# Patient Record
Sex: Male | Born: 1965 | Race: Black or African American | Hispanic: No | Marital: Single | Smoking: Current every day smoker
Health system: Southern US, Community
[De-identification: ages and names within clinical notes are randomized; demographics above are authoritative.]

## PROBLEM LIST (undated history)

## (undated) DIAGNOSIS — D649 Anemia, unspecified: Secondary | ICD-10-CM

## (undated) DIAGNOSIS — E119 Type 2 diabetes mellitus without complications: Secondary | ICD-10-CM

## (undated) DIAGNOSIS — I10 Essential (primary) hypertension: Secondary | ICD-10-CM

## (undated) DIAGNOSIS — L02419 Cutaneous abscess of limb, unspecified: Secondary | ICD-10-CM

## (undated) DIAGNOSIS — I679 Cerebrovascular disease, unspecified: Secondary | ICD-10-CM

## (undated) DIAGNOSIS — I2510 Atherosclerotic heart disease of native coronary artery without angina pectoris: Secondary | ICD-10-CM

## (undated) DIAGNOSIS — I351 Nonrheumatic aortic (valve) insufficiency: Secondary | ICD-10-CM

## (undated) DIAGNOSIS — Z720 Tobacco use: Secondary | ICD-10-CM

## (undated) DIAGNOSIS — F209 Schizophrenia, unspecified: Secondary | ICD-10-CM

## (undated) DIAGNOSIS — R001 Bradycardia, unspecified: Secondary | ICD-10-CM

## (undated) DIAGNOSIS — J449 Chronic obstructive pulmonary disease, unspecified: Secondary | ICD-10-CM

## (undated) DIAGNOSIS — E785 Hyperlipidemia, unspecified: Secondary | ICD-10-CM

## (undated) DIAGNOSIS — E669 Obesity, unspecified: Secondary | ICD-10-CM

## (undated) HISTORY — DX: Obesity, unspecified: E66.9

## (undated) HISTORY — DX: Essential (primary) hypertension: I10

## (undated) HISTORY — DX: Bradycardia, unspecified: R00.1

## (undated) HISTORY — DX: Tobacco use: Z72.0

## (undated) HISTORY — DX: Cutaneous abscess of limb, unspecified: L02.419

## (undated) HISTORY — DX: Schizophrenia, unspecified: F20.9

## (undated) HISTORY — DX: Cerebrovascular disease, unspecified: I67.9

## (undated) HISTORY — DX: Atherosclerotic heart disease of native coronary artery without angina pectoris: I25.10

## (undated) HISTORY — DX: Type 2 diabetes mellitus without complications: E11.9

## (undated) HISTORY — DX: Chronic obstructive pulmonary disease, unspecified: J44.9

## (undated) HISTORY — DX: Anemia, unspecified: D64.9

## (undated) HISTORY — DX: Nonrheumatic aortic (valve) insufficiency: I35.1

## (undated) HISTORY — DX: Hyperlipidemia, unspecified: E78.5

---
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
## 2006-12-24 ENCOUNTER — Inpatient Hospital Stay (HOSPITAL_COMMUNITY): Admission: EM | Admit: 2006-12-24 | Discharge: 2006-12-27 | Payer: Self-pay | Admitting: Emergency Medicine

## 2006-12-24 ENCOUNTER — Ambulatory Visit: Payer: Self-pay | Admitting: Internal Medicine

## 2006-12-25 ENCOUNTER — Ambulatory Visit: Payer: Self-pay | Admitting: Gastroenterology

## 2006-12-26 ENCOUNTER — Ambulatory Visit: Payer: Self-pay | Admitting: Gastroenterology

## 2007-12-02 ENCOUNTER — Emergency Department (HOSPITAL_COMMUNITY): Admission: EM | Admit: 2007-12-02 | Discharge: 2007-12-02 | Payer: Self-pay | Admitting: Emergency Medicine

## 2008-02-26 ENCOUNTER — Emergency Department (HOSPITAL_COMMUNITY): Admission: EM | Admit: 2008-02-26 | Discharge: 2008-02-26 | Payer: Self-pay | Admitting: Emergency Medicine

## 2008-07-28 ENCOUNTER — Inpatient Hospital Stay (HOSPITAL_COMMUNITY): Admission: EM | Admit: 2008-07-28 | Discharge: 2008-07-30 | Payer: Self-pay | Admitting: Cardiology

## 2008-07-28 ENCOUNTER — Encounter: Payer: Self-pay | Admitting: Cardiology

## 2009-04-04 DIAGNOSIS — L02419 Cutaneous abscess of limb, unspecified: Secondary | ICD-10-CM

## 2009-04-04 HISTORY — DX: Cutaneous abscess of limb, unspecified: L02.419

## 2009-05-13 ENCOUNTER — Inpatient Hospital Stay (HOSPITAL_COMMUNITY): Admission: EM | Admit: 2009-05-13 | Discharge: 2009-05-18 | Payer: Self-pay | Admitting: Emergency Medicine

## 2009-11-07 ENCOUNTER — Emergency Department (HOSPITAL_COMMUNITY): Admission: EM | Admit: 2009-11-07 | Discharge: 2009-11-07 | Payer: Self-pay | Admitting: Emergency Medicine

## 2009-11-09 ENCOUNTER — Inpatient Hospital Stay (HOSPITAL_COMMUNITY): Admission: EM | Admit: 2009-11-09 | Discharge: 2009-11-11 | Payer: Self-pay | Admitting: Emergency Medicine

## 2010-02-22 ENCOUNTER — Ambulatory Visit: Payer: Self-pay | Admitting: Cardiology

## 2010-04-30 ENCOUNTER — Ambulatory Visit (HOSPITAL_COMMUNITY)
Admission: RE | Admit: 2010-04-30 | Discharge: 2010-04-30 | Payer: Self-pay | Source: Home / Self Care | Attending: Pulmonary Disease | Admitting: Pulmonary Disease

## 2010-06-18 LAB — DIFFERENTIAL
Basophils Absolute: 0 10*3/uL (ref 0.0–0.1)
Basophils Absolute: 0 10*3/uL (ref 0.0–0.1)
Basophils Absolute: 0 10*3/uL (ref 0.0–0.1)
Basophils Relative: 0 % (ref 0–1)
Basophils Relative: 0 % (ref 0–1)
Basophils Relative: 0 % (ref 0–1)
Eosinophils Absolute: 0.4 10*3/uL (ref 0.0–0.7)
Eosinophils Absolute: 0.5 10*3/uL (ref 0.0–0.7)
Eosinophils Absolute: 0.5 10*3/uL (ref 0.0–0.7)
Eosinophils Relative: 2 % (ref 0–5)
Eosinophils Relative: 3 % (ref 0–5)
Eosinophils Relative: 4 % (ref 0–5)
Lymphocytes Relative: 11 % — ABNORMAL LOW (ref 12–46)
Lymphocytes Relative: 4 % — ABNORMAL LOW (ref 12–46)
Lymphocytes Relative: 8 % — ABNORMAL LOW (ref 12–46)
Lymphs Abs: 0.8 10*3/uL (ref 0.7–4.0)
Lymphs Abs: 1.2 10*3/uL (ref 0.7–4.0)
Lymphs Abs: 1.3 10*3/uL (ref 0.7–4.0)
Monocytes Absolute: 0.6 10*3/uL (ref 0.1–1.0)
Monocytes Absolute: 0.6 10*3/uL (ref 0.1–1.0)
Monocytes Absolute: 0.6 10*3/uL (ref 0.1–1.0)
Monocytes Relative: 3 % (ref 3–12)
Monocytes Relative: 3 % (ref 3–12)
Monocytes Relative: 5 % (ref 3–12)
Neutro Abs: 14.4 10*3/uL — ABNORMAL HIGH (ref 1.7–7.7)
Neutro Abs: 17.4 10*3/uL — ABNORMAL HIGH (ref 1.7–7.7)
Neutro Abs: 9.2 10*3/uL — ABNORMAL HIGH (ref 1.7–7.7)
Neutrophils Relative %: 80 % — ABNORMAL HIGH (ref 43–77)
Neutrophils Relative %: 86 % — ABNORMAL HIGH (ref 43–77)
Neutrophils Relative %: 91 % — ABNORMAL HIGH (ref 43–77)

## 2010-06-18 LAB — GLUCOSE, CAPILLARY
Glucose-Capillary: 102 mg/dL — ABNORMAL HIGH (ref 70–99)
Glucose-Capillary: 110 mg/dL — ABNORMAL HIGH (ref 70–99)
Glucose-Capillary: 121 mg/dL — ABNORMAL HIGH (ref 70–99)
Glucose-Capillary: 122 mg/dL — ABNORMAL HIGH (ref 70–99)
Glucose-Capillary: 126 mg/dL — ABNORMAL HIGH (ref 70–99)
Glucose-Capillary: 129 mg/dL — ABNORMAL HIGH (ref 70–99)
Glucose-Capillary: 152 mg/dL — ABNORMAL HIGH (ref 70–99)
Glucose-Capillary: 153 mg/dL — ABNORMAL HIGH (ref 70–99)
Glucose-Capillary: 162 mg/dL — ABNORMAL HIGH (ref 70–99)

## 2010-06-18 LAB — CBC
HCT: 37.5 % — ABNORMAL LOW (ref 39.0–52.0)
HCT: 38.6 % — ABNORMAL LOW (ref 39.0–52.0)
HCT: 38.7 % — ABNORMAL LOW (ref 39.0–52.0)
Hemoglobin: 12.5 g/dL — ABNORMAL LOW (ref 13.0–17.0)
Hemoglobin: 12.5 g/dL — ABNORMAL LOW (ref 13.0–17.0)
Hemoglobin: 12.6 g/dL — ABNORMAL LOW (ref 13.0–17.0)
MCH: 27.7 pg (ref 26.0–34.0)
MCH: 27.7 pg (ref 26.0–34.0)
MCH: 28.6 pg (ref 26.0–34.0)
MCHC: 32.4 g/dL (ref 30.0–36.0)
MCHC: 32.4 g/dL (ref 30.0–36.0)
MCHC: 33.3 g/dL (ref 30.0–36.0)
MCV: 85.5 fL (ref 78.0–100.0)
MCV: 85.5 fL (ref 78.0–100.0)
MCV: 85.8 fL (ref 78.0–100.0)
Platelets: 161 10*3/uL (ref 150–400)
Platelets: 177 10*3/uL (ref 150–400)
Platelets: 211 10*3/uL (ref 150–400)
RBC: 4.37 MIL/uL (ref 4.22–5.81)
RBC: 4.51 MIL/uL (ref 4.22–5.81)
RBC: 4.53 MIL/uL (ref 4.22–5.81)
RDW: 15.6 % — ABNORMAL HIGH (ref 11.5–15.5)
RDW: 15.8 % — ABNORMAL HIGH (ref 11.5–15.5)
RDW: 15.9 % — ABNORMAL HIGH (ref 11.5–15.5)
WBC: 11.4 10*3/uL — ABNORMAL HIGH (ref 4.0–10.5)
WBC: 16.8 10*3/uL — ABNORMAL HIGH (ref 4.0–10.5)
WBC: 19.2 10*3/uL — ABNORMAL HIGH (ref 4.0–10.5)

## 2010-06-18 LAB — BASIC METABOLIC PANEL
BUN: 10 mg/dL (ref 6–23)
BUN: 9 mg/dL (ref 6–23)
CO2: 23 mEq/L (ref 19–32)
CO2: 27 mEq/L (ref 19–32)
Calcium: 9.7 mg/dL (ref 8.4–10.5)
Calcium: 9.8 mg/dL (ref 8.4–10.5)
Chloride: 107 mEq/L (ref 96–112)
Chloride: 109 mEq/L (ref 96–112)
Creatinine, Ser: 1.1 mg/dL (ref 0.4–1.5)
Creatinine, Ser: 1.41 mg/dL (ref 0.4–1.5)
GFR calc Af Amer: >60 mL/min (ref >60)
GFR calc Af Amer: >60 mL/min (ref >60)
GFR calc non Af Amer: 54 mL/min — ABNORMAL LOW (ref >60)
GFR calc non Af Amer: >60 mL/min (ref >60)
Glucose, Bld: 103 mg/dL — ABNORMAL HIGH (ref 70–99)
Glucose, Bld: 118 mg/dL — ABNORMAL HIGH (ref 70–99)
Potassium: 3.8 mEq/L (ref 3.5–5.1)
Potassium: 3.9 mEq/L (ref 3.5–5.1)
Sodium: 138 mEq/L (ref 135–145)
Sodium: 140 mEq/L (ref 135–145)

## 2010-06-18 LAB — LITHIUM LEVEL: Lithium Lvl: 0.58 mEq/L — ABNORMAL LOW (ref 0.80–1.40)

## 2010-06-18 LAB — VANCOMYCIN, TROUGH: Vancomycin Tr: 10.3 ug/mL (ref 10.0–20.0)

## 2010-06-23 LAB — GLUCOSE, CAPILLARY
Glucose-Capillary: 130 mg/dL — ABNORMAL HIGH (ref 70–99)
Glucose-Capillary: 134 mg/dL — ABNORMAL HIGH (ref 70–99)
Glucose-Capillary: 148 mg/dL — ABNORMAL HIGH (ref 70–99)
Glucose-Capillary: 150 mg/dL — ABNORMAL HIGH (ref 70–99)
Glucose-Capillary: 161 mg/dL — ABNORMAL HIGH (ref 70–99)
Glucose-Capillary: 186 mg/dL — ABNORMAL HIGH (ref 70–99)
Glucose-Capillary: 188 mg/dL — ABNORMAL HIGH (ref 70–99)
Glucose-Capillary: 193 mg/dL — ABNORMAL HIGH (ref 70–99)
Glucose-Capillary: 202 mg/dL — ABNORMAL HIGH (ref 70–99)
Glucose-Capillary: 207 mg/dL — ABNORMAL HIGH (ref 70–99)
Glucose-Capillary: 221 mg/dL — ABNORMAL HIGH (ref 70–99)
Glucose-Capillary: 223 mg/dL — ABNORMAL HIGH (ref 70–99)
Glucose-Capillary: 226 mg/dL — ABNORMAL HIGH (ref 70–99)
Glucose-Capillary: 230 mg/dL — ABNORMAL HIGH (ref 70–99)
Glucose-Capillary: 272 mg/dL — ABNORMAL HIGH (ref 70–99)
Glucose-Capillary: 275 mg/dL — ABNORMAL HIGH (ref 70–99)
Glucose-Capillary: 277 mg/dL — ABNORMAL HIGH (ref 70–99)
Glucose-Capillary: 280 mg/dL — ABNORMAL HIGH (ref 70–99)
Glucose-Capillary: 283 mg/dL — ABNORMAL HIGH (ref 70–99)
Glucose-Capillary: 304 mg/dL — ABNORMAL HIGH (ref 70–99)
Glucose-Capillary: 327 mg/dL — ABNORMAL HIGH (ref 70–99)
Glucose-Capillary: 328 mg/dL — ABNORMAL HIGH (ref 70–99)
Glucose-Capillary: 331 mg/dL — ABNORMAL HIGH (ref 70–99)
Glucose-Capillary: 344 mg/dL — ABNORMAL HIGH (ref 70–99)
Glucose-Capillary: 344 mg/dL — ABNORMAL HIGH (ref 70–99)
Glucose-Capillary: 350 mg/dL — ABNORMAL HIGH (ref 70–99)
Glucose-Capillary: 353 mg/dL — ABNORMAL HIGH (ref 70–99)
Glucose-Capillary: 382 mg/dL — ABNORMAL HIGH (ref 70–99)
Glucose-Capillary: 489 mg/dL — ABNORMAL HIGH (ref 70–99)
Glucose-Capillary: >600 mg/dL (ref 70–99)
Glucose-Capillary: >600 mg/dL (ref 70–99)
Glucose-Capillary: >600 mg/dL (ref 70–99)
Glucose-Capillary: >600 mg/dL (ref 70–99)

## 2010-06-23 LAB — DIFFERENTIAL
Basophils Absolute: 0 10*3/uL (ref 0.0–0.1)
Basophils Absolute: 0 10*3/uL (ref 0.0–0.1)
Basophils Relative: 0 % (ref 0–1)
Basophils Relative: 0 % (ref 0–1)
Eosinophils Absolute: 0 10*3/uL (ref 0.0–0.7)
Eosinophils Absolute: 0.2 10*3/uL (ref 0.0–0.7)
Eosinophils Relative: 0 % (ref 0–5)
Eosinophils Relative: 2 % (ref 0–5)
Lymphocytes Relative: 18 % (ref 12–46)
Lymphocytes Relative: 9 % — ABNORMAL LOW (ref 12–46)
Lymphs Abs: 0.7 10*3/uL (ref 0.7–4.0)
Lymphs Abs: 1.7 10*3/uL (ref 0.7–4.0)
Monocytes Absolute: 0.3 10*3/uL (ref 0.1–1.0)
Monocytes Absolute: 0.7 10*3/uL (ref 0.1–1.0)
Monocytes Relative: 4 % (ref 3–12)
Monocytes Relative: 7 % (ref 3–12)
Neutro Abs: 6.9 10*3/uL (ref 1.7–7.7)
Neutro Abs: 6.9 10*3/uL (ref 1.7–7.7)
Neutrophils Relative %: 73 % (ref 43–77)
Neutrophils Relative %: 87 % — ABNORMAL HIGH (ref 43–77)

## 2010-06-23 LAB — BASIC METABOLIC PANEL
BUN: 27 mg/dL — ABNORMAL HIGH (ref 6–23)
BUN: 43 mg/dL — ABNORMAL HIGH (ref 6–23)
CO2: 25 mEq/L (ref 19–32)
CO2: 29 mEq/L (ref 19–32)
Calcium: 9.2 mg/dL (ref 8.4–10.5)
Calcium: 9.3 mg/dL (ref 8.4–10.5)
Chloride: 111 mEq/L (ref 96–112)
Chloride: 114 mEq/L — ABNORMAL HIGH (ref 96–112)
Creatinine, Ser: 1.7 mg/dL — ABNORMAL HIGH (ref 0.4–1.5)
Creatinine, Ser: 2.55 mg/dL — ABNORMAL HIGH (ref 0.4–1.5)
GFR calc Af Amer: 33 mL/min — ABNORMAL LOW (ref >60)
GFR calc Af Amer: 53 mL/min — ABNORMAL LOW (ref >60)
GFR calc non Af Amer: 27 mL/min — ABNORMAL LOW (ref >60)
GFR calc non Af Amer: 44 mL/min — ABNORMAL LOW (ref >60)
Glucose, Bld: 140 mg/dL — ABNORMAL HIGH (ref 70–99)
Glucose, Bld: 206 mg/dL — ABNORMAL HIGH (ref 70–99)
Potassium: 3.2 mEq/L — ABNORMAL LOW (ref 3.5–5.1)
Potassium: 3.6 mEq/L (ref 3.5–5.1)
Sodium: 142 mEq/L (ref 135–145)
Sodium: 149 mEq/L — ABNORMAL HIGH (ref 135–145)

## 2010-06-23 LAB — COMPREHENSIVE METABOLIC PANEL
ALT: 23 U/L (ref 0–53)
AST: 13 U/L (ref 0–37)
Albumin: 4.1 g/dL (ref 3.5–5.2)
Alkaline Phosphatase: 159 U/L — ABNORMAL HIGH (ref 39–117)
BUN: 41 mg/dL — ABNORMAL HIGH (ref 6–23)
CO2: 24 mEq/L (ref 19–32)
Calcium: 10.2 mg/dL (ref 8.4–10.5)
Chloride: 94 mEq/L — ABNORMAL LOW (ref 96–112)
Creatinine, Ser: 2.83 mg/dL — ABNORMAL HIGH (ref 0.4–1.5)
GFR calc Af Amer: 29 mL/min — ABNORMAL LOW (ref >60)
GFR calc non Af Amer: 24 mL/min — ABNORMAL LOW (ref >60)
Glucose, Bld: 1160 mg/dL (ref 70–99)
Potassium: 5.4 mEq/L — ABNORMAL HIGH (ref 3.5–5.1)
Sodium: 133 mEq/L — ABNORMAL LOW (ref 135–145)
Total Bilirubin: 1.3 mg/dL — ABNORMAL HIGH (ref 0.3–1.2)
Total Protein: 7.9 g/dL (ref 6.0–8.3)

## 2010-06-23 LAB — CK TOTAL AND CKMB (NOT AT ARMC)
CK, MB: 1.5 ng/mL (ref 0.3–4.0)
Relative Index: INVALID (ref 0.0–2.5)
Total CK: 90 U/L (ref 7–232)

## 2010-06-23 LAB — MRSA PCR SCREENING: MRSA by PCR: NEGATIVE

## 2010-06-23 LAB — CBC
HCT: 37.3 % — ABNORMAL LOW (ref 39.0–52.0)
HCT: 47 % (ref 39.0–52.0)
Hemoglobin: 12.2 g/dL — ABNORMAL LOW (ref 13.0–17.0)
Hemoglobin: 14.7 g/dL (ref 13.0–17.0)
MCHC: 31.3 g/dL (ref 30.0–36.0)
MCHC: 32.8 g/dL (ref 30.0–36.0)
MCV: 85.4 fL (ref 78.0–100.0)
MCV: 87 fL (ref 78.0–100.0)
Platelets: 182 10*3/uL (ref 150–400)
Platelets: 215 10*3/uL (ref 150–400)
RBC: 4.37 MIL/uL (ref 4.22–5.81)
RBC: 5.41 MIL/uL (ref 4.22–5.81)
RDW: 14.1 % (ref 11.5–15.5)
RDW: 14.3 % (ref 11.5–15.5)
WBC: 7.9 10*3/uL (ref 4.0–10.5)
WBC: 9.5 10*3/uL (ref 4.0–10.5)

## 2010-06-23 LAB — URINE CULTURE: Colony Count: 70000

## 2010-06-23 LAB — URINALYSIS, ROUTINE W REFLEX MICROSCOPIC
Bilirubin Urine: NEGATIVE
Glucose, UA: >1000 mg/dL — AB
Leukocytes, UA: NEGATIVE
Nitrite: NEGATIVE
Specific Gravity, Urine: 1.005 (ref 1.005–1.030)
Urobilinogen, UA: 0.2 mg/dL (ref 0.0–1.0)
pH: 5.5 (ref 5.0–8.0)

## 2010-06-23 LAB — URINE MICROSCOPIC-ADD ON

## 2010-06-23 LAB — CULTURE, BLOOD (ROUTINE X 2)
Culture: NO GROWTH
Culture: NO GROWTH
Report Status: 2142011
Report Status: 2142011

## 2010-06-23 LAB — APTT: aPTT: 24 seconds (ref 24–37)

## 2010-06-23 LAB — GLUCOSE, RANDOM
Glucose, Bld: 1086 mg/dL (ref 70–99)
Glucose, Bld: 902 mg/dL (ref 70–99)

## 2010-06-23 LAB — KETONES, QUALITATIVE

## 2010-06-23 LAB — LITHIUM LEVEL: Lithium Lvl: 1.66 mEq/L — ABNORMAL HIGH (ref 0.80–1.40)

## 2010-06-23 LAB — PROTIME-INR
INR: 1.09 (ref 0.00–1.49)
Prothrombin Time: 14 seconds (ref 11.6–15.2)

## 2010-06-23 LAB — TROPONIN I: Troponin I: 0.04 ng/mL (ref 0.00–0.06)

## 2010-07-14 LAB — PLATELET COUNT: Platelets: 206 10*3/uL (ref 150–400)

## 2010-07-14 LAB — COMPREHENSIVE METABOLIC PANEL
ALT: 29 U/L (ref 0–53)
AST: 130 U/L — ABNORMAL HIGH (ref 0–37)
Albumin: 3.6 g/dL (ref 3.5–5.2)
Alkaline Phosphatase: 104 U/L (ref 39–117)
BUN: 8 mg/dL (ref 6–23)
CO2: 27 mEq/L (ref 19–32)
Calcium: 9.8 mg/dL (ref 8.4–10.5)
Chloride: 99 mEq/L (ref 96–112)
Creatinine, Ser: 1.03 mg/dL (ref 0.4–1.5)
GFR calc Af Amer: >60 mL/min (ref >60)
GFR calc non Af Amer: >60 mL/min (ref >60)
Glucose, Bld: 145 mg/dL — ABNORMAL HIGH (ref 70–99)
Potassium: 3.7 mEq/L (ref 3.5–5.1)
Sodium: 135 mEq/L (ref 135–145)
Total Bilirubin: 0.8 mg/dL (ref 0.3–1.2)
Total Protein: 6.6 g/dL (ref 6.0–8.3)

## 2010-07-14 LAB — GLUCOSE, CAPILLARY
Glucose-Capillary: 107 mg/dL — ABNORMAL HIGH (ref 70–99)
Glucose-Capillary: 110 mg/dL — ABNORMAL HIGH (ref 70–99)
Glucose-Capillary: 123 mg/dL — ABNORMAL HIGH (ref 70–99)
Glucose-Capillary: 125 mg/dL — ABNORMAL HIGH (ref 70–99)
Glucose-Capillary: 132 mg/dL — ABNORMAL HIGH (ref 70–99)
Glucose-Capillary: 132 mg/dL — ABNORMAL HIGH (ref 70–99)
Glucose-Capillary: 132 mg/dL — ABNORMAL HIGH (ref 70–99)
Glucose-Capillary: 138 mg/dL — ABNORMAL HIGH (ref 70–99)

## 2010-07-14 LAB — BASIC METABOLIC PANEL
BUN: 10 mg/dL (ref 6–23)
BUN: 8 mg/dL (ref 6–23)
CO2: 29 mEq/L (ref 19–32)
CO2: 28 mEq/L (ref 19–32)
Calcium: 9.5 mg/dL (ref 8.4–10.5)
Calcium: 9.4 mg/dL (ref 8.4–10.5)
Chloride: 97 mEq/L (ref 96–112)
Chloride: 103 mEq/L (ref 96–112)
Creatinine, Ser: 1.35 mg/dL (ref 0.4–1.5)
Creatinine, Ser: 1.11 mg/dL (ref 0.4–1.5)
GFR calc Af Amer: >60 mL/min (ref >60)
GFR calc Af Amer: >60 mL/min (ref >60)
GFR calc non Af Amer: 57 mL/min — ABNORMAL LOW (ref >60)
GFR calc non Af Amer: >60 mL/min (ref >60)
Glucose, Bld: 183 mg/dL — ABNORMAL HIGH (ref 70–99)
Glucose, Bld: 116 mg/dL — ABNORMAL HIGH (ref 70–99)
Potassium: 3.1 mEq/L — ABNORMAL LOW (ref 3.5–5.1)
Potassium: 3.5 mEq/L (ref 3.5–5.1)
Sodium: 135 mEq/L (ref 135–145)
Sodium: 137 mEq/L (ref 135–145)

## 2010-07-14 LAB — CBC
HCT: 36.8 % — ABNORMAL LOW (ref 39.0–52.0)
HCT: 34.2 % — ABNORMAL LOW (ref 39.0–52.0)
Hemoglobin: 12.4 g/dL — ABNORMAL LOW (ref 13.0–17.0)
Hemoglobin: 11.2 g/dL — ABNORMAL LOW (ref 13.0–17.0)
MCHC: 33.8 g/dL (ref 30.0–36.0)
MCHC: 32.7 g/dL (ref 30.0–36.0)
MCV: 84.4 fL (ref 78.0–100.0)
MCV: 85.6 fL (ref 78.0–100.0)
Platelets: 205 10*3/uL (ref 150–400)
Platelets: 175 10*3/uL (ref 150–400)
RBC: 4.36 MIL/uL (ref 4.22–5.81)
RBC: 3.99 MIL/uL — ABNORMAL LOW (ref 4.22–5.81)
RDW: 16 % — ABNORMAL HIGH (ref 11.5–15.5)
RDW: 15.4 % (ref 11.5–15.5)
WBC: 8.6 10*3/uL (ref 4.0–10.5)
WBC: 8.7 10*3/uL (ref 4.0–10.5)

## 2010-07-14 LAB — DIFFERENTIAL
Basophils Absolute: 0 10*3/uL (ref 0.0–0.1)
Basophils Relative: 0 % (ref 0–1)
Eosinophils Absolute: 0.1 10*3/uL (ref 0.0–0.7)
Eosinophils Relative: 1 % (ref 0–5)
Lymphocytes Relative: 13 % (ref 12–46)
Lymphs Abs: 1.1 10*3/uL (ref 0.7–4.0)
Monocytes Absolute: 0.4 10*3/uL (ref 0.1–1.0)
Monocytes Relative: 4 % (ref 3–12)
Neutro Abs: 7.1 10*3/uL (ref 1.7–7.7)
Neutrophils Relative %: 82 % — ABNORMAL HIGH (ref 43–77)

## 2010-07-14 LAB — HEMOGLOBIN A1C
Hgb A1c MFr Bld: 6.4 % — ABNORMAL HIGH (ref 4.6–6.1)
Mean Plasma Glucose: 137 mg/dL

## 2010-07-14 LAB — LIPID PANEL
Cholesterol: 162 mg/dL (ref 0–200)
HDL: 28 mg/dL — ABNORMAL LOW (ref >39)
LDL Cholesterol: 83 mg/dL (ref 0–99)
Total CHOL/HDL Ratio: 5.8 RATIO
Triglycerides: 256 mg/dL — ABNORMAL HIGH (ref <150)
VLDL: 51 mg/dL — ABNORMAL HIGH (ref 0–40)

## 2010-07-14 LAB — POCT CARDIAC MARKERS
CKMB, poc: 13.5 ng/mL (ref 1.0–8.0)
CKMB, poc: 3.7 ng/mL (ref 1.0–8.0)
Myoglobin, poc: 457 ng/mL (ref 12–200)
Myoglobin, poc: >500 ng/mL (ref 12–200)
Troponin i, poc: 0.14 ng/mL — ABNORMAL HIGH (ref 0.00–0.09)
Troponin i, poc: 1.42 ng/mL (ref 0.00–0.09)

## 2010-07-14 LAB — CARDIAC PANEL(CRET KIN+CKTOT+MB+TROPI)
CK, MB: 66.9 ng/mL — ABNORMAL HIGH (ref 0.3–4.0)
Relative Index: 6.3 — ABNORMAL HIGH (ref 0.0–2.5)
Total CK: 1062 U/L — ABNORMAL HIGH (ref 7–232)
Troponin I: 17.38 ng/mL (ref 0.00–0.06)

## 2010-07-14 LAB — D-DIMER, QUANTITATIVE (NOT AT ARMC): D-Dimer, Quant: 0.55 ug/mL-FEU — ABNORMAL HIGH (ref 0.00–0.48)

## 2010-07-14 LAB — TSH: TSH: 0.901 u[IU]/mL (ref 0.350–4.500)

## 2010-07-14 LAB — APTT: aPTT: 39 seconds — ABNORMAL HIGH (ref 24–37)

## 2010-07-14 LAB — MAGNESIUM: Magnesium: 1.9 mg/dL (ref 1.5–2.5)

## 2010-07-14 LAB — PROTIME-INR
INR: 1.1 (ref 0.00–1.49)
Prothrombin Time: 14.1 seconds (ref 11.6–15.2)

## 2010-08-17 NOTE — Group Therapy Note (Signed)
NAMENOCHUM, FENTER            ACCOUNT NO.:  [id number]   MEDICAL RECORD NO.:  [id number]          PATIENT TYPE:  INP   LOCATION:  A304                          FACILITY:  [facility]   PHYSICIAN:  Edward L. Luan Pulling, M.D.DATE OF BIRTH:  02-14-1966   DATE OF PROCEDURE:  DATE OF DISCHARGE:                                 PROGRESS NOTE   [name] is better this morning.  He actually looked pretty good  yesterday, but he had more fever, and this morning in fact he still has  a temperature of 99.3, but he says he feels great and wants to go home.  I think his fevers part of the viral process with his mesenteric  adenitis.   EXAM:  This morning shows he is awake and alert, ambulating.  His chest is clear.  His heart is regular without gallop.  His abdomen is soft.  Temperature 99.3, pulse 98, respirations 20, blood pressure 167/99.   ASSESSMENT:  He has got mesenteric adenitis plan.   IS:  To discharge home.  Please see discharge summary for details.      Edward L. Luan Pulling, M.D.  Electronically Signed     ELH/MEDQ  D:  12/27/2006  T:  12/27/2006  Job:  [id number]

## 2010-08-17 NOTE — Group Therapy Note (Signed)
[name], [name]            ACCOUNT NO.:  [id number]   MEDICAL RECORD NO.:  [id number]          PATIENT TYPE:  INP   LOCATION:  A304                          FACILITY:  [facility]   PHYSICIAN:  Edward L. Luan Pulling, M.D.DATE OF BIRTH:  [birth year]   DATE OF PROCEDURE:  DATE OF DISCHARGE:                                 PROGRESS NOTE   PROBLEM:  Mesenteric adenitis, schizophrenia.   SUBJECTIVE:  [name] is about the same.  He has no new complaints.  He says he is hungry.   EXAM:  Shows temperature is 98.8, pulse 97, respirations 20, blood  pressure 156/80.  His abdomen is much softer than yesterday and he does not seem to be  nearly as tender.   ASSESSMENT:  He seems to be doing better.   PLAN:  To go ahead and try to increase his diet and follow.      Edward L. Luan Pulling, M.D.  Electronically Signed     ELH/MEDQ  D:  12/26/2006  T:  12/26/2006  Job:  [id number]

## 2010-08-17 NOTE — H&P (Signed)
NAMEANDRES, VEST NO.:  [id number]   MEDICAL RECORD NO.:  [id number]          PATIENT TYPE:  INP   LOCATION:  2904                         FACILITY:  [place]   PHYSICIAN:  [name], M.D.  DATE OF BIRTH:  [birth year]   DATE OF ADMISSION:  07/28/2008  DATE OF DISCHARGE:                              HISTORY & PHYSICAL   HISTORY OF PRESENT ILLNESS:  Mr. Whitebread is a 45 year old black male  with history of schizophrenia.  He is currently living in a group home.  He presented to the [facility] Emergency Department last night after  acute onset of mid substernal chest pain at 11:30 p.m.  Pain was  described as midsternal heaviness and pain without radiation.  It was  associated with shortness of breath and nausea, but no diaphoresis.  His  pain initially was relieved in the emergency department with IV  nitroglycerin, but has since recurred.  He now has grade 2/10 pain.  He  reports a 48-month history of similar substernal chest pain when he walks  to his mother's house.  This was relieved with rest.  He has no known  history of coronary disease.  He does have a history of hypertension.   His past medical history is significant for schizophrenia, history of  hypertension, and history of previous hernia repair.   He has no known allergies.   His current medications include lisinopril 20 mg per day,  hydrochlorothiazide 12.5 mg daily, metoprolol 50 mg per day, Zyprexa 20  mg per day, and lithium 300 mg per day.  He is also on Haldol at unknown  dose.   SOCIAL HISTORY:  The patient lives in a group home.  His mother lives  nearby.  He is single and has no children.  He is unemployed.  He smokes  one-half pack per day.  He denies alcohol use.   FAMILY HISTORY:  The patient is unable to give me his family history.   REVIEW OF SYSTEMS:  He states his appetite has been good.  He has had no  bowel or bladder complaints.  He denies any edema, orthopnea, or PND.  He has no bleeding history.  All other review of systems are reviewed  and are negative.   PHYSICAL EXAMINATION:  GENERAL:  The patient is an obese black male, in  no apparent distress.  VITAL SIGNS:  Blood pressure 146/72, pulse is 93 and regular.  He is  afebrile.  Sats 100% on 2 L nasal cannula.  HEENT:  He is normocephalic, atraumatic.  Pupils are equal, round,  reactive to light and accommodation.  Extraocular movements are full.  Sclerae clear.  Oropharynx is clear with poor dental hygiene.  NECK:  Supple without JVD, adenopathy, thyromegaly, or bruits.  LUNGS:  Clear.  CARDIAC:  Regular rate and rhythm with a grade 1/6 systolic murmur at  the left sternal border.  ABDOMEN:  Obese, soft, nontender.  Bowel sounds are positive.  There is  no hepatosplenomegaly.  Femoral and pedal pulses are 2+ and symmetric.  He has no lower extremity edema or cyanosis.  There  is no evidence of  phlebitis.  SKIN:  Warm and dry.  NEUROLOGIC:  The patient is alert and oriented to himself and place.  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  His cranial nerves II through  XII are intact.  He has no focal motor findings.   LABORATORY DATA:  ECG shows normal sinus rhythm with an LVH.  There is  slight ST elevation in V1 and V2, which may be repolarization  abnormality.  There is no serial change today.  His chest x-ray shows no  active disease.  His white count is 8600, hemoglobin 12.4, hematocrit  36.8, platelets 205,000.  Sodium is 135, potassium is 3.1, chloride 97,  CO2 of 29, BUN 10, creatinine 1.35, glucose is 183, calcium 9.5, D-dimer  was 0.55.  Point of care troponins were 0.14 and then 1.42.  CPK-MB was  3.7 and then 13.5.   IMPRESSION:  1. Non-ST-elevation myocardial infarction.  2. Hypertension.  3. Schizophrenia.  4. 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  5. Mild anemia.  6. Hypokalemia.   PLAN:  We will treat with aggressive medical therapy with aspirin,  statin therapy, beta blockers,  IV heparin, IV nitroglycerin, IV  Integrilin.  We will replete his potassium.  We will obtain serial  cardiac enzymes, lipid panel, and A1c.  We will also obtain an  echocardiogram.  Anticipate cardiac catheterization today.  I did try to  contact the patient's mother, but I was informed that she was en route  to the hospital and I anticipate we will need to have her consent for  procedure.  We will also treat with proton pump inhibitor  prophylactically.           ______________________________  [name], M.D.     PMJ/MEDQ  D:  07/28/2008  T:  07/28/2008  Job:  [id number]   cc:   Percell Miller L. Luan Pulling, M.D.

## 2010-08-17 NOTE — Cardiovascular Report (Signed)
[name], [name]            ACCOUNT NO.:  [id number]   MEDICAL RECORD NO.:  [id number]          PATIENT TYPE:  INP   LOCATION:  2904                         FACILITY:  [place]   PHYSICIAN:  [name], M.D.  DATE OF BIRTH:  [birth year]   DATE OF PROCEDURE:  07/28/2008  DATE OF DISCHARGE:                            CARDIAC CATHETERIZATION   INDICATIONS FOR PROCEDURE:  A 45 year old black male presented with non-  Q-wave myocardial infarction.  He has ongoing chest pain.   PROCEDURE:  Left heart catheterization, coronary, and left ventricular  angiography.  Equipment used 6-French 4 cm right and left Judkins  catheter, 6-French pigtail catheter, 6-French arterial sheath, 6-French  FL-4 guide, a 0.014 Prowater wire, a 2.5 x 12 mm apex balloon, a 3.5 x  18 mm multilength Vision stent, and a 3.75 x 15 mm Plummer Voyager balloon.  Access via the right femoral artery using standard Seldinger technique,  contrast 185 mL of Omnipaque.   HEMODYNAMIC DATA:  Aortic pressure is 136/80 with a mean of 103 mmHg,  left ventricle pressure was 133 with EDP of 14 mmHg.   MEDICATIONS:  The patient was on IV Integrilin.  At the beginning of  procedure, he was also on IV nitroglycerin.  He received a heparin bolus  of 5100 units IV.  He received 300 mg of Plavix p.o.  ACT was 229  seconds.   ANGIOGRAPHIC FINDINGS:  Left coronary artery rises and distributes  normally.  Left main coronary artery is normal.   Left anterior descending artery is a large vessel.  It is occluded in  the midvessel after the takeoff of first diagonal branch.  The LAD fills  by left-to-left collaterals.  The first diagonal branch is without  significant disease.   Left circumflex coronary artery has 20% irregularities in the proximal  and mid vessel.  The first and second obtuse marginal vessels are  without significant disease.   The right coronary artery rises and distributes normally.  It is a  dominant vessel.   There is a 40% narrowing in the midvessel after the  takeoff of the first RV marginal branch.  This is followed by another  30% lesion in the midvessel.   Left ventricular angiography was performed in the RAO view.  This  demonstrates normal left ventricular size and contractility with normal  systolic function.  Ejection fraction is estimated at 60%.  There are no  significant regional wall motion abnormalities.   We proceeded with intervention of the mid-LAD stenosis.  After initial  guide shots were obtained and the patient was anticoagulated, we were  able to cross this lesion easily with the wire.  We predilated the  occlusion with a 2.5-mm apex balloon up to 6 and an 8 atmospheres.  This  demonstrated focal lesion in the mid-LAD.  We then stented the lesion  using a 3.5 x 18 mm Multilink Vision stent.  This was deployed at 9 and  then 12 atmospheres with a stent balloon.  We then postdilated using a  3.75 x 15 mm Amador Voyager balloon up to 14 atmospheres.  This  yielded  excellent angiographic result with 0% residual stenosis and TIMI grade 3  flow.  The patient was painfree at the end of the procedure.   FINAL INTERPRETATION:  1. Single-vessel occlusive atherosclerotic coronary artery disease.  2. Normal left ventricular function.  3. Successful intracoronary stenting of the mid-LAD.   PLAN:  We will continue aspirin and Plavix.  We will continue at  Integrilin for 18 hours.           ______________________________  [name], M.D.     PMJ/MEDQ  D:  07/28/2008  T:  07/29/2008  Job:  [id number]   cc:   Percell Miller L. Luan Pulling, M.D.

## 2010-08-17 NOTE — Group Therapy Note (Signed)
[name], [name]            ACCOUNT NO.:  [id number]   MEDICAL RECORD NO.:  [id number]          PATIENT TYPE:  INP   LOCATION:  A304                          FACILITY:  [facility]   PHYSICIAN:  Edward L. Luan Pulling, M.D.DATE OF BIRTH:  [birth year]   DATE OF PROCEDURE:  DATE OF DISCHARGE:                                 PROGRESS NOTE   Mr. [name] is admitted with abdominal pain, probably mesenteric  adenitis. He has some right lower quadrant lymphadenopathy.  This  morning he says he has vomited but it is not totally clear because his  history is somewhat questionable.  When asked again if he vomited last  night he said no, he took a shower so I am not quite sure about whether  he actually did in fact vomit.  At any rate his exam this morning shows  that he is an obese male who is in no acute distress. His abdomen is  with right lower quadrant tenderness.  No rebound tenderness.  His CT  shows the mesenteric nodes as mentioned.  He has what appears to be a  urinary tract infection.  He may have some nausea. Has a long smoking  history and he says he is at least somewhat interested in having a  nicotine patch. His temperature is 98.8, pulse 91, respirations 20,  blood pressure 148/84.  His white count 5000 down from 11,300,  hemoglobin is 12.4, platelets 173.  His potassium is 3.1, BUN 13,  creatinine 1.4.   ASSESSMENT:  He has a UTI, probably mesenteric adenitis. He has a long  smoking history.  He has some hypokalemia.  Will need to replace his  potassium.  I am going to have him get a nicotine patch.  As far as what  to do with his mesenteric adenitis, he is being treated for pain.  I am  not real sure that he actually did vomit although he does give that  history and he has GI consult underway.      Edward L. Luan Pulling, M.D.  Electronically Signed     ELH/MEDQ  D:  12/25/2006  T:  12/25/2006  Job:  [id number]

## 2010-08-17 NOTE — Consult Note (Signed)
[name], Brown            ACCOUNT NO.:  [id number]   MEDICAL RECORD NO.:  [id number]          PATIENT TYPE:  INP   LOCATION:  A304                          FACILITY:  [facility]   PHYSICIAN:  R. Garfield Cornea, M.D. DATE OF BIRTH:  1965/07/04   DATE OF CONSULTATION:  12/24/2006  DATE OF DISCHARGE:                                 CONSULTATION   REQUESTING PHYSICIAN:  Marjean Donna, M.D.   REASON FOR CONSULTATION:  1. Right lower quadrant abdominal pain.  2. Urinary tract infection.   HISTORY OF PRESENT ILLNESS:  Mr.  [name]. Bosler is a very pleasant  45 year old African-American gentleman with schizophrenia, who lives at  one of the local nursing rest homes, who was brought to the hospital  last evening with dizziness and rigors.  He was evaluated in the  emergency room, was found to have a temperature of 104.7.  He was seen  by Dr. Fenton Foy.   He was evaluated in the emergency department, was found to have a  slightly elevated white count of 11.3, H&H 13.6 and 41.4, platelet count  189,000.  Urinalysis revealed 11-20 white cells, 3-6 RBCs , many  bacteria.  Nitrate and leukocyte esterase, however, were both negative.  A noncontrast CT demonstrated some nonspecific lymph nodes right lower  quadrant (I have reviewed the noncontrast study with Dr. Aletta Edouard.  He indeed has some nonspecific right lower quadrant nodes.  The right  colon and terminal ileum is not seen well.  The appendix was seen and  appeared to be grossly normal without any inflammatory changes.  Nothing  else acutely was seen to be an evolution.  He was admitted to the  hospital to Dr. Luan Pulling' service and has been started on antibiotics.  His T-max since admission is 102 last evening, is 100.5 this morning.  Since he was admitted he has had some non bloody emesis and some watery  diarrhea and he complains of localized right lower quadrant abdominal  pain.  He really denies any flank pain, increased  urinary frequency,  hesitancy or dysuria.  He denies ever having urinary tract infection  before, he denies ever having any bowel problems.  Up until this acute  illness, his oral intake was described as being very good.  There is no  family history of GI malignancy.   PAST MEDICAL HISTORY:  Significant for schizophrenia.   PAST SURGERIES:  Hernia repair.   MEDICATIONS:  As already stated.   ALLERGIES:  No known drug allergies.   FAMILY HISTORY:  No chronic GI or liver illness.   SOCIAL HISTORY:  1. Patient I believe lives in a group home.  2. No tobacco.  3. No alcohol.  4. He is single.  5. Has no children.  6. Has a supportive mother nearby.   REVIEW OF SYSTEMS:  Has not had any fever, headache, arthralgias.  No  reflux symptoms, odynophagia, dysphagia, early satiety.  Prior to recent  diarrhea, in the last 24 hours, has not had any bowel problems.  He  denies constipation.  No melena or rectal bleeding.   PHYSICAL EXAMINATION:  An obese 45 year old African-American gentleman  who is conversant but appears not to feel well at all.  Temp 100.5.  LAST VITAL SIGNS:  Pulse 111, respiratory rate 22, BP 145/83.  SKIN:  Warm and dry.  HEENT EXAM:  No scleral icterus, conjunctiva are pink.  ORAL CAVITY:  No lesions.  CHEST:  Lungs are clear to auscultation.  CARDIAC EXAM:  Regular rate and rhythm without murmur, gallop or rub.  ABDOMEN:  Obese, positive bowel sounds, no bruits.  The abdomen is soft.  He has localized tenderness right lower quadrant.  Does have guarding to  deep palpation equivocal to rebound tenderness.  I did not appreciate  any inguinal adenopathy.  He has no CVA tenderness.  EXTREMITY EXAM:  No edema.   LABORATORY DATA:  Blood cultures have been drawn.  Urine culture in  process.  Sodium 134, potassium 3.2, chloride 103, CO2 23, glucose 135,  BUN 12, creatinine 1.74.  Microscopic on UA:  WBCs 11 to 20, 3 to 6  RBCs, many bacteria.   IMPRESSION:  Mr.  [name] is a pleasant 45 year old African-  American male with schizophrenia admitted to the hospital with fever,  rigors, right lower quadrant abdominal pain.  Urinalysis suggests a  urinary tract infection, nitrate and WBC esterase, however, are  negative.  He has localized right lower quadrant abdominal pain with  guarding and equivocal rebound.  He also has had some nausea, vomiting  and just recently started to have some watery nonbloody diarrhea.  I  reviewed the CT with Dr. Aletta Edouard.  In fact, in contradistinction  to what is said in the CT report he was given some oral contrast but it  was a meager amount and did not really even make it into the distal  small bowel.  Does have some right lower quadrant inguinal adenopathy  which may have nothing to do with his acute illness.  The appendix was  seen and was felt to be grossly normal.  At this point in time certainly  a urinary tract infection may be contributing to his symptoms but we  would need to be concerned about the possibility of early appendicitis  in spite of yesterday's negative CT findings and even an enterocolitis  producing a pseudoappendicitis picture.  He has been started on Cipro  and Flagyl.   RECOMMENDATIONS:  1. Will pursue a repeat CT today.  I have talked to the radiology      folks and this will have to be non-IV contrast, but hopefully we      can give him a good dose of oral contrast to reassess his abdomen      and see if there has been any changes in the appendix, etc.  2. Will go ahead and obtain a hepatic profile, serum amylase, lipase      for completion.  He may eventually need stool studies.  3. Agree with Cipro and Flagyl for the time being.  4. Will hold off on surgical consultation for now.   I would like to thank Dr. Marjean Donna for letting me see this nice  gentleman today.      Bridgette Habermann, M.D.  Electronically Signed     RMR/MEDQ  D:  12/24/2006  T:  12/24/2006   Job:  [id number]   cc:   Percell Miller L. Luan Pulling, M.D.  Fax: [phone]   Angus G. Everette Rank, MD  Fax: [phone]   Carolee Rota, M.D.  [address]  [place], [place] [address]

## 2010-08-17 NOTE — Discharge Summary (Signed)
NAMEPOOKELA, SELLIN            ACCOUNT NO.:  [id number]   MEDICAL RECORD NO.:  [id number]          PATIENT TYPE:  INP   LOCATION:  2021                         FACILITY:  [place]   PHYSICIAN:  [name], M.D.  DATE OF BIRTH:  03-15-1966   DATE OF ADMISSION:  07/28/2008  DATE OF DISCHARGE:  07/30/2008                               DISCHARGE SUMMARY   HISTORY OF PRESENT ILLNESS:  [name] is a 45 year old black male  with history of schizophrenia and mild mental retardation.  He is  currently living in a group home.  He presented to [facility] Emergency  Department on July 27, 2008.  He complained of midsternal chest  heaviness and pain without radiation.  It is described as moderate in  intensity associated with shortness of breath and nausea, but no  diaphoresis.  His pain was relieved with IV nitroglycerin initially in  the emergency department.  On evaluation there, he was noted to have EKG  with LVH.  There was slight ST elevation in V1 and V2.  Since his pain  had resolved, the patient was treated medically and transferred to our  facility.  On arrival here, he had some recurrent chest pain grade 2 out  of 10.  The patient states she had been having a month of chest pain  similar to this when he exerted himself.  He has a history of  hypertension and tobacco abuse.  For details of his past medical  history, social history, family history, and physical exam, please see  admission history and physical.   LABORATORY DATA:  His ECG on admission showed normal sinus rhythm with  LVH.  There is mild ST elevation in V1 and V2.  There are no serial  changes.  His chest x-ray showed no active disease.  His white count  with 8600, hemoglobin 12.4, hematocrit 36.8, platelets 205,000.  D-dimer  was 0.55.  Initial point-of-care markers showed a troponin of 0.14 and  CPK-MB of 3.7.  Subsequent troponin went up to 1.42 with an MB of 13.5.  Cardiac panel on arrival here showed a CK  of 1062 with MB of 66.9,  troponin of 17.38.  Sodium is 135, potassium 3.1, chloride 97, CO2 of  29, glucose 183, BUN 10, creatinine 1.35.  Coags were normal.  Subsequent BUN came down to 8 with a creatinine of 1.3, and potassium  was repleted to 3.7, magnesium was 1.9.  TSH was 0.901.  A1c was 6.4.  Lipid panel showed a total cholesterol of 162, LDL of 83, triglycerides  256, and HDL of 28.   HOSPITAL COURSE:  The patient was admitted to our intensive care unit.  He was treated aggressively with aspirin, IV heparin, IV nitroglycerin,  and IV Integrilin.  He was taken that morning to the cardiac  catheterization laboratory.  Cardiac catheterization demonstrated total  occlusion of the mid LAD after the takeoff of the first diagonal branch.  He had mild nonobstructive disease in the other vessels.  Actually left  ventricular function looked quite good with good anterior wall motion  and ejection fraction estimated 60%.  We proceeded with stenting of the  mid LAD.  This was successfully stented using a 3.5 x 18 mm multilength  Vision stent.  This was postdilated with a 3.75 mm noncompliant balloon.  An excellent angiographic result was obtained with 0% residual stenosis.  Following procedure, the patient was treated with aspirin and Plavix.  He was started on statin therapy.  We increased his beta blocker  therapy.  He was continued on his ACE inhibitor and he was started on  potassium therapy.  His serial ECGs did demonstrate some development of  T-wave inversion in leads V1 through V3 with mild loss of R-wave.  Subsequent echocardiogram did demonstrate moderate anterior wall  hypokinesia with overall ejection fraction of 45-50%.  There was no  evidence of thrombus.  It was felt that his aortic valve was probably  bicuspid with mildly thickened leaflets.  There was mild stenosis with  estimated valve area of 1.62 sq cm.  His mean gradient was 9 mmHg.  The  peak gradient of 20 mmHg.  There  was mild-to-moderate aortic  insufficiency.  The patient was counseled on smoking cessation.  He was  transferred to telemetry and progressively ambulated.  He did develop  some pain in his left foot.  Examination showed no erythema or swelling.  The pain was localized to the dorsum of his foot to palpation.  It was  felt that this be best managed with analgesics.  The patient's blood  sugars came down to normal during his hospital stay and was 107 at the  time of discharge.  The patient was discharged home in stable condition  on July 30, 2008.   DISCHARGE DIAGNOSES:  1. Acute anterior myocardial infarction.  2. Hypertension.  3. Dyslipidemia.  4. Tobacco abuse.  5. Probable bicuspid aortic valve with mild aortic stenosis and mild      to moderate aortic insufficiency.  6. Schizophrenia.   DISCHARGE MEDICATIONS:  1. Lithium 300 mg twice a day.  2. Zyprexa 20 mg per day.  3. Haldol 150 mg IM every 4 weeks.  4. Lopressor 50 mg twice a day.  5. Lisinopril 20 mg daily.  6. Hydrochlorothiazide 12.5 mg daily.  7. Potassium 20 mEq per day.  8. Aspirin 325 mg per day.  9. Plavix 75 mg daily.  10.Pravastatin 40 mg per day.  11.Nitroglycerin 0.4 mg sublingual p.r.n.  12.Tylenol 650 mg p.r.n. pain.   He is instructed on smoking cessation.  Continue heart healthy, low-  sodium diet.  He is to return home to his group home.  We will schedule  followup appointment with Dr. Martinique in 2 weeks.  Discharge status is  improved.           ______________________________  [name], M.D.     PMJ/MEDQ  D:  07/30/2008  T:  07/30/2008  Job:  [id number]   cc:   Percell Miller L. Luan Pulling, M.D.

## 2010-08-17 NOTE — Discharge Summary (Signed)
NAMEKHAMERON, GRUENWALD            ACCOUNT NO.:  [id number]   MEDICAL RECORD NO.:  [id number]          PATIENT TYPE:  INP   LOCATION:  A304                          FACILITY:  [facility]   PHYSICIAN:  Edward L. Luan Pulling, M.D.DATE OF BIRTH:  09-18-1965   DATE OF ADMISSION:  12/23/2006  DATE OF DISCHARGE:  LH                               DISCHARGE SUMMARY   FINAL DISCHARGE DIAGNOSES:  1. Mesenteric adenitis.  2. Abdominal pain secondary to #1.  3. Schizophrenia.  4. History of hernia repair.  5. Hypokalemia.   Mr. Gaida is a 45 year old who came to the emergency room after  having had abdominal pain for about a week.  He did not have any nausea,  he may have had some vomiting but it is not totally clear, and may have  had some diarrhea.  His history is difficult to interpret because of his  schizophrenia.  He complained of abdominal pain and just felt bad, came  to the emergency room, where he was found to have a blood pressure of  145/90, pulse 109, temperature of 104.7 initially.  Exam showed that he  had right lower quadrant tenderness with the mesenteric lymph nodes.  His urine was suggestive of a urinary tract infection but had no growth.   HOSPITAL COURSE:  He was treated with intravenous antibiotics, had GI  consultation.  He had a CT, which showed lymph nodes in the abdomen.  He  improved over the next several days to the point that he was able to be  discharged home, he is actually in an assisted-living center, and he is  going to resume his previous Haldol.  He is going to be on Cipro 500 mg  b.i.d. x7 more days, Protonix 40 mg daily.      Edward L. Luan Pulling, M.D.  Electronically Signed     ELH/MEDQ  D:  12/27/2006  T:  12/27/2006  Job:  [id number]

## 2010-08-17 NOTE — H&P (Signed)
[name], [name]            ACCOUNT NO.:  [id number]   MEDICAL RECORD NO.:  [id number]          PATIENT TYPE:  INP   LOCATION:  A304                          FACILITY:  [facility]   PHYSICIAN:  Angus G. Everette Rank, MD   DATE OF BIRTH:  Jan 28, 1966   DATE OF ADMISSION:  12/23/2006  DATE OF DISCHARGE:  LH                              HISTORY & PHYSICAL   This is a 44 year old male patient of Dr. Luan Pulling who presented to the  ED with abdominal pain which has been present for approximately 1 week.  He had not apparently had any nausea or diarrhea.  The patient is a  schizophrenic obese male.  He was evaluated by Dr. Domingo Cocking in the ED.  He was noted to have right lower quadrant tenderness on examination.  A  CT of the pelvis was obtained.  There was no evidence of stones or  hydronephrosis noted.  Mildly prominent right lower lobe lymph nodes  were noted, questionable mesenteric adenitis.  Appendix appeared to be  normal.  Chemistries showed a sodium of 134, potassium 3.2, chloride  103, CO2 23, glucose 135, BUN 12, creatinine 1.74.  Urinalysis  essentially negative.  CBC:  WBC 11,300, with hemoglobin 13.6,  hematocrit of 41.4.  The patient did have purulent urine and was thought  to have urinary tract infection.  Subsequently was admitted.   SOCIAL HISTORY:  The patient does not drink or use drugs.  He does  smoke.   PAST MEDICAL HISTORY:  Schizophrenia.   PAST SURGICAL HISTORY:  Hernia repair.   ALLERGIES:  NO KNOWN DRUG ALLERGIES.   MEDICATIONS:  Haloperidol.   REVIEW OF SYSTEMS:  HEENT:  Negative.  CARDIOPULMONARY:  No chest pain  or cough.  GI:  The patient has had no nausea, but has had complaints of  pain in right lower quadrant.  No irregular bowel habits.   PHYSICAL EXAMINATION:  GENERAL:  Obese white male.  VITAL SIGNS:  Blood pressure 145/90, pulse 109, respirations 20,  temperature 104.7 initially and then 100.2.  HEENT:  Eyes:  PERRLA.  TMs negative.  Oropharynx  benign.  NECK:  Supple.  No JVD or thyroid abnormalities.  HEART:  Regular rhythm, no murmurs.  LUNGS:  Clear to P&A bilaterally.  No rales, rhonchi or wheezes.  ABDOMEN:  No palpable organs or masses but right lower quadrant  tenderness.  No rebound tenderness.  EXTREMITIES:  Normal.  GU:  Genitalia normal.   IMPRESSION:  This patient does have schizophrenia, obesity, right lower  quadrant pain with mesenteric nodes noted.  The patient does have a  urinary tract infection.   PLAN:  Continue IV Cipro, symptomatic measures, pain relief.  Cultures  in progress.  The patient may need GI evaluation.  Will obtain consult  from gastroenterology service.      Angus G. Everette Rank, MD  Electronically Signed     AGM/MEDQ  D:  12/24/2006  T:  12/24/2006  Job:  [id number]

## 2010-10-04 ENCOUNTER — Encounter: Payer: Self-pay | Admitting: *Deleted

## 2010-10-08 ENCOUNTER — Encounter: Payer: Self-pay | Admitting: Cardiology

## 2010-10-12 ENCOUNTER — Ambulatory Visit: Payer: Self-pay | Admitting: Cardiology

## 2010-10-14 ENCOUNTER — Encounter: Payer: Self-pay | Admitting: Cardiology

## 2010-12-16 ENCOUNTER — Encounter: Payer: Self-pay | Admitting: Cardiology

## 2010-12-16 ENCOUNTER — Ambulatory Visit (INDEPENDENT_AMBULATORY_CARE_PROVIDER_SITE_OTHER): Payer: Medicaid Other | Admitting: Cardiology

## 2010-12-16 DIAGNOSIS — I359 Nonrheumatic aortic valve disorder, unspecified: Secondary | ICD-10-CM

## 2010-12-16 DIAGNOSIS — E785 Hyperlipidemia, unspecified: Secondary | ICD-10-CM

## 2010-12-16 DIAGNOSIS — I2510 Atherosclerotic heart disease of native coronary artery without angina pectoris: Secondary | ICD-10-CM

## 2010-12-16 DIAGNOSIS — I351 Nonrheumatic aortic (valve) insufficiency: Secondary | ICD-10-CM | POA: Insufficient documentation

## 2010-12-16 DIAGNOSIS — I10 Essential (primary) hypertension: Secondary | ICD-10-CM

## 2010-12-16 DIAGNOSIS — F17200 Nicotine dependence, unspecified, uncomplicated: Secondary | ICD-10-CM

## 2010-12-16 NOTE — Patient Instructions (Signed)
Continue your current medications.  Stop smoking!!  I will see you again in 1 year.

## 2010-12-16 NOTE — Assessment & Plan Note (Signed)
He remains asymptomatic. He is on appropriate therapy with aspirin and metoprolol. He'll followup again in one year.

## 2010-12-16 NOTE — Assessment & Plan Note (Signed)
Routine blood work is followed by Dr. Luan Pulling.

## 2010-12-16 NOTE — Assessment & Plan Note (Signed)
Blood pressure is well-controlled today. 

## 2010-12-16 NOTE — Assessment & Plan Note (Signed)
Echocardiogram in May of 2011 showed mild to moderate aortic insufficiency with normal left ventricular size. He is asymptomatic. I would recommend a followup echocardiogram in a few years.

## 2010-12-16 NOTE — Assessment & Plan Note (Signed)
We spent 10 minutes discussing the importance of smoking cessation. He is not a candidate for Chantix or Wellbutrin because of his other medications. Nicotine replacement products would be possible but he is currently not willing to quit.

## 2010-12-16 NOTE — Progress Notes (Signed)
[name] Date of Birth: [birth year]   History of Present Illness: [name] is seen today for followup. He has had a prior non-Q-wave myocardial infarction April 2010 and had stenting of the mid LAD at that time with a bare-metal stent. He has done well since then without any recurrent symptoms of angina. He reports that he stays active and even does some jogging. He denies any chest pain, shortness of breath, or palpitations. He continues to smoke heavily. He reports his blood sugars have been doing well.  Current Outpatient Prescriptions on File Prior to Visit  Medication Sig Dispense Refill  . acetaminophen (TYLENOL) 325 MG tablet Take 650 mg by mouth as needed.        Marland Kitchen aspirin 325 MG tablet Take 325 mg by mouth daily.        . fish oil-omega-3 fatty acids 1000 MG capsule Take 1 g by mouth 4 (four) times daily.        . haloperidol decanoate (HALDOL DECANOATE) 100 MG/ML injection Inject 100 mg into the muscle every 14 (fourteen) days.        . hydrochlorothiazide (,MICROZIDE/HYDRODIURIL,) 12.5 MG capsule Take 12.5 mg by mouth daily.        . insulin glargine (LANTUS) 100 UNIT/ML injection Inject 20 Units into the skin 2 (two) times daily.        Marland Kitchen lisinopril (PRINIVIL,ZESTRIL) 40 MG tablet Take 40 mg by mouth daily.        Marland Kitchen lithium carbonate 300 MG capsule Take 300 mg  At hs      . metoprolol (LOPRESSOR) 50 MG tablet Take 100 mg by mouth 2 (two) times daily.       . multivitamin (THERAGRAN) per tablet Take 1 tablet by mouth daily.        . nitroGLYCERIN (NITROSTAT) 0.4 MG SL tablet Place 0.4 mg under the tongue every 5 (five) minutes as needed.        . potassium chloride SA (K-DUR,KLOR-CON) 20 MEQ tablet Take 20 mEq by mouth daily.        . pravastatin (PRAVACHOL) 40 MG tablet Take 40 mg by mouth daily.        Marland Kitchen zolpidem (AMBIEN) 5 MG tablet Take 10 mg by mouth at bedtime as needed.          No Known Allergies  Past Medical History  Diagnosis Date  . Coronary artery  disease     Est. EF of 60% -- Single-vessel occlusive atherosclerotic coronary artery disease -- Normal left ventricular function -- Successful intracoronary stenting of the mid-LAD -- [name] M. [name], M.D.  . Hypertension   . Hyperlipidemia   . Non-ST elevation myocardial infarction (NSTEMI)   . Schizophrenia     Schizophrenia/bipolar disease  . Mental retardation     Suspect mental retardation  . Mild anemia   . Diabetes mellitus   . Axillary abscess     right  . Obesity   . Dyslipidemia   . Tobacco dependence   . Aortic insufficiency     mild to moderate     Past Surgical History  Procedure Date  . Cardiac catheterization 07/28/2008    Est. EF of 60% -- Single-vessel occlusive atherosclerotic coronary artery disease -- Normal left ventricular function -- Successful intracoronary stenting of the mid-LAD -- [name] M. [name], M.D.  . Hernia repair     History  Smoking status  . Current Some Day Smoker -- 0.5 packs/day for 30 years  .  Types: Cigarettes  Smokeless tobacco  . Never Used    History  Alcohol Use No    Family History  Problem Relation Age of Onset  . Hypertension    . Arthritis      Review of Systems: The review of systems is positive for tobacco abuse.  All other systems were reviewed and are negative.  Physical Exam: BP 140/80  Pulse 50  Wt 220 lb (99.791 kg) He is a pleasant black male in no acute distress. His HEENT exam is unremarkable. He is missing multiple teeth. He has no JVD or bruits. Lungs are clear. Cardiac exam reveals a regular rate and rhythm with a soft grade 1/6 systolic murmur at the left sternal border. I do not hear a diastolic murmur or S3. Abdomen is soft and nontender. He has no edema. Pulses are 2+. Skin is warm and dry. He is alert and oriented x3. Cranial nerves II through XII are intact. LABORATORY DATA:   Assessment / Plan:

## 2011-01-13 LAB — HEPATIC FUNCTION PANEL
ALT: 21
AST: 32
Albumin: 3.4 — ABNORMAL LOW
Alkaline Phosphatase: 60
Bilirubin, Direct: 0.2
Indirect Bilirubin: 0.7
Total Bilirubin: 0.9
Total Protein: 7.2

## 2011-01-13 LAB — BASIC METABOLIC PANEL
BUN: 12
BUN: 13
BUN: 8
CO2: 23
CO2: 24
CO2: 24
Calcium: 8.9
Calcium: 9
Calcium: 9.1
Chloride: 103
Chloride: 104
Chloride: 111
Creatinine, Ser: 1.09
Creatinine, Ser: 1.41
Creatinine, Ser: 1.74 — ABNORMAL HIGH
GFR calc Af Amer: 52 — ABNORMAL LOW
GFR calc Af Amer: >60
GFR calc Af Amer: >60
GFR calc non Af Amer: 43 — ABNORMAL LOW
GFR calc non Af Amer: 55 — ABNORMAL LOW
GFR calc non Af Amer: >60
Glucose, Bld: 120 — ABNORMAL HIGH
Glucose, Bld: 135 — ABNORMAL HIGH
Glucose, Bld: 165 — ABNORMAL HIGH
Potassium: 3.1 — ABNORMAL LOW
Potassium: 3.2 — ABNORMAL LOW
Potassium: 3.6
Sodium: 134 — ABNORMAL LOW
Sodium: 136
Sodium: 139

## 2011-01-13 LAB — CULTURE, BLOOD (ROUTINE X 2)
Culture: NO GROWTH
Culture: NO GROWTH
Report Status: 9252008
Report Status: 9252008

## 2011-01-13 LAB — DIFFERENTIAL
Basophils Absolute: 0
Basophils Absolute: 0
Basophils Absolute: 0
Basophils Relative: 0
Basophils Relative: 0
Basophils Relative: 0
Eosinophils Absolute: 0
Eosinophils Absolute: 0
Eosinophils Absolute: 0
Eosinophils Relative: 0
Eosinophils Relative: 0
Eosinophils Relative: 1
Lymphocytes Relative: 7 — ABNORMAL LOW
Lymphocytes Relative: 8 — ABNORMAL LOW
Lymphocytes Relative: 9 — ABNORMAL LOW
Lymphs Abs: 0.4 — ABNORMAL LOW
Lymphs Abs: 0.5 — ABNORMAL LOW
Lymphs Abs: 0.8
Monocytes Absolute: 0.5
Monocytes Absolute: 0.6
Monocytes Absolute: 0.8 — ABNORMAL HIGH
Monocytes Relative: 11
Monocytes Relative: 15 — ABNORMAL HIGH
Monocytes Relative: 5
Neutro Abs: 10 — ABNORMAL HIGH
Neutro Abs: 4
Neutro Abs: 4.2
Neutrophils Relative %: 77
Neutrophils Relative %: 80 — ABNORMAL HIGH
Neutrophils Relative %: 88 — ABNORMAL HIGH

## 2011-01-13 LAB — URINE CULTURE
Colony Count: NO GROWTH
Culture: NO GROWTH

## 2011-01-13 LAB — URINALYSIS, ROUTINE W REFLEX MICROSCOPIC
Bilirubin Urine: NEGATIVE
Glucose, UA: NEGATIVE
Ketones, ur: NEGATIVE
Leukocytes, UA: NEGATIVE
Nitrite: NEGATIVE
Protein, ur: 30 — AB
Specific Gravity, Urine: 1.015
Urobilinogen, UA: 0.2
pH: 6

## 2011-01-13 LAB — LIPASE, BLOOD: Lipase: <10 — ABNORMAL LOW

## 2011-01-13 LAB — CBC
HCT: 33.4 — ABNORMAL LOW
HCT: 37 — ABNORMAL LOW
HCT: 41.4
Hemoglobin: 11.2 — ABNORMAL LOW
Hemoglobin: 12.4 — ABNORMAL LOW
Hemoglobin: 13.6
MCHC: 33
MCHC: 33.4
MCHC: 33.5
MCV: 82.9
MCV: 83.2
MCV: 83.8
Platelets: 173
Platelets: 183
Platelets: 189
RBC: 4.03 — ABNORMAL LOW
RBC: 4.44
RBC: 4.94
RDW: 15.2 — ABNORMAL HIGH
RDW: 15.3 — ABNORMAL HIGH
RDW: 15.4 — ABNORMAL HIGH
WBC: 11.3 — ABNORMAL HIGH
WBC: 5
WBC: 5.4

## 2011-01-13 LAB — URINE MICROSCOPIC-ADD ON

## 2011-01-13 LAB — AMYLASE: Amylase: 64

## 2011-01-29 ENCOUNTER — Encounter (HOSPITAL_COMMUNITY): Payer: Self-pay | Admitting: *Deleted

## 2011-01-29 ENCOUNTER — Emergency Department (HOSPITAL_COMMUNITY)
Admission: EM | Admit: 2011-01-29 | Discharge: 2011-01-29 | Disposition: A | Discharge status: Home / Self Care | Payer: Medicaid Other | Attending: Emergency Medicine | Admitting: Emergency Medicine

## 2011-01-29 ENCOUNTER — Emergency Department (HOSPITAL_COMMUNITY): Payer: Medicaid Other

## 2011-01-29 ENCOUNTER — Other Ambulatory Visit: Payer: Self-pay

## 2011-01-29 DIAGNOSIS — F79 Unspecified intellectual disabilities: Secondary | ICD-10-CM | POA: Insufficient documentation

## 2011-01-29 DIAGNOSIS — M791 Myalgia, unspecified site: Secondary | ICD-10-CM

## 2011-01-29 DIAGNOSIS — Z6830 Body mass index (BMI) 30.0-30.9, adult: Secondary | ICD-10-CM | POA: Insufficient documentation

## 2011-01-29 DIAGNOSIS — Z7901 Long term (current) use of anticoagulants: Secondary | ICD-10-CM | POA: Insufficient documentation

## 2011-01-29 DIAGNOSIS — E119 Type 2 diabetes mellitus without complications: Secondary | ICD-10-CM | POA: Insufficient documentation

## 2011-01-29 DIAGNOSIS — IMO0001 Reserved for inherently not codable concepts without codable children: Secondary | ICD-10-CM | POA: Insufficient documentation

## 2011-01-29 DIAGNOSIS — R079 Chest pain, unspecified: Secondary | ICD-10-CM | POA: Insufficient documentation

## 2011-01-29 DIAGNOSIS — R05 Cough: Secondary | ICD-10-CM | POA: Insufficient documentation

## 2011-01-29 DIAGNOSIS — R059 Cough, unspecified: Secondary | ICD-10-CM | POA: Insufficient documentation

## 2011-01-29 DIAGNOSIS — I10 Essential (primary) hypertension: Secondary | ICD-10-CM | POA: Insufficient documentation

## 2011-01-29 DIAGNOSIS — R61 Generalized hyperhidrosis: Secondary | ICD-10-CM | POA: Insufficient documentation

## 2011-01-29 DIAGNOSIS — M79609 Pain in unspecified limb: Secondary | ICD-10-CM | POA: Insufficient documentation

## 2011-01-29 DIAGNOSIS — E785 Hyperlipidemia, unspecified: Secondary | ICD-10-CM | POA: Insufficient documentation

## 2011-01-29 DIAGNOSIS — I2510 Atherosclerotic heart disease of native coronary artery without angina pectoris: Secondary | ICD-10-CM | POA: Insufficient documentation

## 2011-01-29 DIAGNOSIS — Z8673 Personal history of transient ischemic attack (TIA), and cerebral infarction without residual deficits: Secondary | ICD-10-CM | POA: Insufficient documentation

## 2011-01-29 DIAGNOSIS — I252 Old myocardial infarction: Secondary | ICD-10-CM | POA: Insufficient documentation

## 2011-01-29 DIAGNOSIS — F17200 Nicotine dependence, unspecified, uncomplicated: Secondary | ICD-10-CM | POA: Insufficient documentation

## 2011-01-29 DIAGNOSIS — E669 Obesity, unspecified: Secondary | ICD-10-CM | POA: Insufficient documentation

## 2011-01-29 LAB — DIFFERENTIAL
Basophils Absolute: 0 10*3/uL (ref 0.0–0.1)
Basophils Relative: 0 % (ref 0–1)
Eosinophils Absolute: 0.3 10*3/uL (ref 0.0–0.7)
Eosinophils Relative: 4 % (ref 0–5)
Lymphocytes Relative: 26 % (ref 12–46)
Lymphs Abs: 2.3 10*3/uL (ref 0.7–4.0)
Monocytes Absolute: 0.5 10*3/uL (ref 0.1–1.0)
Monocytes Relative: 6 % (ref 3–12)
Neutro Abs: 5.8 10*3/uL (ref 1.7–7.7)
Neutrophils Relative %: 65 % (ref 43–77)

## 2011-01-29 LAB — CBC
HCT: 36.3 % — ABNORMAL LOW (ref 39.0–52.0)
Hemoglobin: 11.8 g/dL — ABNORMAL LOW (ref 13.0–17.0)
MCH: 26.9 pg (ref 26.0–34.0)
MCHC: 32.5 g/dL (ref 30.0–36.0)
MCV: 82.7 fL (ref 78.0–100.0)
Platelets: 216 10*3/uL (ref 150–400)
RBC: 4.39 MIL/uL (ref 4.22–5.81)
RDW: 14.5 % (ref 11.5–15.5)
WBC: 9 10*3/uL (ref 4.0–10.5)

## 2011-01-29 LAB — URINALYSIS, ROUTINE W REFLEX MICROSCOPIC
Bilirubin Urine: NEGATIVE
Glucose, UA: NEGATIVE mg/dL
Hgb urine dipstick: NEGATIVE
Ketones, ur: NEGATIVE mg/dL
Leukocytes, UA: NEGATIVE
Nitrite: NEGATIVE
Protein, ur: NEGATIVE mg/dL
Specific Gravity, Urine: <1.005 — ABNORMAL LOW (ref 1.005–1.030)
Urobilinogen, UA: 0.2 mg/dL (ref 0.0–1.0)
pH: 6 (ref 5.0–8.0)

## 2011-01-29 LAB — CARDIAC PANEL(CRET KIN+CKTOT+MB+TROPI)
CK, MB: 1.2 ng/mL (ref 0.3–4.0)
Relative Index: INVALID (ref 0.0–2.5)
Total CK: 74 U/L (ref 7–232)
Troponin I: <0.3 ng/mL (ref <0.30)

## 2011-01-29 LAB — BASIC METABOLIC PANEL
BUN: 17 mg/dL (ref 6–23)
CO2: 26 mEq/L (ref 19–32)
Calcium: 9.8 mg/dL (ref 8.4–10.5)
Chloride: 102 mEq/L (ref 96–112)
Creatinine, Ser: 1.24 mg/dL (ref 0.50–1.35)
GFR calc Af Amer: 79 mL/min — ABNORMAL LOW (ref >90)
GFR calc non Af Amer: 68 mL/min — ABNORMAL LOW (ref >90)
Glucose, Bld: 131 mg/dL — ABNORMAL HIGH (ref 70–99)
Potassium: 3.2 mEq/L — ABNORMAL LOW (ref 3.5–5.1)
Sodium: 137 mEq/L (ref 135–145)

## 2011-01-29 LAB — RAPID URINE DRUG SCREEN, HOSP PERFORMED
Amphetamines: NOT DETECTED
Barbiturates: NOT DETECTED
Benzodiazepines: NOT DETECTED
Cocaine: NOT DETECTED
Opiates: NOT DETECTED
Tetrahydrocannabinol: NOT DETECTED

## 2011-01-29 NOTE — ED Notes (Signed)
Pt c/o pain to shoulders and left leg. Pt denies any injury.

## 2011-01-29 NOTE — ED Notes (Signed)
Pt with bradycardia at times. EKG ordered per protocol.

## 2011-01-29 NOTE — ED Notes (Signed)
IV per protocol.

## 2011-01-29 NOTE — ED Provider Notes (Signed)
Scribed for Jeff Blade, MD, the patient was seen in room [facility]12/[facility] . This chart was scribed by Glory Buff.   CSN: [id number] Arrival date & time: 01/29/2011  7:12 PM   First MD Initiated Contact with Patient 01/29/11 1921      Chief Complaint  Patient presents with  . Leg Pain  . Shoulder Pain    (Consider location/radiation/quality/duration/timing/severity/associated sxs/prior treatment) HPI [name] is a 45 y.o. male who presents to the Emergency Department complaining of diaphoresis and myalgias. Pt reports that he has been diaphoretic for two days since Thursday. Pt's diaphoresis is not worsened or improved by anything. Pt reports that he stopped sweating an hour ago. Pt also says he has pain to both his shoulders and left leg for two days since Thursday. His pain is described as sharp and constant since onset. Says pain is aggravated by changing position and ambulation. Pt denies treating pain with anything. Denies any recent trauma or injury.  Denies n/v/d, abdominal pain, emesis, back pain, arm pain. Reports some cough.  There are no other associated symptoms and no other alleviating or aggravating factors.   PCP Dr. Luan Pulling.  Past Medical History  Diagnosis Date  . Coronary artery disease     Est. EF of 60% -- Single-vessel occlusive atherosclerotic coronary artery disease -- Normal left ventricular function -- Successful intracoronary stenting of the mid-LAD -- [name], M.D.  . Hypertension   . Hyperlipidemia   . Non-ST elevation myocardial infarction (NSTEMI)   . Schizophrenia     Schizophrenia/bipolar disease  . Mental retardation     Suspect mental retardation  . Mild anemia   . Diabetes mellitus   . Axillary abscess     right  . Obesity   . Tobacco dependence   . Aortic insufficiency     mild to moderate   . Stroke     Past Surgical History  Procedure Date  . Cardiac catheterization 07/28/2008    Est. EF of 60% -- Single-vessel  occlusive atherosclerotic coronary artery disease -- Normal left ventricular function -- Successful intracoronary stenting of the mid-LAD -- [name], M.D.  . Hernia repair     Family History  Problem Relation Age of Onset  . Hypertension    . Arthritis      History  Substance Use Topics  . Smoking status: Current Some Day Smoker -- 0.5 packs/day for 30 years    Types: Cigarettes  . Smokeless tobacco: Never Used  . Alcohol Use: No    Review of Systems  Constitutional: Positive for diaphoresis.  Respiratory: Positive for cough.   Gastrointestinal: Negative for nausea, vomiting, abdominal pain, diarrhea and constipation.  Musculoskeletal: Positive for myalgias. Negative for back pain.  All other systems reviewed and are negative.    Allergies  Review of patient's allergies indicates no known allergies.  Home Medications   Current Outpatient Rx  Name Route Sig Dispense Refill  . ACETAMINOPHEN 325 MG PO TABS Oral Take 650 mg by mouth as needed.      . ASPIRIN 325 MG PO TABS Oral Take 325 mg by mouth daily.      . OMEGA-3 FATTY ACIDS 1000 MG PO CAPS Oral Take 1 g by mouth 4 (four) times daily.      Marland Kitchen HALOPERIDOL DECANOATE 100 MG/ML IM SOLN Intramuscular Inject 100 mg into the muscle every 14 (fourteen) days.      Marland Kitchen HYDROCHLOROTHIAZIDE 12.5 MG PO CAPS Oral Take 12.5 mg  by mouth daily.      . INSULIN GLARGINE 100 UNIT/ML [place] SOLN Subcutaneous Inject 20 Units into the skin 2 (two) times daily.      Marland Kitchen LISINOPRIL 40 MG PO TABS Oral Take 40 mg by mouth daily.      Marland Kitchen LITHIUM CARBONATE 300 MG PO CAPS  Take 300 mg  At hs    . METOPROLOL TARTRATE 50 MG PO TABS Oral Take 100 mg by mouth 2 (two) times daily.     . MULTIVITAMINS PO TABS Oral Take 1 tablet by mouth daily.      Marland Kitchen NITROGLYCERIN 0.4 MG SL SUBL Sublingual Place 0.4 mg under the tongue every 5 (five) minutes as needed.      Marland Kitchen POTASSIUM CHLORIDE CRYS CR 20 MEQ PO TBCR Oral Take 20 mEq by mouth daily.      Marland Kitchen PRAVASTATIN  SODIUM 40 MG PO TABS Oral Take 40 mg by mouth daily.      Marland Kitchen ZOLPIDEM TARTRATE 5 MG PO TABS Oral Take 10 mg by mouth at bedtime as needed.        BP 141/61  Pulse 57  Temp(Src) 98.8 F (37.1 C) (Oral)  Resp 20  Ht $R'5\' 10"'IF$  (1.778 m)  Wt 215 lb (97.523 kg)  BMI 30.85 kg/m2  SpO2 100%  Physical Exam  Nursing note and vitals reviewed. Constitutional: He is oriented to person, place, and time. He appears well-developed and well-nourished.  HENT:  Mouth/Throat: Oropharynx is clear and moist.  Eyes: Conjunctivae are normal. No scleral icterus.  Neck: Neck supple.  Cardiovascular: Normal rate and regular rhythm.   Murmur (2/6 systolic) heard. Pulmonary/Chest: Effort normal and breath sounds normal. No respiratory distress.  Abdominal: Soft. There is no tenderness.       No CVA tenderness  Musculoskeletal:       No spine tenderness.  Limited ROM left hip due to pain. Tenderness left thigh. Shoulders normal ROM and nontender  Neurological: He is alert and oriented to person, place, and time.  Skin: Skin is warm and dry.    ED Course  Procedures (including critical care time)  Date: 01/29/2011  Rate 53  Rhythm: normal sinus rhythm  QRS Axis: normal  Intervals: normal  ST/T Wave abnormalities: nonspecific ST changes and nonspecific T wave changes  Conduction Disutrbances:none  Narrative Interpretation: LVH  Old EKG Reviewed: unchanged  OTHER DATA REVIEWED: Nursing notes, vital signs, and past medical records reviewed.  DIAGNOSTIC STUDIES: Oxygen Saturation is 100% on room air, normal by my interpretation.     Labs Reviewed  CBC - Abnormal; Notable for the following:    Hemoglobin 11.8 (*)    HCT 36.3 (*)    All other components within normal limits  BASIC METABOLIC PANEL - Abnormal; Notable for the following:    Potassium 3.2 (*)    Glucose, Bld 131 (*)    GFR calc non Af Amer 68 (*)    GFR calc Af Amer 79 (*)    All other components within normal limits  URINALYSIS,  ROUTINE W REFLEX MICROSCOPIC - Abnormal; Notable for the following:    Specific Gravity, Urine <1.005 (*)    All other components within normal limits  DIFFERENTIAL  URINE RAPID DRUG SCREEN (HOSP PERFORMED)  CARDIAC PANEL(CRET KIN+CKTOT+MB+TROPI)   Dg Chest 2 View  01/29/2011  *RADIOLOGY REPORT*  Clinical Data: Chest pain and left leg pain for 2 days.  CHEST - 2 VIEW  Comparison: 11/11/2009  Findings: The heart size  and pulmonary vascularity are normal. The lungs appear clear and expanded without focal air space disease or consolidation. No blunting of the costophrenic angles.  No pneumothorax.  Degenerative changes in the thoracic spine.  IMPRESSION: No evidence of active pulmonary disease.  Original Report Authenticated By: Neale Burly, M.D.   10:32 PM Heart and xray look good. Pt can take tylenol if he feels achy or sweaty. Potassium a little low recommended he eat bananas for the next few days. If not better by Wednesday or Thursday f/u with PCP.  Pt is happy and smiling, reports he feels well and is ready to go home  1. Myalgia    MDM  Nonspecific pain and sweating; stable for d/c.   I personally performed the services described in this documentation, which was scribed in my presence. The recorded information has been reviewed and considered.        Jeff Blade, MD 01/29/11 [phone]

## 2011-01-29 NOTE — ED Notes (Signed)
Pt denies chest pain or pressure, denies SOB.

## 2011-03-02 ENCOUNTER — Encounter (HOSPITAL_COMMUNITY): Payer: Self-pay | Admitting: *Deleted

## 2011-03-02 ENCOUNTER — Emergency Department (HOSPITAL_COMMUNITY)
Admission: EM | Admit: 2011-03-02 | Discharge: 2011-03-02 | Disposition: A | Discharge status: Home / Self Care | Payer: Medicaid Other | Attending: Emergency Medicine | Admitting: Emergency Medicine

## 2011-03-02 DIAGNOSIS — F209 Schizophrenia, unspecified: Secondary | ICD-10-CM | POA: Insufficient documentation

## 2011-03-02 DIAGNOSIS — F79 Unspecified intellectual disabilities: Secondary | ICD-10-CM | POA: Insufficient documentation

## 2011-03-02 DIAGNOSIS — Z8673 Personal history of transient ischemic attack (TIA), and cerebral infarction without residual deficits: Secondary | ICD-10-CM | POA: Insufficient documentation

## 2011-03-02 DIAGNOSIS — F17200 Nicotine dependence, unspecified, uncomplicated: Secondary | ICD-10-CM | POA: Insufficient documentation

## 2011-03-02 DIAGNOSIS — I10 Essential (primary) hypertension: Secondary | ICD-10-CM | POA: Insufficient documentation

## 2011-03-02 DIAGNOSIS — I2510 Atherosclerotic heart disease of native coronary artery without angina pectoris: Secondary | ICD-10-CM | POA: Insufficient documentation

## 2011-03-02 DIAGNOSIS — I498 Other specified cardiac arrhythmias: Secondary | ICD-10-CM | POA: Insufficient documentation

## 2011-03-02 DIAGNOSIS — R42 Dizziness and giddiness: Secondary | ICD-10-CM | POA: Insufficient documentation

## 2011-03-02 DIAGNOSIS — R001 Bradycardia, unspecified: Secondary | ICD-10-CM

## 2011-03-02 DIAGNOSIS — E785 Hyperlipidemia, unspecified: Secondary | ICD-10-CM | POA: Insufficient documentation

## 2011-03-02 DIAGNOSIS — E119 Type 2 diabetes mellitus without complications: Secondary | ICD-10-CM | POA: Insufficient documentation

## 2011-03-02 DIAGNOSIS — Z794 Long term (current) use of insulin: Secondary | ICD-10-CM | POA: Insufficient documentation

## 2011-03-02 DIAGNOSIS — I252 Old myocardial infarction: Secondary | ICD-10-CM | POA: Insufficient documentation

## 2011-03-02 DIAGNOSIS — I359 Nonrheumatic aortic valve disorder, unspecified: Secondary | ICD-10-CM | POA: Insufficient documentation

## 2011-03-02 LAB — BASIC METABOLIC PANEL
BUN: 15 mg/dL (ref 6–23)
CO2: 25 mEq/L (ref 19–32)
Calcium: 10.4 mg/dL (ref 8.4–10.5)
Chloride: 103 mEq/L (ref 96–112)
Creatinine, Ser: 1.28 mg/dL (ref 0.50–1.35)
GFR calc Af Amer: 76 mL/min — ABNORMAL LOW (ref >90)
GFR calc non Af Amer: 65 mL/min — ABNORMAL LOW (ref >90)
Glucose, Bld: 88 mg/dL (ref 70–99)
Potassium: 3.9 mEq/L (ref 3.5–5.1)
Sodium: 136 mEq/L (ref 135–145)

## 2011-03-02 LAB — CBC
HCT: 40.3 % (ref 39.0–52.0)
Hemoglobin: 13.2 g/dL (ref 13.0–17.0)
MCH: 27.7 pg (ref 26.0–34.0)
MCHC: 32.8 g/dL (ref 30.0–36.0)
MCV: 84.5 fL (ref 78.0–100.0)
Platelets: 177 10*3/uL (ref 150–400)
RBC: 4.77 MIL/uL (ref 4.22–5.81)
RDW: 15.5 % (ref 11.5–15.5)
WBC: 5.9 10*3/uL (ref 4.0–10.5)

## 2011-03-02 LAB — LITHIUM LEVEL: Lithium Lvl: 0.83 mEq/L (ref 0.80–1.40)

## 2011-03-02 MED ORDER — METOPROLOL TARTRATE 100 MG PO TABS: 50.0000 mg | ORAL_TABLET | Freq: Two times a day (BID) | ORAL | Status: DC

## 2011-03-02 NOTE — ED Provider Notes (Signed)
History     CSN: [id number] Arrival date & time: 03/02/2011 12:20 PM   First MD Initiated Contact with Patient 03/02/11 1411      Chief Complaint  Patient presents with  . Bradycardia    (Consider location/radiation/quality/duration/timing/severity/associated sxs/prior treatment) The history is provided by the patient.   patient was sent in from [place] with bradycardia. He states he feels fine. He states that once.. One episode of dizziness, but has felt fine otherwise no chest pain. No fevers. No nausea vomiting diarrhea. No lightheadedness. His been eating well. He states no recent change his medications. No cough. No change with standing or walking.  Past Medical History  Diagnosis Date  . Coronary artery disease     Est. EF of 60% -- Single-vessel occlusive atherosclerotic coronary artery disease -- Normal left ventricular function -- Successful intracoronary stenting of the mid-LAD -- Peter M. [place], M.D.  . Hypertension   . Hyperlipidemia   . Non-ST elevation myocardial infarction (NSTEMI)   . Schizophrenia     Schizophrenia/bipolar disease  . Mental retardation     Suspect mental retardation  . Mild anemia   . Diabetes mellitus   . Axillary abscess     right  . Obesity   . Tobacco dependence   . Aortic insufficiency     mild to moderate   . Stroke     Past Surgical History  Procedure Date  . Cardiac catheterization 07/28/2008    Est. EF of 60% -- Single-vessel occlusive atherosclerotic coronary artery disease -- Normal left ventricular function -- Successful intracoronary stenting of the mid-LAD -- Peter M. [place], M.D.  . Hernia repair     Family History  Problem Relation Age of Onset  . Hypertension    . Arthritis      History  Substance Use Topics  . Smoking status: Current Some Day Smoker -- 0.5 packs/day for 30 years    Types: Cigarettes  . Smokeless tobacco: Never Used  . Alcohol Use: No      Review of Systems  Constitutional: Negative  for chills and fatigue.  Respiratory: Negative for cough and chest tightness.   Cardiovascular: Negative for chest pain.  Gastrointestinal: Negative for blood in stool.  Musculoskeletal: Negative for back pain.  Neurological: Positive for dizziness.  Psychiatric/Behavioral: Negative for confusion.    Allergies  Review of patient's allergies indicates no known allergies.  Home Medications   Current Outpatient Rx  Name Route Sig Dispense Refill  . AMLODIPINE BESYLATE 10 MG PO TABS Oral Take 10 mg by mouth daily.      . ASPIRIN 325 MG PO TABS Oral Take 325 mg by mouth daily.      Marland Kitchen DICLOFENAC SODIUM 1 % TD GEL Topical Apply 1 application topically 4 (four) times daily.      . OMEGA-3 FATTY ACIDS 1000 MG PO CAPS Oral Take 1 g by mouth 4 (four) times daily.      Marland Kitchen HALOPERIDOL DECANOATE 100 MG/ML IM SOLN Intramuscular Inject 100 mg into the muscle every 14 (fourteen) days.      Marland Kitchen HYDROCHLOROTHIAZIDE 12.5 MG PO CAPS Oral Take 12.5 mg by mouth daily.     . INSULIN GLARGINE 100 UNIT/ML [place] SOLN Subcutaneous Inject 20 Units into the skin 2 (two) times daily.      Marland Kitchen LISINOPRIL 40 MG PO TABS Oral Take 40 mg by mouth daily.      Marland Kitchen LITHIUM CARBONATE 300 MG PO CAPS Oral Take 900 mg by mouth  at bedtime.     . MULTIVITAMINS PO TABS Oral Take 1 tablet by mouth daily.      Marland Kitchen NAPROXEN SODIUM 220 MG PO TABS Oral Take 220 mg by mouth 2 (two) times daily at 10 AM and 5 PM.      . POTASSIUM CHLORIDE CRYS CR 20 MEQ PO TBCR Oral Take 20 mEq by mouth daily.      Marland Kitchen PRAVASTATIN SODIUM 40 MG PO TABS Oral Take 40 mg by mouth daily.      Marland Kitchen TEMAZEPAM 15 MG PO CAPS Oral Take 15 mg by mouth at bedtime.      Marland Kitchen METOPROLOL TARTRATE 100 MG PO TABS Oral Take 0.5 tablets (50 mg total) by mouth 2 (two) times daily. 10 tablet 0  . NITROGLYCERIN 0.4 MG SL SUBL Sublingual Place 0.4 mg under the tongue every 5 (five) minutes as needed. Chest pain      BP 110/60  Pulse 37  Temp(Src) 98.4 F (36.9 C) (Oral)  Resp 16  Ht $R'5\' 9"'jd$   (1.753 m)  Wt 219 lb (99.338 kg)  BMI 32.34 kg/m2  SpO2 98%  Physical Exam  Nursing note and vitals reviewed. Constitutional: He is oriented to person, place, and time. He appears well-developed and well-nourished.  HENT:  Head: Normocephalic and atraumatic.  Eyes: EOM are normal. Pupils are equal, round, and reactive to light.  Neck: Normal range of motion. Neck supple.  Cardiovascular: Regular rhythm and normal heart sounds.   No murmur heard.      Bradycardia  Pulmonary/Chest: Effort normal and breath sounds normal.  Abdominal: Soft. Bowel sounds are normal. He exhibits no distension and no mass. There is no tenderness. There is no rebound and no guarding.  Musculoskeletal: Normal range of motion. He exhibits no edema.  Neurological: He is alert and oriented to person, place, and time. No cranial nerve deficit.  Skin: Skin is warm and dry.  Psychiatric: He has a normal mood and affect.    ED Course  Procedures (including critical care time)  Labs Reviewed  BASIC METABOLIC PANEL - Abnormal; Notable for the following:    GFR calc non Af Amer 65 (*)    GFR calc Af Amer 76 (*)    All other components within normal limits  CBC  LITHIUM LEVEL   No results found.   1. Sinus bradycardia      Date: 03/02/2011  Rate: 37  Rhythm: sinus bradycardia  QRS Axis: normal  Intervals: normal  ST/T Wave abnormalities: nonspecific ST/T changes  Conduction Disutrbances:none  Narrative Interpretation: now more bradycardic  Old EKG Reviewed: changes noted    MDM  Patient presents with a sinus bradycardia in the upper 30s to low 40s. His blood pressure is maintained and is not orthostatic with it. He is on metoprolol and amlodipine. I discussed the case with Dr. Luan Pulling his primary care Dr. Metoprolol will be decreased to 50 mg twice a day. He will follow with him in a couple days.        Jasper Riling. Alvino Chapel, MD 03/02/11 1610

## 2011-03-02 NOTE — ED Notes (Signed)
Pt reports going to [facility] earlier to get an injection of Haldol and was told by the nurse that he was bradycardic.  Pt was referred here, but was given the injection anyway.  Pt in room on monitor.  Family at bedside.

## 2011-03-02 NOTE — ED Notes (Signed)
Ambulated patient. Pt's denies any sob or dizziness.

## 2011-03-02 NOTE — ED Notes (Signed)
Pt brought in by caregiver d/t low hr. Caregiver reports a hr of 40 this am.

## 2011-03-12 ENCOUNTER — Encounter (HOSPITAL_COMMUNITY): Payer: Self-pay | Admitting: *Deleted

## 2011-03-12 ENCOUNTER — Emergency Department (HOSPITAL_COMMUNITY)
Admission: EM | Admit: 2011-03-12 | Discharge: 2011-03-12 | Disposition: A | Discharge status: Home / Self Care | Payer: Medicaid Other | Attending: Emergency Medicine | Admitting: Emergency Medicine

## 2011-03-12 DIAGNOSIS — L0231 Cutaneous abscess of buttock: Secondary | ICD-10-CM | POA: Insufficient documentation

## 2011-03-12 DIAGNOSIS — Z794 Long term (current) use of insulin: Secondary | ICD-10-CM | POA: Insufficient documentation

## 2011-03-12 DIAGNOSIS — E785 Hyperlipidemia, unspecified: Secondary | ICD-10-CM | POA: Insufficient documentation

## 2011-03-12 DIAGNOSIS — I10 Essential (primary) hypertension: Secondary | ICD-10-CM | POA: Insufficient documentation

## 2011-03-12 DIAGNOSIS — I2510 Atherosclerotic heart disease of native coronary artery without angina pectoris: Secondary | ICD-10-CM | POA: Insufficient documentation

## 2011-03-12 DIAGNOSIS — Z8673 Personal history of transient ischemic attack (TIA), and cerebral infarction without residual deficits: Secondary | ICD-10-CM | POA: Insufficient documentation

## 2011-03-12 DIAGNOSIS — I359 Nonrheumatic aortic valve disorder, unspecified: Secondary | ICD-10-CM | POA: Insufficient documentation

## 2011-03-12 DIAGNOSIS — E119 Type 2 diabetes mellitus without complications: Secondary | ICD-10-CM | POA: Insufficient documentation

## 2011-03-12 DIAGNOSIS — L0291 Cutaneous abscess, unspecified: Secondary | ICD-10-CM

## 2011-03-12 DIAGNOSIS — I252 Old myocardial infarction: Secondary | ICD-10-CM | POA: Insufficient documentation

## 2011-03-12 DIAGNOSIS — F209 Schizophrenia, unspecified: Secondary | ICD-10-CM | POA: Insufficient documentation

## 2011-03-12 DIAGNOSIS — F17200 Nicotine dependence, unspecified, uncomplicated: Secondary | ICD-10-CM | POA: Insufficient documentation

## 2011-03-12 MED ORDER — HYDROCODONE-ACETAMINOPHEN 5-325 MG PO TABS: 1.0000 | ORAL_TABLET | ORAL | Status: AC | PRN

## 2011-03-12 MED ORDER — LIDOCAINE HCL (PF) 1 % IJ SOLN
INTRAMUSCULAR | Status: AC
  Administered 2011-03-12: 16:00:00
  Filled 2011-03-12: qty 5

## 2011-03-12 MED ORDER — DOXYCYCLINE HYCLATE 100 MG PO TABS
100.0000 mg | ORAL_TABLET | Freq: Once | ORAL | Status: AC
  Administered 2011-03-12: 100 mg via ORAL
  Filled 2011-03-12: qty 1

## 2011-03-12 MED ORDER — DOXYCYCLINE HYCLATE 100 MG PO CAPS: 100.0000 mg | ORAL_CAPSULE | Freq: Two times a day (BID) | ORAL | Status: AC

## 2011-03-12 NOTE — ED Provider Notes (Signed)
History     CSN: [id number] Arrival date & time: 03/12/2011  2:07 PM   First MD Initiated Contact with Patient 03/12/11 1447      Chief Complaint  Patient presents with  . Recurrent Skin Infections    (Consider location/radiation/quality/duration/timing/severity/associated sxs/prior treatment) HPI Comments: [name] is a 45 y.o. male who has a h/o skin infections presents to the Emergency Department complaining of a boil to his right buttock that began draining last night. He states he has had a raised tender area to the right buttock for `.5 weeks which opened yesterday and began draining a puslike material. Denies fever, chills, nausea, vomiting. He has taken no medicines. Sitting makes the area hurt worse.   Past Medical History  Diagnosis Date  . Coronary artery disease     Est. EF of 60% -- Single-vessel occlusive atherosclerotic coronary artery disease -- Normal left ventricular function -- Successful intracoronary stenting of the mid-LAD -- [name], M.D.  . Hypertension   . Hyperlipidemia   . Non-ST elevation myocardial infarction (NSTEMI)   . Schizophrenia     Schizophrenia/bipolar disease  . Mental retardation     Suspect mental retardation  . Mild anemia   . Diabetes mellitus   . Axillary abscess     right  . Obesity   . Tobacco dependence   . Aortic insufficiency     mild to moderate   . Stroke     Past Surgical History  Procedure Date  . Cardiac catheterization 07/28/2008    Est. EF of 60% -- Single-vessel occlusive atherosclerotic coronary artery disease -- Normal left ventricular function -- Successful intracoronary stenting of the mid-LAD -- [name], M.D.  . Hernia repair     Family History  Problem Relation Age of Onset  . Hypertension    . Arthritis      History  Substance Use Topics  . Smoking status: Current Some Day Smoker -- 0.5 packs/day for 30 years    Types: Cigarettes  . Smokeless tobacco: Never Used  .  Alcohol Use: No      Review of Systems 10 Systems reviewed and are negative for acute change except as noted in the HPI. Allergies  Review of patient's allergies indicates no known allergies.  Home Medications   Current Outpatient Rx  Name Route Sig Dispense Refill  . AMLODIPINE BESYLATE 10 MG PO TABS Oral Take 10 mg by mouth daily.      . ASPIRIN 325 MG PO TABS Oral Take 325 mg by mouth daily.      Marland Kitchen DICLOFENAC SODIUM 1 % TD GEL Topical Apply 1 application topically 4 (four) times daily.      . OMEGA-3 FATTY ACIDS 1000 MG PO CAPS Oral Take 1 g by mouth 4 (four) times daily.      Marland Kitchen HALOPERIDOL DECANOATE 100 MG/ML IM SOLN Intramuscular Inject 100 mg into the muscle every 14 (fourteen) days.      Marland Kitchen HYDROCHLOROTHIAZIDE 12.5 MG PO CAPS Oral Take 12.5 mg by mouth daily.     . INSULIN GLARGINE 100 UNIT/ML [place] SOLN Subcutaneous Inject 20 Units into the skin 2 (two) times daily.      Marland Kitchen LISINOPRIL 40 MG PO TABS Oral Take 40 mg by mouth daily.      Marland Kitchen LITHIUM CARBONATE 300 MG PO CAPS Oral Take 900 mg by mouth at bedtime.     Marland Kitchen METOPROLOL TARTRATE 100 MG PO TABS Oral Take 0.5 tablets (50 mg  total) by mouth 2 (two) times daily. 10 tablet 0  . MULTIVITAMINS PO TABS Oral Take 1 tablet by mouth daily.      Marland Kitchen NAPROXEN SODIUM 220 MG PO TABS Oral Take 220 mg by mouth 2 (two) times daily at 10 AM and 5 PM.      . NITROGLYCERIN 0.4 MG SL SUBL Sublingual Place 0.4 mg under the tongue every 5 (five) minutes as needed. Chest pain    . POTASSIUM CHLORIDE CRYS CR 20 MEQ PO TBCR Oral Take 20 mEq by mouth daily.      Marland Kitchen PRAVASTATIN SODIUM 40 MG PO TABS Oral Take 40 mg by mouth daily.      Marland Kitchen TEMAZEPAM 15 MG PO CAPS Oral Take 15 mg by mouth at bedtime.        BP 140/66  Pulse 55  Temp(Src) 98.4 F (36.9 C) (Oral)  Resp 18  Ht $R'5\' 9"'Wo$  (1.753 m)  Wt 218 lb (98.884 kg)  BMI 32.19 kg/m2  SpO2 100%  Physical Exam  Nursing note and vitals reviewed. Constitutional: He is oriented to person, place, and time. He  appears well-developed and well-nourished.  HENT:  Head: Normocephalic and atraumatic.  Eyes: EOM are normal.  Neck: Normal range of motion.  Cardiovascular: Normal rate, normal heart sounds and intact distal pulses.   Pulmonary/Chest: Effort normal and breath sounds normal.  Musculoskeletal: Normal range of motion.  Neurological: He is alert and oriented to person, place, and time.  Skin:       Abscess 2 cm x 1 cm to right buttock draining purulent material. Surrounding eryuthema and tenderness.    ED Course  Procedures (including critical care time) INCISION AND DRAINAGE Performed by: Prentiss Bells Consent: Verbal consent obtained. Risks and benefits: risks, benefits and alternatives were discussed Type: abscess  Body area: right buttock Anesthesia: local infiltration  Local anesthetic: lidocaine 1 w/o epinephrine  Anesthetic total:2 ml  Complexity: complex Blunt dissection to break up loculations  Drainage: purulent  Drainage amount: moderate Packing material: : none Patient tolerance: Patient tolerated the procedure well with no immediate complications.  New Prescriptions   DOXYCYCLINE (VIBRAMYCIN) 100 MG CAPSULE    Take 1 capsule (100 mg total) by mouth 2 (two) times daily.   HYDROCODONE-ACETAMINOPHEN (NORCO) 5-325 MG PER TABLET    Take 1 tablet by mouth every 4 (four) hours as needed for pain.     MDM  Patient with abscess to right buttock. I&D with moderate amount of purulent drainage. Placed on antibiotics. Pt stable in ED with no significant deterioration in condition.The patient appears reasonably screened and/or stabilized for discharge and I doubt any other medical condition or other [facility] requiring further screening, evaluation, or treatment in the ED at this time prior to discharge.   MDM Reviewed: nursing note and vitals        Gypsy Balsam. Olin Hauser, MD 03/12/11 [phone]

## 2011-03-12 NOTE — ED Notes (Signed)
Patient c/o boil to bottom for one week and an half

## 2011-04-04 ENCOUNTER — Ambulatory Visit: Payer: Medicaid Other | Admitting: Cardiology

## 2011-05-13 ENCOUNTER — Encounter: Payer: Self-pay | Admitting: Cardiology

## 2011-05-13 DIAGNOSIS — F209 Schizophrenia, unspecified: Secondary | ICD-10-CM | POA: Insufficient documentation

## 2011-05-13 DIAGNOSIS — Z720 Tobacco use: Secondary | ICD-10-CM | POA: Insufficient documentation

## 2011-05-13 DIAGNOSIS — E669 Obesity, unspecified: Secondary | ICD-10-CM | POA: Insufficient documentation

## 2011-05-13 DIAGNOSIS — E119 Type 2 diabetes mellitus without complications: Secondary | ICD-10-CM | POA: Insufficient documentation

## 2011-05-13 DIAGNOSIS — I2510 Atherosclerotic heart disease of native coronary artery without angina pectoris: Secondary | ICD-10-CM | POA: Insufficient documentation

## 2011-05-16 ENCOUNTER — Ambulatory Visit: Payer: Medicaid Other | Admitting: Cardiology

## 2011-05-18 ENCOUNTER — Encounter: Payer: Self-pay | Admitting: Cardiology

## 2011-05-18 ENCOUNTER — Telehealth: Payer: Self-pay | Admitting: Cardiology

## 2011-05-18 ENCOUNTER — Ambulatory Visit (INDEPENDENT_AMBULATORY_CARE_PROVIDER_SITE_OTHER): Payer: Medicaid Other | Admitting: Cardiology

## 2011-05-18 DIAGNOSIS — I10 Essential (primary) hypertension: Secondary | ICD-10-CM

## 2011-05-18 DIAGNOSIS — E785 Hyperlipidemia, unspecified: Secondary | ICD-10-CM

## 2011-05-18 DIAGNOSIS — I2510 Atherosclerotic heart disease of native coronary artery without angina pectoris: Secondary | ICD-10-CM

## 2011-05-18 DIAGNOSIS — I359 Nonrheumatic aortic valve disorder, unspecified: Secondary | ICD-10-CM

## 2011-05-18 DIAGNOSIS — I351 Nonrheumatic aortic (valve) insufficiency: Secondary | ICD-10-CM

## 2011-05-18 DIAGNOSIS — J310 Chronic rhinitis: Secondary | ICD-10-CM

## 2011-05-18 MED ORDER — FEXOFENADINE-PSEUDOEPHED ER 60-120 MG PO TB12: 1.0000 | ORAL_TABLET | Freq: Two times a day (BID) | ORAL | Status: DC | PRN

## 2011-05-18 MED ORDER — CLONIDINE HCL 0.1 MG/24HR TD PTWK: 1.0000 | MEDICATED_PATCH | TRANSDERMAL | Status: DC

## 2011-05-18 MED ORDER — NAPROXEN SODIUM 220 MG PO TABS: 220.0000 mg | ORAL_TABLET | ORAL | Status: DC | PRN

## 2011-05-18 MED ORDER — METOPROLOL TARTRATE 25 MG PO TABS: 25.0000 mg | ORAL_TABLET | Freq: Two times a day (BID) | ORAL | Status: DC

## 2011-05-18 NOTE — Progress Notes (Signed)
Patient ID: [name], male   DOB: [birth year], 46 y.o.   MRN: [id number] HPI: Return visit is requested by Dr. Luan Pulling for evaluation of bradycardia in this nice gentleman with known coronary artery disease.  Cardiology care was previously provided by Dr. Martinique, but it is more convenient for the patient to receive care in our [place] office.    Patient carries the diagnoses of schizophrenia and mental retardation and resides in a local rest home with good functional status.  He exercises regularly by doing calisthenics and denies dyspnea or chest discomfort.  Bradycardia has been noted during recent medical contacts, but no specific rates are provided.  As best I can tell, patient has no symptoms attributable to his heart rate, and no values lower than 50 bpm can be located.  Blood pressure control appears to be suboptimal.  Two values are available in his chart, both with systolics above 789.  Prior to Admission medications   Medication Sig Start Date End Date Taking? Authorizing Provider  amLODipine (NORVASC) 10 MG tablet Take 10 mg by mouth daily.     Yes Historical Provider, MD  aspirin 325 MG tablet Take 325 mg by mouth daily.     Yes Historical Provider, MD  diclofenac sodium (VOLTAREN) 1 % GEL Apply 1 application topically 4 (four) times daily.     Yes Historical Provider, MD  fish oil-omega-3 fatty acids 1000 MG capsule Take 1 g by mouth 4 (four) times daily.     Yes Historical Provider, MD  haloperidol decanoate (HALDOL DECANOATE) 100 MG/ML injection Inject 100 mg into the muscle every 14 (fourteen) days.     Yes Historical Provider, MD  insulin glargine (LANTUS) 100 UNIT/ML injection Inject 20 Units into the skin 2 (two) times daily.     Yes Historical Provider, MD  lisinopril (PRINIVIL,ZESTRIL) 40 MG tablet Take 40 mg by mouth daily.     Yes Historical Provider, MD  lithium carbonate 300 MG capsule Take 900 mg by mouth at bedtime.    Yes Historical Provider, MD  metoprolol  (LOPRESSOR) 25 MG tablet Take 1 tablet (25 mg total) by mouth 2 (two) times daily. 05/18/11  Yes Cristopher Estimable. Carlee Vonderhaar, MD  multivitamin [facility]) per tablet Take 1 tablet by mouth daily.     Yes Historical Provider, MD  naproxen sodium (ANAPROX) 220 MG tablet Take 1 tablet (220 mg total) by mouth as needed. 05/18/11  Yes Cristopher Estimable. Quinn Bartling, MD  nitroGLYCERIN (NITROSTAT) 0.4 MG SL tablet Place 0.4 mg under the tongue every 5 (five) minutes as needed. Chest pain   Yes Historical Provider, MD  potassium chloride SA (K-DUR,KLOR-CON) 20 MEQ tablet Take 20 mEq by mouth daily.     Yes Historical Provider, MD  pravastatin (PRAVACHOL) 40 MG tablet Take 40 mg by mouth daily.     Yes Historical Provider, MD  temazepam (RESTORIL) 15 MG capsule Take 15 mg by mouth at bedtime.     Yes Historical Provider, MD  cloNIDine (CATAPRES - DOSED IN MG/24 HR) 0.1 mg/24hr patch Place 1 patch (0.1 mg total) onto the skin every 7 (seven) days. 05/18/11 05/17/12  Cristopher Estimable. Lorene Klimas, MD  fexofenadine-pseudoephedrine (ALLEGRA-D 12 HOUR) 60-120 MG per tablet Take 1 tablet by mouth 2 (two) times daily as needed. 05/18/11 05/17/12  Cristopher Estimable. Lattie Haw, MD  No Known Allergies    Past medical history, social history, and family history reviewed and updated.  ROS: Requires corrective lenses for near vision; intermittent cough with a history  of mild asthma; history of ulcer disease; nocturia + urinary frequency; variable but increased weight ranging from 209-268 pounds.  All other systems reviewed and are negative.  PHYSICAL EXAM: BP 149/65  Pulse 54  Ht 5' 9.5" (1.765 m)  Wt 100.245 kg (221 lb)  BMI 32.17 kg/m2 ; weight increased 3 pounds over the past 2 months General-Well developed; no acute distress; poor dentition Body habitus-mildly overweight Neck-No JVD; no carotid bruits Lungs-clear lung fields; resonant to percussion Cardiovascular-normal PMI; normal S1 and S2; grade 2/6 basilar systolic ejection murmur Abdomen-normal bowel  sounds; soft and non-tender without masses or organomegaly Musculoskeletal-No deformities, no cyanosis or clubbing Neurologic-Normal cranial nerves; symmetric strength and tone; slowed mentation with pleasant affect Skin-Warm, no significant lesions Extremities-distal pulses intact; no edema  ASSESSMENT AND PLAN:  Jacqulyn Ducking, MD 05/18/2011 4:18 PM

## 2011-05-18 NOTE — Assessment & Plan Note (Signed)
No symptoms to suggest myocardial ischemia since MI in 2010.  We will continue to optimize control of cardiovascular risk factors.

## 2011-05-18 NOTE — Assessment & Plan Note (Addendum)
No murmur appreciated on examination.  LV size and function were normal when last assessed approximately 18 months ago.  A repeat echocardiogram will be appropriate within the next year or 2 to reassess the significance of this valvular abnormality.

## 2011-05-18 NOTE — Telephone Encounter (Signed)
NEED RX FOR [name]

## 2011-05-18 NOTE — Patient Instructions (Addendum)
**Note De-Identified  Obfuscation** Your physician has recommended you make the following change in your medication: decrease Metoprolol to 258 mg twice daily, change Naproxen to as needed, stop taking Hydrochlorothiazide, start taking Allegra D OTC twice daily as needed and start wearing Clonidine 0.1 patch once weekly  Your physician has requested that you regularly monitor and record your blood pressure readings at home. Please use the same machine at the same time of day to check your readings and record them to bring to your follow-up visit. Please check blood pressure X 2 weekly in rest home   Your physician recommends that you schedule a follow-up appointment in: 3 months

## 2011-05-18 NOTE — Assessment & Plan Note (Signed)
Total and LDL cholesterol were extremely low when last assessed earlier this month.  Current dose of pravastatin is acceptable.

## 2011-05-18 NOTE — Assessment & Plan Note (Addendum)
Blood pressure control is mildly suboptimal.  Naprosyn may be contributing to this problem and will be changed to be administered on an as-needed basis.  Hydrochlorothiazide can cause problems with lithium and will be discontinued.  Metoprolol is causing bradycardia, and the dose will be decreased.  Clonidine TTS level one will be added to his regime with monitoring by the staff at his group home.

## 2011-05-20 ENCOUNTER — Other Ambulatory Visit: Payer: Self-pay

## 2011-05-20 MED ORDER — FEXOFENADINE-PSEUDOEPHED ER 60-120 MG PO TB12: 1.0000 | ORAL_TABLET | Freq: Two times a day (BID) | ORAL | Status: DC | PRN

## 2011-06-09 ENCOUNTER — Other Ambulatory Visit: Payer: Self-pay | Admitting: Cardiology

## 2011-08-16 ENCOUNTER — Ambulatory Visit: Payer: Medicaid Other | Admitting: Cardiology

## 2011-08-19 ENCOUNTER — Encounter: Payer: Self-pay | Admitting: Cardiology

## 2011-08-19 ENCOUNTER — Ambulatory Visit (INDEPENDENT_AMBULATORY_CARE_PROVIDER_SITE_OTHER): Payer: Medicaid Other | Admitting: Cardiology

## 2011-08-19 VITALS — BP 148/82 | HR 66 | Resp 18 | Ht 69.0 in | Wt 226.0 lb

## 2011-08-19 DIAGNOSIS — I10 Essential (primary) hypertension: Secondary | ICD-10-CM

## 2011-08-19 MED ORDER — ASPIRIN EC 81 MG PO TBEC: 81.0000 mg | DELAYED_RELEASE_TABLET | Freq: Every day | ORAL | Status: DC

## 2011-08-19 NOTE — Patient Instructions (Signed)
**Note De-Identified  Obfuscation** Your physician has recommended you make the following change in your medication: start taking Aspirin 81 mg daily  Your physician recommends that you schedule a follow-up appointment in: 1 month

## 2011-08-19 NOTE — Progress Notes (Signed)
Patient ID: RC AMISON, male   DOB: 05/05/1965, 46 y.o.   MRN: [id number]  Patient returns to the office for reassessment of hypertension, but has not modified his treatment regime as previously recommended.  We have discussed this with the care facility in which he resides, have asked that they maintain the therapy we have prescribed, and will reassess this nice gentleman in one month.

## 2011-08-19 NOTE — Progress Notes (Signed)
Name: [name]    DOB: 1965-08-27  Age: 46 y.o.  MR#: [id number]       PCP:  Alonza Bogus, MD, MD      Insurance: $[email or web address]$ @   CC:    Chief Complaint  Patient presents with  . Appointment    no complaints -meds/list    VS BP 148/82  Pulse 66  Resp 18  Ht $R'5\' 9"'LT$  (1.753 m)  Wt 226 lb (102.513 kg)  BMI 33.37 kg/m2  Weights Current Weight  08/19/11 226 lb (102.513 kg)  05/18/11 221 lb (100.245 kg)  03/12/11 218 lb (98.884 kg)    Blood Pressure  BP Readings from Last 3 Encounters:  08/19/11 148/82  05/18/11 149/65  03/12/11 140/66     Admit date:  (Not on file) Last encounter with RMR:  06/09/2011   Allergy No Known Allergies  Current Outpatient Prescriptions  Medication Sig Dispense Refill  . amLODipine (NORVASC) 10 MG tablet Take 10 mg by mouth daily.        . diclofenac sodium (VOLTAREN) 1 % GEL Apply 1 application topically 4 (four) times daily.        . fexofenadine-pseudoephedrine (ALLEGRA-D 12 HOUR) 60-120 MG per tablet Take 1 tablet by mouth 2 (two) times daily as needed.  60 tablet  3  . fish oil-omega-3 fatty acids 1000 MG capsule Take 1 g by mouth 4 (four) times daily.        . haloperidol decanoate (HALDOL DECANOATE) 100 MG/ML injection Inject 100 mg into the muscle every 14 (fourteen) days.        . insulin glargine (LANTUS) 100 UNIT/ML injection Inject 20 Units into the skin 2 (two) times daily.        Marland Kitchen lisinopril (PRINIVIL,ZESTRIL) 40 MG tablet Take 40 mg by mouth daily.        Marland Kitchen lithium carbonate 300 MG capsule Take 900 mg by mouth at bedtime.       . metoprolol (LOPRESSOR) 25 MG tablet Take 1 tablet (25 mg total) by mouth 2 (two) times daily.  60 tablet  3  . multivitamin (THERAGRAN) per tablet Take 1 tablet by mouth daily.        . naproxen sodium (ANAPROX) 220 MG tablet Take 1 tablet (220 mg total) by mouth as needed.  30 tablet  3  . potassium chloride SA (K-DUR,KLOR-CON) 20 MEQ tablet Take 20 mEq by mouth daily.        . pravastatin  (PRAVACHOL) 40 MG tablet Take 40 mg by mouth daily.        . temazepam (RESTORIL) 15 MG capsule Take 15 mg by mouth at bedtime.          Discontinued Meds:    Medications Discontinued During This Encounter  Medication Reason  . aspirin 325 MG tablet Error  . CATAPRES-TTS-1 0.1 MG/24HR patch Error  . nitroGLYCERIN (NITROSTAT) 0.4 MG SL tablet Error    Patient Active Problem List  Diagnoses  . Hypertension  . Hyperlipidemia  . Aortic insufficiency  . Arteriosclerotic cardiovascular disease (ASCVD)  . Schizophrenia  . Obesity  . Tobacco abuse  . Diabetes mellitus, type 2  . Rhinitis    LABS No visits with results within 3 Month(s) from this visit. Latest known visit with results is:  Admission on 03/02/2011, Discharged on 03/02/2011  Component Date Value  . WBC 03/02/2011 5.9   . RBC 03/02/2011 4.77   . Hemoglobin 03/02/2011 13.2   . HCT 03/02/2011 40.3   .  MCV 03/02/2011 84.5   . [facility] 03/02/2011 27.7   . MCHC 03/02/2011 32.8   . RDW 03/02/2011 15.5   . Platelets 03/02/2011 177   . Sodium 03/02/2011 136   . Potassium 03/02/2011 3.9   . Chloride 03/02/2011 103   . CO2 03/02/2011 25   . Glucose, Bld 03/02/2011 88   . BUN 03/02/2011 15   . Creatinine, Ser 03/02/2011 1.28   . Calcium 03/02/2011 10.4   . GFR calc non Af Amer 03/02/2011 65*  . GFR calc Af Amer 03/02/2011 76*  . Lithium Lvl 03/02/2011 0.83      Results for this Opt Visit:     Results for orders placed during the hospital encounter of 03/02/11  CBC      Component Value Range   WBC 5.9  4.0 - 10.5 (K/uL)   RBC 4.77  4.22 - 5.81 (MIL/uL)   Hemoglobin 13.2  13.0 - 17.0 (g/dL)   HCT 40.3  39.0 - 52.0 (%)   MCV 84.5  78.0 - 100.0 (fL)   MCH 27.7  26.0 - 34.0 (pg)   MCHC 32.8  30.0 - 36.0 (g/dL)   RDW 15.5  11.5 - 15.5 (%)   Platelets 177  150 - 400 (K/uL)  BASIC METABOLIC PANEL      Component Value Range   Sodium 136  135 - 145 (mEq/L)   Potassium 3.9  3.5 - 5.1 (mEq/L)   Chloride 103  96 - 112  (mEq/L)   CO2 25  19 - 32 (mEq/L)   Glucose, Bld 88  70 - 99 (mg/dL)   BUN 15  6 - 23 (mg/dL)   Creatinine, Ser 1.28  0.50 - 1.35 (mg/dL)   Calcium 10.4  8.4 - 10.5 (mg/dL)   GFR calc non Af Amer 65 (*) >90 (mL/min)   GFR calc Af Amer 76 (*) >90 (mL/min)  LITHIUM LEVEL      Component Value Range   Lithium Lvl 0.83  0.80 - 1.40 (mEq/L)    EKG Orders placed during the hospital encounter of 03/02/11  . ED EKG  . ED EKG  . EKG     Prior Assessment and Plan Problem List as of 08/19/2011          Cardiology Problems   Hypertension   Last Assessment & Plan Note   05/18/2011 Office Visit Addendum 05/22/2011  8:52 AM by Yehuda Savannah, MD    Blood pressure control is mildly suboptimal.  Naprosyn may be contributing to this problem and will be changed to be administered on an as-needed basis.  Hydrochlorothiazide can cause problems with lithium and will be discontinued.  Metoprolol is causing bradycardia, and the dose will be decreased.  Clonidine TTS level one will be added to his regime with monitoring by the staff at his group home.    Hyperlipidemia   Last Assessment & Plan Note   05/18/2011 Office Visit Signed 05/18/2011  4:30 PM by Yehuda Savannah, MD    Total and LDL cholesterol were extremely low when last assessed earlier this month.  Current dose of pravastatin is acceptable.    Aortic insufficiency   Last Assessment & Plan Note   05/18/2011 Office Visit Addendum 05/18/2011  4:29 PM by Yehuda Savannah, MD    No murmur appreciated on examination.  LV size and function were normal when last assessed approximately 18 months ago.  A repeat echocardiogram will be appropriate within the next year or 2 to reassess  the significance of this valvular abnormality.    Arteriosclerotic cardiovascular disease (ASCVD)   Last Assessment & Plan Note   05/18/2011 Office Visit Signed 05/18/2011  4:28 PM by Yehuda Savannah, MD    No symptoms to suggest myocardial ischemia since MI in 2010.  We  will continue to optimize control of cardiovascular risk factors.      Other   Schizophrenia   Obesity   Tobacco abuse   Diabetes mellitus, type 2   Rhinitis       Imaging: No results found.   FRS Calculation: Score not calculated. Missing: Total Cholesterol

## 2011-09-19 ENCOUNTER — Ambulatory Visit (INDEPENDENT_AMBULATORY_CARE_PROVIDER_SITE_OTHER): Payer: Medicaid Other | Admitting: Cardiology

## 2011-09-19 ENCOUNTER — Encounter: Payer: Self-pay | Admitting: Cardiology

## 2011-09-19 VITALS — BP 137/77 | HR 63 | Resp 18 | Ht 69.0 in | Wt 224.0 lb

## 2011-09-19 DIAGNOSIS — F17200 Nicotine dependence, unspecified, uncomplicated: Secondary | ICD-10-CM

## 2011-09-19 DIAGNOSIS — I10 Essential (primary) hypertension: Secondary | ICD-10-CM

## 2011-09-19 DIAGNOSIS — Z720 Tobacco use: Secondary | ICD-10-CM

## 2011-09-19 MED ORDER — BUPROPION HCL ER (SMOKING DET) 150 MG PO TB12: 150.0000 mg | ORAL_TABLET | Freq: Two times a day (BID) | ORAL | Status: DC

## 2011-09-19 NOTE — Progress Notes (Deleted)
Name: [name]    DOB: 1965/05/10  Age: 46 y.o.  MR#: [id number]       PCP:  Alonza Bogus, MD      Insurance: [email or web address] @   CC:    Chief Complaint  Patient presents with  . Appointment    no complaints -meds/list    VS BP 137/77  Pulse 63  Resp 18  Ht $R'5\' 9"'no$  (1.753 m)  Wt 224 lb (101.606 kg)  BMI 33.08 kg/m2  Weights Current Weight  09/19/11 224 lb (101.606 kg)  08/19/11 226 lb (102.513 kg)  05/18/11 221 lb (100.245 kg)    Blood Pressure  BP Readings from Last 3 Encounters:  09/19/11 137/77  08/19/11 148/82  05/18/11 149/65     Admit date:  (Not on file) Last encounter with RMR:  08/19/2011   Allergy No Known Allergies  Current Outpatient Prescriptions  Medication Sig Dispense Refill  . AIMSCO INSULIN SYR ULTRA THIN 29G X 1/2" 0.5 ML MISC       . amLODipine (NORVASC) 10 MG tablet Take 10 mg by mouth daily.        Marland Kitchen aspirin EC 81 MG tablet Take 1 tablet (81 mg total) by mouth daily.  90 tablet  1  . cloNIDine (CATAPRES - DOSED IN MG/24 HR) 0.1 mg/24hr patch       . diclofenac sodium (VOLTAREN) 1 % GEL Apply 1 application topically 4 (four) times daily.        . fexofenadine-pseudoephedrine (ALLEGRA-D 12 HOUR) 60-120 MG per tablet Take 1 tablet by mouth 2 (two) times daily as needed.  60 tablet  3  . fish oil-omega-3 fatty acids 1000 MG capsule Take 1 g by mouth 4 (four) times daily.        . haloperidol decanoate (HALDOL DECANOATE) 100 MG/ML injection Inject 100 mg into the muscle every 14 (fourteen) days.        . insulin glargine (LANTUS) 100 UNIT/ML injection Inject 20 Units into the skin 2 (two) times daily.        Marland Kitchen lisinopril (PRINIVIL,ZESTRIL) 40 MG tablet Take 40 mg by mouth daily.        Marland Kitchen lithium carbonate 300 MG capsule Take 900 mg by mouth at bedtime.       . metoprolol (LOPRESSOR) 25 MG tablet Take 1 tablet (25 mg total) by mouth 2 (two) times daily.  60 tablet  3  . multivitamin (THERAGRAN) per tablet Take 1 tablet by mouth daily.        .  naproxen sodium (ANAPROX) 220 MG tablet Take 1 tablet (220 mg total) by mouth as needed.  30 tablet  3  . potassium chloride SA (K-DUR,KLOR-CON) 20 MEQ tablet Take 20 mEq by mouth daily.        . pravastatin (PRAVACHOL) 40 MG tablet Take 40 mg by mouth daily.        . temazepam (RESTORIL) 15 MG capsule Take 15 mg by mouth at bedtime.          Discontinued Meds:   There are no discontinued medications.  Patient Active Problem List  Diagnosis  . Hypertension  . Hyperlipidemia  . Aortic insufficiency  . Arteriosclerotic cardiovascular disease (ASCVD)  . Schizophrenia  . Obesity  . Tobacco abuse  . Diabetes mellitus, type 2  . Rhinitis    LABS No visits with results within 3 Month(s) from this visit. Latest known visit with results is:  Admission on 03/02/2011, Discharged on 03/02/2011  Component  Date Value  . WBC 03/02/2011 5.9   . RBC 03/02/2011 4.77   . Hemoglobin 03/02/2011 13.2   . HCT 03/02/2011 40.3   . MCV 03/02/2011 84.5   . [facility] 03/02/2011 27.7   . MCHC 03/02/2011 32.8   . RDW 03/02/2011 15.5   . Platelets 03/02/2011 177   . Sodium 03/02/2011 136   . Potassium 03/02/2011 3.9   . Chloride 03/02/2011 103   . CO2 03/02/2011 25   . Glucose, Bld 03/02/2011 88   . BUN 03/02/2011 15   . Creatinine, Ser 03/02/2011 1.28   . Calcium 03/02/2011 10.4   . GFR calc non Af Amer 03/02/2011 65*  . GFR calc Af Amer 03/02/2011 76*  . Lithium Lvl 03/02/2011 0.83      Results for this Opt Visit:     Results for orders placed during the hospital encounter of 03/02/11  CBC      Component Value Range   WBC 5.9  4.0 - 10.5 K/uL   RBC 4.77  4.22 - 5.81 MIL/uL   Hemoglobin 13.2  13.0 - 17.0 g/dL   HCT 40.3  39.0 - 52.0 %   MCV 84.5  78.0 - 100.0 fL   MCH 27.7  26.0 - 34.0 pg   MCHC 32.8  30.0 - 36.0 g/dL   RDW 15.5  11.5 - 15.5 %   Platelets 177  150 - 400 K/uL  BASIC METABOLIC PANEL      Component Value Range   Sodium 136  135 - 145 mEq/L   Potassium 3.9  3.5 - 5.1 mEq/L    Chloride 103  96 - 112 mEq/L   CO2 25  19 - 32 mEq/L   Glucose, Bld 88  70 - 99 mg/dL   BUN 15  6 - 23 mg/dL   Creatinine, Ser 1.28  0.50 - 1.35 mg/dL   Calcium 10.4  8.4 - 10.5 mg/dL   GFR calc non Af Amer 65 (*) >90 mL/min   GFR calc Af Amer 76 (*) >90 mL/min  LITHIUM LEVEL      Component Value Range   Lithium Lvl 0.83  0.80 - 1.40 mEq/L    EKG Orders placed during the hospital encounter of 03/02/11  . ED EKG  . ED EKG  . EKG     Prior Assessment and Plan Problem List as of 09/19/2011            Cardiology Problems   Hypertension   Last Assessment & Plan Note   05/18/2011 Office Visit Addendum 05/22/2011  8:52 AM by Yehuda Savannah, MD    Blood pressure control is mildly suboptimal.  Naprosyn may be contributing to this problem and will be changed to be administered on an as-needed basis.  Hydrochlorothiazide can cause problems with lithium and will be discontinued.  Metoprolol is causing bradycardia, and the dose will be decreased.  Clonidine TTS level one will be added to his regime with monitoring by the staff at his group home.    Hyperlipidemia   Last Assessment & Plan Note   05/18/2011 Office Visit Signed 05/18/2011  4:30 PM by Yehuda Savannah, MD    Total and LDL cholesterol were extremely low when last assessed earlier this month.  Current dose of pravastatin is acceptable.    Aortic insufficiency   Last Assessment & Plan Note   05/18/2011 Office Visit Addendum 05/18/2011  4:29 PM by Yehuda Savannah, MD    No murmur appreciated on  examination.  LV size and function were normal when last assessed approximately 18 months ago.  A repeat echocardiogram will be appropriate within the next year or 2 to reassess the significance of this valvular abnormality.    Arteriosclerotic cardiovascular disease (ASCVD)   Last Assessment & Plan Note   05/18/2011 Office Visit Signed 05/18/2011  4:28 PM by Yehuda Savannah, MD    No symptoms to suggest myocardial ischemia since MI in  2010.  We will continue to optimize control of cardiovascular risk factors.      Other   Schizophrenia   Obesity   Tobacco abuse   Diabetes mellitus, type 2   Rhinitis       Imaging: No results found.   FRS Calculation: Score not calculated. Missing: Total Cholesterol

## 2011-09-19 NOTE — Assessment & Plan Note (Signed)
Patient is doing well and is currently asymptomatic from a cardiopulmonary standpoint despite significant bronchospasm apparent on exam.  Control of blood pressure is improved.  Current medication will be continued.

## 2011-09-19 NOTE — Patient Instructions (Addendum)
Your physician recommends that you schedule a follow-up appointment in: 6 months  Your physician has recommended you make the following change in your medication: Start Zyban as directed for smoking cessation

## 2011-09-19 NOTE — Assessment & Plan Note (Signed)
Patient is interested in a quit attempt.  Prescription for Zyban given to assist him in this endeavor.

## 2011-09-19 NOTE — Progress Notes (Signed)
Patient ID: [name], male   DOB: 12-21-1965, 46 y.o.   MRN: [id number]  HPI: Scheduled return visit for this pleasant gentleman with a history of hypertension, aortic valve disease and coronary artery disease.  Since his last visit at which time multiple changes were initiated in his medical therapy, he has felt fine.  Blood pressure has been monitored, but patient is not certain of the actual measurements.  As far as he knows, values have been good.  He has noted no adverse effects of his medications.  He continues to smoke cigarettes, which he has done for 35 years.  On one occasion, he stopped for a year and would like to do so again.  Prior to Admission medications   Medication Sig Start Date End Date Taking? Authorizing Provider  AIMSCO INSULIN SYR ULTRA THIN 29G X 1/2" 0.5 ML MISC  06/29/11  Yes Historical Provider, MD  amLODipine (NORVASC) 10 MG tablet Take 10 mg by mouth daily.     Yes Historical Provider, MD  aspirin EC 81 MG tablet Take 1 tablet (81 mg total) by mouth daily. 08/19/11 08/18/12 Yes Yehuda Savannah, MD  cloNIDine (CATAPRES - DOSED IN MG/24 HR) 0.1 mg/24hr patch  06/09/11  Yes Historical Provider, MD  diclofenac sodium (VOLTAREN) 1 % GEL Apply 1 application topically 4 (four) times daily.     Yes Historical Provider, MD  fexofenadine-pseudoephedrine (ALLEGRA-D 12 HOUR) 60-120 MG per tablet Take 1 tablet by mouth 2 (two) times daily as needed. 05/20/11 05/19/12 Yes Yehuda Savannah, MD  fish oil-omega-3 fatty acids 1000 MG capsule Take 1 g by mouth 4 (four) times daily.     Yes Historical Provider, MD  haloperidol decanoate (HALDOL DECANOATE) 100 MG/ML injection Inject 100 mg into the muscle every 14 (fourteen) days.     Yes Historical Provider, MD  insulin glargine (LANTUS) 100 UNIT/ML injection Inject 20 Units into the skin 2 (two) times daily.     Yes Historical Provider, MD  lisinopril (PRINIVIL,ZESTRIL) 40 MG tablet Take 40 mg by mouth daily.     Yes Historical  Provider, MD  lithium carbonate 300 MG capsule Take 900 mg by mouth at bedtime.    Yes Historical Provider, MD  metoprolol (LOPRESSOR) 25 MG tablet Take 1 tablet (25 mg total) by mouth 2 (two) times daily. 05/18/11  Yes Yehuda Savannah, MD  multivitamin [facility]) per tablet Take 1 tablet by mouth daily.     Yes Historical Provider, MD  naproxen sodium (ANAPROX) 220 MG tablet Take 1 tablet (220 mg total) by mouth as needed. 05/18/11  Yes Yehuda Savannah, MD  potassium chloride SA (K-DUR,KLOR-CON) 20 MEQ tablet Take 20 mEq by mouth daily.     Yes Historical Provider, MD  pravastatin (PRAVACHOL) 40 MG tablet Take 40 mg by mouth daily.     Yes Historical Provider, MD  temazepam (RESTORIL) 15 MG capsule Take 15 mg by mouth at bedtime.     Yes Historical Provider, MD  buPROPion (ZYBAN) 150 MG 12 hr tablet Take 1 tablet (150 mg total) by mouth 2 (two) times daily. 09/19/11 10/19/11  Yehuda Savannah, MD    No Known Allergies    Past medical history, social history, and family history reviewed and updated.  ROS: Denies orthopnea, PND, exertional dyspnea, chest discomfort, palpitations, lightheadedness or syncope.  All of the systems reviewed and are negative.  PHYSICAL EXAM: BP 137/77  Pulse 63  Resp 18  Ht $R'5\' 9"'DO$  (1.753 m)  Wt 101.606 kg (224 lb)  BMI 33.08 kg/m2  General-Well developed; no acute distress Body habitus-Mildly to moderately overweight Neck-No JVD; no carotid bruits Lungs-In future and expiratory high-pitched rhonchi; prolonged expiratory phase; resonant to percussion Cardiovascular-normal PMI; normal S1 and S2; somewhat irregular rhythm, perhaps related to PACs or PVCs; modest systolic ejection murmur Abdomen-normal bowel sounds; soft and non-tender without masses or organomegaly Musculoskeletal-No deformities, no cyanosis or clubbing Neurologic-Normal cranial nerves; symmetric strength and tone Skin-Warm, no significant lesions Extremities-distal pulses intact; no  edema  ASSESSMENT AND PLAN:  Jacqulyn Ducking, MD 09/19/2011 4:48 PM

## 2011-10-03 ENCOUNTER — Other Ambulatory Visit: Payer: Self-pay | Admitting: Cardiology

## 2012-03-07 ENCOUNTER — Other Ambulatory Visit: Payer: Self-pay | Admitting: Cardiology

## 2012-03-19 ENCOUNTER — Ambulatory Visit (INDEPENDENT_AMBULATORY_CARE_PROVIDER_SITE_OTHER): Payer: Medicaid Other | Admitting: Cardiology

## 2012-03-19 ENCOUNTER — Encounter: Payer: Self-pay | Admitting: Cardiology

## 2012-03-19 VITALS — BP 170/90 | HR 61 | Ht 69.5 in | Wt 218.0 lb

## 2012-03-19 DIAGNOSIS — I351 Nonrheumatic aortic (valve) insufficiency: Secondary | ICD-10-CM

## 2012-03-19 DIAGNOSIS — E785 Hyperlipidemia, unspecified: Secondary | ICD-10-CM

## 2012-03-19 DIAGNOSIS — F17200 Nicotine dependence, unspecified, uncomplicated: Secondary | ICD-10-CM

## 2012-03-19 DIAGNOSIS — I2510 Atherosclerotic heart disease of native coronary artery without angina pectoris: Secondary | ICD-10-CM

## 2012-03-19 DIAGNOSIS — I359 Nonrheumatic aortic valve disorder, unspecified: Secondary | ICD-10-CM

## 2012-03-19 DIAGNOSIS — I10 Essential (primary) hypertension: Secondary | ICD-10-CM

## 2012-03-19 DIAGNOSIS — N189 Chronic kidney disease, unspecified: Secondary | ICD-10-CM

## 2012-03-19 DIAGNOSIS — I7090 Unspecified atherosclerosis: Secondary | ICD-10-CM

## 2012-03-19 DIAGNOSIS — Z720 Tobacco use: Secondary | ICD-10-CM

## 2012-03-19 DIAGNOSIS — E119 Type 2 diabetes mellitus without complications: Secondary | ICD-10-CM

## 2012-03-19 DIAGNOSIS — N182 Chronic kidney disease, stage 2 (mild): Secondary | ICD-10-CM | POA: Insufficient documentation

## 2012-03-19 MED ORDER — CLONIDINE HCL 0.3 MG/24HR TD PTWK: 1.0000 | MEDICATED_PATCH | TRANSDERMAL | Status: DC

## 2012-03-19 NOTE — Assessment & Plan Note (Signed)
No clinical evidence for progression or symptoms related to his valvular disease.  We will continue to monitor and to perform periodic echocardiograms to assess left ventricular function and dimensions.

## 2012-03-19 NOTE — Assessment & Plan Note (Signed)
Excellent control of hyperlipidemia with current therapy, which will be continued.

## 2012-03-19 NOTE — Assessment & Plan Note (Signed)
Recent hemoglobin A1c level of 5.8 indicating mild diabetes in excellent control.

## 2012-03-19 NOTE — Assessment & Plan Note (Signed)
Blood pressure control has been lost.  We may need to more fully document compliance with medical therapy, but for now will increase clonidine to level III.  Use of diuretic is not desirable in the face of mild to moderate chronic kidney disease and treatment with lithium.

## 2012-03-19 NOTE — Assessment & Plan Note (Signed)
Patient claims very modest tobacco consumption.  He is encouraged to quit entirely.

## 2012-03-19 NOTE — Progress Notes (Deleted)
Name: [name]    DOB: [birth year]  Age: 46 y.o.  MR#: [id number]       PCP:  Alonza Bogus, MD      Insurance: $[email or web address]$ @   [facility] [place] PCP 11/10/11  CC:    Chief Complaint  Patient presents with  . Coronary Artery Disease  . Hypertension    VS BP 170/90  Pulse 61  Ht 5' 9.5" (1.765 m)  Wt 218 lb (98.884 kg)  BMI 31.73 kg/m2  SpO2 98%  Weights Current Weight  03/19/12 218 lb (98.884 kg)  09/19/11 224 lb (101.606 kg)  08/19/11 226 lb (102.513 kg)    Blood Pressure  BP Readings from Last 3 Encounters:  03/19/12 170/90  09/19/11 137/77  08/19/11 148/82     Admit date:  (Not on file) Last encounter with RMR:  03/07/2012   Allergy No Known Allergies  Current Outpatient Prescriptions  Medication Sig Dispense Refill  . AIMSCO INSULIN SYR ULTRA THIN 29G X 1/2" 0.5 ML MISC       . amLODipine (NORVASC) 10 MG tablet Take 10 mg by mouth daily.        Marland Kitchen buPROPion (ZYBAN) 150 MG 12 hr tablet Take 1 tablet (150 mg total) by mouth 2 (two) times daily.  60 tablet  12  . cloNIDine (CATAPRES - DOSED IN MG/24 HR) 0.1 mg/24hr patch Place 1 patch onto the skin once a week.       . diclofenac sodium (VOLTAREN) 1 % GEL Apply 1 application topically 4 (four) times daily.        . fish oil-omega-3 fatty acids 1000 MG capsule Take 1 g by mouth 4 (four) times daily.        . haloperidol decanoate (HALDOL DECANOATE) 100 MG/ML injection Inject 100 mg into the muscle every 14 (fourteen) days.        . insulin glargine (LANTUS) 100 UNIT/ML injection Inject 20 Units into the skin 2 (two) times daily.        Marland Kitchen lisinopril (PRINIVIL,ZESTRIL) 40 MG tablet Take 40 mg by mouth daily.        Marland Kitchen lithium carbonate 300 MG capsule Take 900 mg by mouth at bedtime.       . metoprolol tartrate (LOPRESSOR) 25 MG tablet TAKE 1 TABLET BY MOUTH TWICE DAILY.  60 tablet  12  . multivitamin (THERAGRAN) per tablet Take 1 tablet by mouth daily.        . potassium chloride SA (K-DUR,KLOR-CON) 20 MEQ  tablet Take 20 mEq by mouth daily.        . pravastatin (PRAVACHOL) 40 MG tablet Take 40 mg by mouth daily.        . QC LO-DOSE ASPIRIN 81 MG EC tablet TAKE ONE TABLET BY MOUTH ONCE DAILY.  90 tablet  2  . temazepam (RESTORIL) 15 MG capsule Take 15 mg by mouth at bedtime.          Discontinued Meds:    Medications Discontinued During This Encounter  Medication Reason  . fexofenadine-pseudoephedrine (ALLEGRA-D 12 HOUR) 60-120 MG per tablet Error  . naproxen sodium (ANAPROX) 220 MG tablet Error    Patient Active Problem List  Diagnosis  . Hypertension  . Hyperlipidemia  . Aortic insufficiency  . Arteriosclerotic cardiovascular disease (ASCVD)  . Schizophrenia  . Obesity  . Tobacco abuse  . Diabetes mellitus, type 2  . Rhinitis    LABS No visits with results within 3 Month(s) from this visit. Latest known  visit with results is:  Admission on 03/02/2011, Discharged on 03/02/2011  Component Date Value  . WBC 03/02/2011 5.9   . RBC 03/02/2011 4.77   . Hemoglobin 03/02/2011 13.2   . HCT 03/02/2011 40.3   . MCV 03/02/2011 84.5   . [facility] 03/02/2011 27.7   . MCHC 03/02/2011 32.8   . RDW 03/02/2011 15.5   . Platelets 03/02/2011 177   . Sodium 03/02/2011 136   . Potassium 03/02/2011 3.9   . Chloride 03/02/2011 103   . CO2 03/02/2011 25   . Glucose, Bld 03/02/2011 88   . BUN 03/02/2011 15   . Creatinine, Ser 03/02/2011 1.28   . Calcium 03/02/2011 10.4   . GFR calc non Af Amer 03/02/2011 65*  . GFR calc Af Amer 03/02/2011 76*  . Lithium Lvl 03/02/2011 0.83      Results for this Opt Visit:     Results for orders placed during the hospital encounter of 03/02/11  CBC      Component Value Range   WBC 5.9  4.0 - 10.5 K/uL   RBC 4.77  4.22 - 5.81 MIL/uL   Hemoglobin 13.2  13.0 - 17.0 g/dL   HCT 40.3  39.0 - 52.0 %   MCV 84.5  78.0 - 100.0 fL   MCH 27.7  26.0 - 34.0 pg   MCHC 32.8  30.0 - 36.0 g/dL   RDW 15.5  11.5 - 15.5 %   Platelets 177  150 - 400 K/uL  BASIC  METABOLIC PANEL      Component Value Range   Sodium 136  135 - 145 mEq/L   Potassium 3.9  3.5 - 5.1 mEq/L   Chloride 103  96 - 112 mEq/L   CO2 25  19 - 32 mEq/L   Glucose, Bld 88  70 - 99 mg/dL   BUN 15  6 - 23 mg/dL   Creatinine, Ser 1.28  0.50 - 1.35 mg/dL   Calcium 10.4  8.4 - 10.5 mg/dL   GFR calc non Af Amer 65 (*) >90 mL/min   GFR calc Af Amer 76 (*) >90 mL/min  LITHIUM LEVEL      Component Value Range   Lithium Lvl 0.83  0.80 - 1.40 mEq/L    EKG Orders placed during the hospital encounter of 03/02/11  . ED EKG  . ED EKG  . EKG     Prior Assessment and Plan Problem List as of 03/19/2012            Cardiology Problems   Hypertension   Last Assessment & Plan Note   09/19/2011 Office Visit Signed 09/19/2011  4:54 PM by Yehuda Savannah, MD    Patient is doing well and is currently asymptomatic from a cardiopulmonary standpoint despite significant bronchospasm apparent on exam.  Control of blood pressure is improved.  Current medication will be continued.    Hyperlipidemia   Last Assessment & Plan Note   05/18/2011 Office Visit Signed 05/18/2011  4:30 PM by Yehuda Savannah, MD    Total and LDL cholesterol were extremely low when last assessed earlier this month.  Current dose of pravastatin is acceptable.    Aortic insufficiency   Last Assessment & Plan Note   05/18/2011 Office Visit Addendum 05/18/2011  4:29 PM by Yehuda Savannah, MD    No murmur appreciated on examination.  LV size and function were normal when last assessed approximately 18 months ago.  A repeat echocardiogram will be appropriate within  the next year or 2 to reassess the significance of this valvular abnormality.    Arteriosclerotic cardiovascular disease (ASCVD)   Last Assessment & Plan Note   05/18/2011 Office Visit Signed 05/18/2011  4:28 PM by Yehuda Savannah, MD    No symptoms to suggest myocardial ischemia since MI in 2010.  We will continue to optimize control of cardiovascular risk  factors.      Other   Schizophrenia   Obesity   Tobacco abuse   Last Assessment & Plan Note   09/19/2011 Office Visit Signed 09/19/2011  4:56 PM by Yehuda Savannah, MD    Patient is interested in a quit attempt.  Prescription for Zyban given to assist him in this endeavor.    Diabetes mellitus, type 2   Rhinitis       Imaging: No results found.   FRS Calculation: Score not calculated. Missing: Total Cholesterol

## 2012-03-19 NOTE — Progress Notes (Signed)
Patient ID: [name], male   DOB: February 17, 1966, 46 y.o.   MRN: [id number]  HPI: Scheduled return visit for this very pleasant gentleman followed for hypertension and coronary artery disease.  Since his last visit, he has done quite well clinically.  He remains active including performing a fair amount of work around his house without cardiopulmonary symptoms.  He has not monitored blood pressure.  He reports good compliance with his medical regime.  Prior to Admission medications   Medication Sig Start Date End Date Taking? Authorizing Provider  AIMSCO INSULIN SYR ULTRA THIN 29G X 1/2" 0.5 ML MISC  06/29/11  Yes Historical Provider, MD  amLODipine (NORVASC) 10 MG tablet Take 10 mg by mouth daily.     Yes Historical Provider, MD  buPROPion (ZYBAN) 150 MG 12 hr tablet Take 1 tablet (150 mg total) by mouth 2 (two) times daily. 09/19/11 04/18/12 Yes Yehuda Savannah, MD  diclofenac sodium (VOLTAREN) 1 % GEL Apply 1 application topically 4 (four) times daily.     Yes Historical Provider, MD  fish oil-omega-3 fatty acids 1000 MG capsule Take 1 g by mouth 4 (four) times daily.     Yes Historical Provider, MD  haloperidol decanoate (HALDOL DECANOATE) 100 MG/ML injection Inject 100 mg into the muscle every 14 (fourteen) days.     Yes Historical Provider, MD  insulin glargine (LANTUS) 100 UNIT/ML injection Inject 20 Units into the skin 2 (two) times daily.     Yes Historical Provider, MD  lisinopril (PRINIVIL,ZESTRIL) 40 MG tablet Take 40 mg by mouth daily.     Yes Historical Provider, MD  lithium carbonate 300 MG capsule Take 900 mg by mouth at bedtime.    Yes Historical Provider, MD  metoprolol tartrate (LOPRESSOR) 25 MG tablet TAKE 1 TABLET BY MOUTH TWICE DAILY. 10/03/11  Yes Yehuda Savannah, MD  multivitamin [facility]) per tablet Take 1 tablet by mouth daily.     Yes Historical Provider, MD  potassium chloride SA (K-DUR,KLOR-CON) 20 MEQ tablet Take 20 mEq by mouth daily.     Yes Historical Provider,  MD  pravastatin (PRAVACHOL) 40 MG tablet Take 40 mg by mouth daily.     Yes Historical Provider, MD  QC LO-DOSE ASPIRIN 81 MG EC tablet TAKE ONE TABLET BY MOUTH ONCE DAILY. 03/07/12  Yes Yehuda Savannah, MD  temazepam (RESTORIL) 15 MG capsule Take 15 mg by mouth at bedtime.     Yes Historical Provider, MD  cloNIDine (CATAPRES - DOSED IN MG/24 HR) 0.3 mg/24hr Place 1 patch (0.3 mg total) onto the skin once a week. 03/19/12   Yehuda Savannah, MD    No Known Allergies    Past medical history, social history, and family history reviewed and updated.  ROS: Denies orthopnea, PND, pedal edema, palpitations, lightheadedness or syncope.  All other systems reviewed and are negative.  PHYSICAL EXAM: BP 170/90  Pulse 61  Ht 5' 9.5" (1.765 m)  Wt 98.884 kg (218 lb)  BMI 31.73 kg/m2  SpO2 98% ; Repeat blood pressure 170/80 and 180 palp General-Well developed; no acute distress Body habitus-Overweight Neck-No JVD; no carotid bruits Lungs-clear lung fields; resonant to percussion Cardiovascular-normal PMI; normal S1 and S2; grade 2/6 basilar systolic ejection murmur; minimal early diastolic murmur Abdomen-normal bowel sounds; soft and non-tender without masses or organomegaly Musculoskeletal-No deformities, no cyanosis or clubbing Neurologic-Normal cranial nerves; symmetric strength and tone; slightly slurred speech; concrete thought Skin-Warm, no significant lesions Extremities-distal pulses intact; Minimal, if any, edema  ASSESSMENT AND PLAN:  Jacqulyn Ducking, MD 03/19/2012 12:03 PM

## 2012-03-19 NOTE — Assessment & Plan Note (Signed)
No symptoms to suggest recurrent myocardial ischemia.  We are continuing to control risk factors to the extent possible.

## 2012-03-19 NOTE — Patient Instructions (Addendum)
Your physician recommends that you schedule a follow-up appointment in:   1 - 6 months  2 - 1 month for a blood pressure check  Your physician has recommended you make the following change in your medication:  1 - INCREASE Clonidine Patch to 0.$RemoveBe'3mg'[id number]$ /24 hour weekly  ADDENDUM:  CMET,CBC,TSH AND ECHO

## 2012-04-02 ENCOUNTER — Encounter: Payer: Self-pay | Admitting: *Deleted

## 2012-04-12 LAB — COMPREHENSIVE METABOLIC PANEL
ALT: 14 U/L (ref 0–53)
AST: 17 U/L (ref 0–37)
Albumin: 4.3 g/dL (ref 3.5–5.2)
Alkaline Phosphatase: 109 U/L (ref 39–117)
BUN: 14 mg/dL (ref 6–23)
CO2: 25 mEq/L (ref 19–32)
Calcium: 9.5 mg/dL (ref 8.4–10.5)
Chloride: 105 mEq/L (ref 96–112)
Creat: 1.33 mg/dL (ref 0.50–1.35)
Glucose, Bld: 90 mg/dL (ref 70–99)
Potassium: 4.5 mEq/L (ref 3.5–5.3)
Sodium: 138 mEq/L (ref 135–145)
Total Bilirubin: 0.7 mg/dL (ref 0.3–1.2)
Total Protein: 6.4 g/dL (ref 6.0–8.3)

## 2012-04-12 LAB — CBC
HCT: 39.8 % (ref 39.0–52.0)
Hemoglobin: 13.2 g/dL (ref 13.0–17.0)
MCH: 27.8 pg (ref 26.0–34.0)
MCHC: 33.2 g/dL (ref 30.0–36.0)
MCV: 84 fL (ref 78.0–100.0)
Platelets: 211 10*3/uL (ref 150–400)
RBC: 4.74 MIL/uL (ref 4.22–5.81)
RDW: 15.2 % (ref 11.5–15.5)
WBC: 6.1 10*3/uL (ref 4.0–10.5)

## 2012-04-12 LAB — TSH: TSH: 2.268 u[IU]/mL (ref 0.350–4.500)

## 2012-04-15 ENCOUNTER — Encounter: Payer: Self-pay | Admitting: Cardiology

## 2012-04-16 ENCOUNTER — Ambulatory Visit (INDEPENDENT_AMBULATORY_CARE_PROVIDER_SITE_OTHER): Payer: Medicaid Other | Admitting: *Deleted

## 2012-04-16 ENCOUNTER — Encounter: Payer: Self-pay | Admitting: *Deleted

## 2012-04-16 VITALS — BP 180/80 | HR 59 | Ht 69.0 in | Wt 221.0 lb

## 2012-04-16 DIAGNOSIS — I10 Essential (primary) hypertension: Secondary | ICD-10-CM

## 2012-04-16 NOTE — Progress Notes (Signed)
Presents for blood pressure check. States taking medications as prescribed.  No blood pressure list presented.  Denies complaints. 03/19/12 OV BP 170/90  Pulse 6, increased Clonidine Patch to 0.$RemoveBe'3mg'[id number]$ /24 hour weekly.

## 2012-04-24 NOTE — Progress Notes (Signed)
Patient ID: [name], male   DOB: 22-Jun-1965, 47 y.o.   MRN: [id number]  Amlodipine 5 mg per day Home blood pressure If no home blood pressures within 2 weeks, obtain 24 hour blood pressure recording. Blood pressure check in one month.  04/25/2012: Verify with pharmacy that prescriptions have been filled as ordered.

## 2012-04-25 NOTE — Progress Notes (Signed)
Patient is already taking Amlodipine 10 mg a day.  Please advise.

## 2012-04-26 ENCOUNTER — Telehealth: Payer: Self-pay | Admitting: *Deleted

## 2012-04-26 NOTE — Telephone Encounter (Signed)
Jeff Age, RN 04/16/2012 10:24 AM Pended  Presents for blood pressure check. States taking medications as prescribed. No blood pressure list presented. Denies complaints. 03/19/12 OV BP 170/90  Pulse 6, increased Clonidine Patch to 0.$RemoveBe'3mg'[id number]$ /24 hour weekly.   Jacqulyn Ducking, MD 04/25/2012 10:25 AM Pended  Patient ID: [name], male DOB: 1965-12-17, 47 y.o. MRN: [id number]  Amlodipine 5 mg per day  Home blood pressure  If no home blood pressures within 2 weeks, obtain 24 hour blood pressure recording.  Blood pressure check in one month.  04/25/2012: Verify with pharmacy that prescriptions have been filled as ordered. Jeff Age, RN 04/25/2012 9:52 AM Pended  Patient is already taking Amlodipine 10 mg a day. Please advise.

## 2012-05-15 ENCOUNTER — Ambulatory Visit (HOSPITAL_COMMUNITY)
Admission: RE | Admit: 2012-05-15 | Discharge: 2012-05-15 | Disposition: A | Discharge status: Home / Self Care | Payer: Medicaid Other | Source: Ambulatory Visit | Attending: Cardiology | Admitting: Cardiology

## 2012-05-15 ENCOUNTER — Telehealth: Payer: Self-pay | Admitting: Cardiology

## 2012-05-15 DIAGNOSIS — I359 Nonrheumatic aortic valve disorder, unspecified: Secondary | ICD-10-CM

## 2012-05-15 DIAGNOSIS — E785 Hyperlipidemia, unspecified: Secondary | ICD-10-CM

## 2012-05-15 DIAGNOSIS — I351 Nonrheumatic aortic (valve) insufficiency: Secondary | ICD-10-CM

## 2012-05-15 DIAGNOSIS — I2510 Atherosclerotic heart disease of native coronary artery without angina pectoris: Secondary | ICD-10-CM

## 2012-05-15 DIAGNOSIS — Z720 Tobacco use: Secondary | ICD-10-CM

## 2012-05-15 DIAGNOSIS — N189 Chronic kidney disease, unspecified: Secondary | ICD-10-CM

## 2012-05-15 DIAGNOSIS — I10 Essential (primary) hypertension: Secondary | ICD-10-CM

## 2012-05-15 DIAGNOSIS — E119 Type 2 diabetes mellitus without complications: Secondary | ICD-10-CM

## 2012-05-15 NOTE — Progress Notes (Signed)
*  PRELIMINARY RESULTS* Echocardiogram Echocardiogram without contrast has been performed.  Tera Partridge 05/15/2012, 8:57 AM

## 2012-05-15 NOTE — Telephone Encounter (Signed)
Pt would like to check BP at home instead of coming in on 05/25/12

## 2012-05-15 NOTE — Telephone Encounter (Signed)
Patient will give [place] a call with reading and appointment will be cancelled per his request.

## 2012-05-21 ENCOUNTER — Encounter: Payer: Self-pay | Admitting: *Deleted

## 2012-06-13 ENCOUNTER — Telehealth: Payer: Self-pay | Admitting: Cardiology

## 2012-06-13 MED ORDER — CLONIDINE HCL 0.3 MG/24HR TD PTWK: 1.0000 | MEDICATED_PATCH | TRANSDERMAL | Status: DC

## 2012-06-13 NOTE — Telephone Encounter (Signed)
Was advised by front staff pt wants refill for clonidine patch, sent via escribe

## 2012-06-18 ENCOUNTER — Other Ambulatory Visit: Payer: Self-pay | Admitting: Cardiology

## 2012-09-01 ENCOUNTER — Other Ambulatory Visit: Payer: Self-pay | Admitting: Cardiology

## 2012-09-06 ENCOUNTER — Ambulatory Visit (INDEPENDENT_AMBULATORY_CARE_PROVIDER_SITE_OTHER): Payer: Medicaid Other | Admitting: Cardiology

## 2012-09-06 ENCOUNTER — Encounter: Payer: Self-pay | Admitting: Cardiology

## 2012-09-06 VITALS — BP 160/80 | HR 75 | Ht 69.5 in | Wt 203.0 lb

## 2012-09-06 DIAGNOSIS — I359 Nonrheumatic aortic valve disorder, unspecified: Secondary | ICD-10-CM

## 2012-09-06 DIAGNOSIS — E785 Hyperlipidemia, unspecified: Secondary | ICD-10-CM

## 2012-09-06 DIAGNOSIS — I351 Nonrheumatic aortic (valve) insufficiency: Secondary | ICD-10-CM

## 2012-09-06 DIAGNOSIS — I7090 Unspecified atherosclerosis: Secondary | ICD-10-CM

## 2012-09-06 DIAGNOSIS — I10 Essential (primary) hypertension: Secondary | ICD-10-CM

## 2012-09-06 DIAGNOSIS — I2510 Atherosclerotic heart disease of native coronary artery without angina pectoris: Secondary | ICD-10-CM

## 2012-09-06 DIAGNOSIS — N182 Chronic kidney disease, stage 2 (mild): Secondary | ICD-10-CM

## 2012-09-06 NOTE — Assessment & Plan Note (Signed)
Renal function was stable to improved when last assessed in 04/2012. Continued monitoring at fairly long intervals is appropriate.

## 2012-09-06 NOTE — Progress Notes (Signed)
Patient ID: [name], male   DOB: [birth year], 47 y.o.   MRN: [id number]  HPI: Schedule return visit for continued assessment and treatment of hypertension, hyperlipidemia, coronary artery disease and aortic insufficiency. Patient resides in a group home and reports that he does well in general. Blood pressure is apparently not routinely assessed in his home situation.  Current Outpatient Prescriptions  Medication Sig Dispense Refill  . AIMSCO INSULIN SYR ULTRA THIN 29G X 1/2" 0.5 ML MISC       . amLODipine (NORVASC) 10 MG tablet Take 10 mg by mouth daily.        Marland Kitchen buPROPion (ZYBAN) 150 MG 12 hr tablet Take 1 tablet (150 mg total) by mouth 2 (two) times daily.  60 tablet  12  . cloNIDine (CATAPRES - DOSED IN MG/24 HR) 0.3 mg/24hr Place 1 patch (0.3 mg total) onto the skin once a week.  4 patch  6  . diclofenac sodium (VOLTAREN) 1 % GEL Apply 1 application topically 4 (four) times daily.        . fish oil-omega-3 fatty acids 1000 MG capsule Take 1 g by mouth 4 (four) times daily.        . haloperidol decanoate (HALDOL DECANOATE) 100 MG/ML injection Inject 100 mg into the muscle every 14 (fourteen) days.        . insulin glargine (LANTUS) 100 UNIT/ML injection Inject 20 Units into the skin 2 (two) times daily.        Marland Kitchen lisinopril (PRINIVIL,ZESTRIL) 40 MG tablet Take 40 mg by mouth daily.        Marland Kitchen lithium carbonate 300 MG capsule Take 900 mg by mouth at bedtime.       . metoprolol tartrate (LOPRESSOR) 25 MG tablet TAKE 1 TABLET BY MOUTH TWICE DAILY.  60 tablet  12  . multivitamin (THERAGRAN) per tablet Take 1 tablet by mouth daily.        . potassium chloride SA (K-DUR,KLOR-CON) 20 MEQ tablet Take 20 mEq by mouth daily.        . pravastatin (PRAVACHOL) 40 MG tablet Take 40 mg by mouth daily.        . QC LO-DOSE ASPIRIN 81 MG EC tablet TAKE ONE TABLET BY MOUTH ONCE DAILY.  90 tablet  2  . temazepam (RESTORIL) 15 MG capsule Take 15 mg by mouth at bedtime.         No current  facility-administered medications for this visit.   No Known Allergies   Past medical history, social history, and family history reviewed and updated.  ROS: Denies chest pain, dyspnea, peripheral edema, lightheadedness or syncope. All other systems reviewed and are negative.  PHYSICAL EXAM: BP 160/80  Pulse 75  Ht 5' 9.5" (1.765 m)  Wt 92.08 kg (203 lb)  BMI 29.56 kg/m2;  Body mass index is 29.56 kg/(m^2). Repeat BP: 145/70 General-Well developed; no acute distress Body habitus-mildly overweight Neck-No JVD; no carotid bruits; normal pulses Lungs-clear lung fields; resonant to percussion Cardiovascular-normal PMI; normal S1 and S2; grade 2/6 basilar systolic ejection murmur and grade 1/6 diastolic murmur; no precordial lift Abdomen-normal bowel sounds; soft and non-tender without masses or organomegaly Musculoskeletal-No deformities, no cyanosis or clubbing Neurologic-Normal cranial nerves; symmetric strength and tone Skin-Warm, no significant lesions Extremities-distal pulses intact; no edema  EKG: Sinus bradycardia at a rate of 49 bpm; delayed R-wave progression; first-degree AV block; voltage criteria for LVH; ST-T wave abnormality consistent with lateral ischemia or LVH. No previous tracing for comparison.  Jacqulyn Ducking, MD 09/06/2012  1:58 PM  ASSESSMENT AND PLAN

## 2012-09-06 NOTE — Assessment & Plan Note (Signed)
Echocardiogram suggested aortic valve disease is clinically insignificant. Serial echocardiograms do not appear to be warranted.

## 2012-09-06 NOTE — Assessment & Plan Note (Signed)
Blood pressure control remains suboptimal. To exclude whitecoat hypertension, a 24-hour monitor will be provided and medication adjusted accordingly.

## 2012-09-06 NOTE — Assessment & Plan Note (Addendum)
No segmental wall motion abnormality appreciated on echocardiography. With normal LV systolic function and single-vessel disease, prognosis should be excellent. We will continue to control cardiovascular risk factors to the extent possible.

## 2012-09-06 NOTE — Progress Notes (Deleted)
Name: [name]    DOB: 1965/04/24  Age: 47 y.o.  MR#: [id number]       PCP:  Alonza Bogus, MD      Insurance: Payor: MEDICAID [place] / Plan: MEDICAID [place] ACCESS / Product Type: *No Product type* /   CC:   No chief complaint on file.  NO LIST, WILL CALL PHARMACY PER PT NOT REALLY CORRECT ON  MEDICATION  VS Filed Vitals:   09/06/12 1328  BP: 160/80  Pulse: 75  Height: 5' 9.5" (1.765 m)  Weight: 203 lb (92.08 kg)    Weights Current Weight  09/06/12 203 lb (92.08 kg)  04/16/12 221 lb (100.245 kg)  03/19/12 218 lb (98.884 kg)    Blood Pressure  BP Readings from Last 3 Encounters:  09/06/12 160/80  04/16/12 180/80  03/19/12 170/90     Admit date:  (Not on file) Last encounter with RMR:  09/01/2012   Allergy Review of patient's allergies indicates no known allergies.  Current Outpatient Prescriptions  Medication Sig Dispense Refill  . AIMSCO INSULIN SYR ULTRA THIN 29G X 1/2" 0.5 ML MISC       . amLODipine (NORVASC) 10 MG tablet Take 10 mg by mouth daily.        . cloNIDine (CATAPRES - DOSED IN MG/24 HR) 0.3 mg/24hr Place 1 patch (0.3 mg total) onto the skin once a week.  4 patch  6  . diclofenac sodium (VOLTAREN) 1 % GEL Apply 1 application topically 4 (four) times daily.        . fish oil-omega-3 fatty acids 1000 MG capsule Take 1 g by mouth 4 (four) times daily.        . haloperidol decanoate (HALDOL DECANOATE) 100 MG/ML injection Inject 100 mg into the muscle every 14 (fourteen) days.        . insulin glargine (LANTUS) 100 UNIT/ML injection Inject 20 Units into the skin 2 (two) times daily.        Marland Kitchen lisinopril (PRINIVIL,ZESTRIL) 40 MG tablet Take 40 mg by mouth daily.        Marland Kitchen lithium carbonate 300 MG capsule Take 900 mg by mouth at bedtime.       . metoprolol tartrate (LOPRESSOR) 25 MG tablet TAKE 1 TABLET BY MOUTH TWICE DAILY.  60 tablet  12  . multivitamin (THERAGRAN) per tablet Take 1 tablet by mouth daily.        . potassium chloride SA (K-DUR,KLOR-CON) 20  MEQ tablet Take 20 mEq by mouth daily.        . pravastatin (PRAVACHOL) 40 MG tablet Take 40 mg by mouth daily.        . QC LO-DOSE ASPIRIN 81 MG EC tablet TAKE ONE TABLET BY MOUTH ONCE DAILY.  90 tablet  2  . temazepam (RESTORIL) 15 MG capsule Take 15 mg by mouth at bedtime.        Marland Kitchen buPROPion (ZYBAN) 150 MG 12 hr tablet Take 1 tablet (150 mg total) by mouth 2 (two) times daily.  60 tablet  12   No current facility-administered medications for this visit.    Discontinued Meds:   There are no discontinued medications.  Patient Active Problem List   Diagnosis Date Noted  . Chronic kidney disease 03/19/2012  . Rhinitis 05/18/2011  . Arteriosclerotic cardiovascular disease (ASCVD)   . Schizophrenia   . Obesity   . Tobacco abuse   . Diabetes mellitus, type 2   . Hypertension   . Hyperlipidemia   .  Aortic insufficiency     LABS    Component Value Date/Time   NA 138 04/11/2012 1345   NA 136 03/02/2011 1435   NA 137 01/29/2011 2005   K 4.5 04/11/2012 1345   K 3.9 03/02/2011 1435   K 3.2* 01/29/2011 2005   CL 105 04/11/2012 1345   CL 103 03/02/2011 1435   CL 102 01/29/2011 2005   CO2 25 04/11/2012 1345   CO2 25 03/02/2011 1435   CO2 26 01/29/2011 2005   GLUCOSE 90 04/11/2012 1345   GLUCOSE 88 03/02/2011 1435   GLUCOSE 131* 01/29/2011 2005   BUN 14 04/11/2012 1345   BUN 15 03/02/2011 1435   BUN 17 01/29/2011 2005   CREATININE 1.33 04/11/2012 1345   CREATININE 1.28 03/02/2011 1435   CREATININE 1.24 01/29/2011 2005   CREATININE 1.10 11/11/2009 0612   CALCIUM 9.5 04/11/2012 1345   CALCIUM 10.4 03/02/2011 1435   CALCIUM 9.8 01/29/2011 2005   GFRNONAA 65* 03/02/2011 1435   GFRNONAA 68* 01/29/2011 2005   GFRNONAA >60 11/11/2009 0612   GFRAA 76* 03/02/2011 1435   GFRAA 79* 01/29/2011 2005   GFRAA  Value: >60        The eGFR has been calculated using the MDRD equation. This calculation has not been validated in all clinical situations. eGFR's persistently <60 mL/min signify possible Chronic  Kidney Disease. 11/11/2009 0612   CMP     Component Value Date/Time   NA 138 04/11/2012 1345   K 4.5 04/11/2012 1345   CL 105 04/11/2012 1345   CO2 25 04/11/2012 1345   GLUCOSE 90 04/11/2012 1345   BUN 14 04/11/2012 1345   CREATININE 1.33 04/11/2012 1345   CREATININE 1.28 03/02/2011 1435   CALCIUM 9.5 04/11/2012 1345   PROT 6.4 04/11/2012 1345   ALBUMIN 4.3 04/11/2012 1345   AST 17 04/11/2012 1345   ALT 14 04/11/2012 1345   ALKPHOS 109 04/11/2012 1345   BILITOT 0.7 04/11/2012 1345   GFRNONAA 65* 03/02/2011 1435   GFRAA 76* 03/02/2011 1435       Component Value Date/Time   WBC 6.1 04/11/2012 1345   WBC 5.9 03/02/2011 1435   WBC 9.0 01/29/2011 2005   HGB 13.2 04/11/2012 1345   HGB 13.2 03/02/2011 1435   HGB 11.8* 01/29/2011 2005   HCT 39.8 04/11/2012 1345   HCT 40.3 03/02/2011 1435   HCT 36.3* 01/29/2011 2005   MCV 84.0 04/11/2012 1345   MCV 84.5 03/02/2011 1435   MCV 82.7 01/29/2011 2005    Lipid Panel     Component Value Date/Time   CHOL  Value: 162        ATP III CLASSIFICATION:  <200     mg/dL   Desirable  200-239  mg/dL   Borderline High  >=240    mg/dL   High        07/29/2008 0327   TRIG 256* 07/29/2008 0327   HDL 28* 07/29/2008 0327   CHOLHDL 5.8 07/29/2008 0327   VLDL 51* 07/29/2008 0327   LDLCALC  Value: 83        Total Cholesterol/HDL:CHD Risk Coronary Heart Disease Risk Table                     Men   Women  1/2 Average Risk   3.4   3.3  Average Risk       5.0   4.4  2 X Average Risk   9.6   7.1  3 X  Average Risk  23.4   11.0        Use the calculated Patient Ratio above and the CHD Risk Table to determine the patient's CHD Risk.        ATP III CLASSIFICATION (LDL):  <100     mg/dL   Optimal  100-129  mg/dL   Near or Above                    Optimal  130-159  mg/dL   Borderline  160-189  mg/dL   High  >190     mg/dL   Very High 07/29/2008 0327    ABG No results found for this basename: phart, pco2, pco2art, po2, po2art, hco3, tco2, acidbasedef, o2sat     Lab Results  Component Value Date   TSH  2.268 04/11/2012   BNP (last 3 results) No results found for this basename: PROBNP,  in the last 8760 hours Cardiac Panel (last 3 results) No results found for this basename: CKTOTAL, CKMB, TROPONINI, RELINDX,  in the last 72 hours  Iron/TIBC/Ferritin No results found for this basename: iron, tibc, ferritin     EKG Orders placed in visit on 09/06/12  . EKG 12-LEAD     Prior Assessment and Plan Problem List as of 09/06/2012   Hypertension   Last Assessment & Plan   03/19/2012 Office Visit Written 03/19/2012 12:08 PM by Yehuda Savannah, MD     Blood pressure control has been lost.  We may need to more fully document compliance with medical therapy, but for now will increase clonidine to level III.  Use of diuretic is not desirable in the face of mild to moderate chronic kidney disease and treatment with lithium.    Hyperlipidemia   Last Assessment & Plan   03/19/2012 Office Visit Written 03/19/2012 12:21 PM by Yehuda Savannah, MD     Excellent control of hyperlipidemia with current therapy, which will be continued.    Aortic insufficiency   Last Assessment & Plan   03/19/2012 Office Visit Written 03/19/2012 12:06 PM by Yehuda Savannah, MD     No clinical evidence for progression or symptoms related to his valvular disease.  We will continue to monitor and to perform periodic echocardiograms to assess left ventricular function and dimensions.    Arteriosclerotic cardiovascular disease (ASCVD)   Last Assessment & Plan   03/19/2012 Office Visit Written 03/19/2012 12:07 PM by Yehuda Savannah, MD     No symptoms to suggest recurrent myocardial ischemia.  We are continuing to control risk factors to the extent possible.    Schizophrenia   Obesity   Tobacco abuse   Last Assessment & Plan   03/19/2012 Office Visit Written 03/19/2012 12:09 PM by Yehuda Savannah, MD     Patient claims very modest tobacco consumption.  He is encouraged to quit entirely.    Diabetes mellitus, type  2   Last Assessment & Plan   03/19/2012 Office Visit Written 03/19/2012 12:22 PM by Yehuda Savannah, MD     Recent hemoglobin A1c level of 5.8 indicating mild diabetes in excellent control.    Rhinitis   Chronic kidney disease       Imaging: No results found.

## 2012-09-06 NOTE — Assessment & Plan Note (Signed)
Lipid profile was excellent when assessed less than one year ago. Current therapy with pravastatin is adequate.

## 2012-09-06 NOTE — Patient Instructions (Addendum)
Your physician recommends that you schedule a follow-up appointment in: [place] physician recommends that you return for lab work in: [place] physician recommends that you have follow up lab work, we will mail you a reminder letter to alert you when to go [employer], located across the street from our office.   Your physician has recommended that you wear a holter monitor. Holter monitors are medical devices that record the heart's electrical activity. Doctors most often use these monitors to diagnose arrhythmias. Arrhythmias are problems with the speed or rhythm of the heartbeat. The monitor is a small, portable device. You can wear one while you do your normal daily activities. This is usually used to diagnose what is causing palpitations/syncope (passing out).24 HOURS

## 2012-09-10 ENCOUNTER — Ambulatory Visit (HOSPITAL_COMMUNITY)
Admission: RE | Admit: 2012-09-10 | Discharge: 2012-09-10 | Disposition: A | Discharge status: Home / Self Care | Payer: Medicaid Other | Source: Ambulatory Visit | Attending: Cardiology | Admitting: Cardiology

## 2012-09-10 ENCOUNTER — Other Ambulatory Visit: Payer: Self-pay | Admitting: Cardiology

## 2012-09-10 DIAGNOSIS — R002 Palpitations: Secondary | ICD-10-CM | POA: Insufficient documentation

## 2012-09-10 DIAGNOSIS — I10 Essential (primary) hypertension: Secondary | ICD-10-CM

## 2012-09-10 MED ORDER — CLONIDINE HCL 0.3 MG/24HR TD PTWK: 1.0000 | MEDICATED_PATCH | TRANSDERMAL | Status: DC

## 2012-09-10 NOTE — Progress Notes (Signed)
*  PRELIMINARY RESULTS* Echocardiogram 24H Holter monitor has been performed.  [name] 09/10/2012, 2:01 PM

## 2012-09-10 NOTE — Telephone Encounter (Signed)
cloNIDine (CATAPRES - DOSED IN MG/24 HR) 0.3 mg/24hr 4 patch 6 06/13/2012      Sig - Route:  Place 1 patch (0.3 mg total) onto the skin once a week. - Transdermal    Class:  Normal    Authorizing Provider:  Yehuda Savannah, MD    Ordering User:  Alba Destine, LPN

## 2012-09-13 NOTE — Procedures (Signed)
[name], [name]            ACCOUNT NO.:  [id number]  MEDICAL RECORD NO.:  [id number]  LOCATION:  CARDIOPU                      FACILITY:  [facility]  PHYSICIAN:  Cristopher Estimable. Lattie Haw, MD, FACCDATE OF BIRTH:  Jul 12, 1965  DATE OF PROCEDURE:  09/10/2012 DATE OF DISCHARGE:  09/10/2012                               HOLTER MONITOR   CLINICAL DATA:  A 47 year old gentleman with palpitations. 1. Continuous electrocardiographic monitoring was maintained for 24     hours during which the predominant rhythm was normal sinus with     first-degree AV block.  There was substantial sinus bradycardia     with a lowest recorded rate of 34 BPM. 2. Rare premature ventricular depolarization occurred at an average     rate of fewer than 1 per hour 3. Rare premature supraventricular depolarizations occurred less      frequently than once per hour. 4. No significant arrhythmia was identified other than profound sinus     bradycardia. 5. No significant ST-segment elevation or depression was recorded. 6. An extensive diary of activity was returned; no symptoms were reported.  IMPRESSION:  Unremarkable continuous electrocardiographic recording.     Cristopher Estimable. Lattie Haw, MD, [facility]     RMR/MEDQ  D:  09/12/2012  T:  09/13/2012  Job:  [id number]

## 2012-09-17 ENCOUNTER — Ambulatory Visit: Payer: Medicaid Other | Admitting: Cardiology

## 2012-09-18 ENCOUNTER — Telehealth: Payer: Self-pay | Admitting: *Deleted

## 2012-09-18 MED ORDER — CATAPRES-TTS-3 0.3 MG/24HR TD PTWK: 1.0000 | MEDICATED_PATCH | TRANSDERMAL | Status: DC

## 2012-09-18 NOTE — Telephone Encounter (Signed)
Contacted [facility] and spoke to rep candy per noted PA for pt clonidine was faxed on 09-12-12 for Clinidine TTS patch 0.$RemoveBef'3mg'uBoQrDAtYe$ , noted this nurse has spoken with Hassan Rowan at [facility] to advise they also have not received the refill approval information, noted pt HQ#[phone] R, candy advised they have not received the fax sent in, transferred to pharmacist to give verbal information for pt PA to be processed during this call, spoke with rep Claiborne Billings advised that the pharmacy was trying to send in the generic for the clonidine patch 0.$RemoveBeforeDEI'3mg'DtEWJbejVdjplzDz$  and the pt insurance will only cover the name brand catapress, sent in refill for name brand, called brenda back to advise to run again for the pt and was received and noted the insurance will pay for the medication as I sent in for [place] per ins company, noted the insurance only charges 3.00 for the refill, she will advise pt the refill is ready for pick up

## 2012-10-08 ENCOUNTER — Encounter: Payer: Self-pay | Admitting: Cardiology

## 2012-10-10 ENCOUNTER — Other Ambulatory Visit: Payer: Self-pay | Admitting: Cardiology

## 2012-10-10 NOTE — Telephone Encounter (Signed)
Medication sent via escribe.  

## 2012-10-11 ENCOUNTER — Other Ambulatory Visit: Payer: Self-pay | Admitting: Cardiology

## 2012-10-11 NOTE — Telephone Encounter (Signed)
Medication sent via escribe.  

## 2012-11-03 ENCOUNTER — Other Ambulatory Visit: Payer: Self-pay | Admitting: Cardiology

## 2012-12-17 ENCOUNTER — Inpatient Hospital Stay (HOSPITAL_COMMUNITY)
Admission: EM | Admit: 2012-12-17 | Discharge: 2012-12-20 | DRG: 683 | Disposition: A | Discharge status: Home Health | Payer: Medicaid Other | Attending: Pulmonary Disease | Admitting: Pulmonary Disease

## 2012-12-17 ENCOUNTER — Encounter (HOSPITAL_COMMUNITY): Payer: Self-pay | Admitting: *Deleted

## 2012-12-17 DIAGNOSIS — E872 Acidosis, unspecified: Secondary | ICD-10-CM | POA: Diagnosis present

## 2012-12-17 DIAGNOSIS — N179 Acute kidney failure, unspecified: Secondary | ICD-10-CM | POA: Diagnosis present

## 2012-12-17 DIAGNOSIS — J449 Chronic obstructive pulmonary disease, unspecified: Secondary | ICD-10-CM | POA: Diagnosis present

## 2012-12-17 DIAGNOSIS — N170 Acute kidney failure with tubular necrosis: Principal | ICD-10-CM | POA: Diagnosis present

## 2012-12-17 DIAGNOSIS — F209 Schizophrenia, unspecified: Secondary | ICD-10-CM | POA: Diagnosis present

## 2012-12-17 DIAGNOSIS — I2510 Atherosclerotic heart disease of native coronary artery without angina pectoris: Secondary | ICD-10-CM | POA: Diagnosis present

## 2012-12-17 DIAGNOSIS — T3995XA Adverse effect of unspecified nonopioid analgesic, antipyretic and antirheumatic, initial encounter: Secondary | ICD-10-CM | POA: Diagnosis present

## 2012-12-17 DIAGNOSIS — E669 Obesity, unspecified: Secondary | ICD-10-CM | POA: Diagnosis present

## 2012-12-17 DIAGNOSIS — E871 Hypo-osmolality and hyponatremia: Secondary | ICD-10-CM | POA: Diagnosis present

## 2012-12-17 DIAGNOSIS — F319 Bipolar disorder, unspecified: Secondary | ICD-10-CM | POA: Diagnosis present

## 2012-12-17 DIAGNOSIS — K5289 Other specified noninfective gastroenteritis and colitis: Secondary | ICD-10-CM | POA: Diagnosis present

## 2012-12-17 DIAGNOSIS — F17200 Nicotine dependence, unspecified, uncomplicated: Secondary | ICD-10-CM | POA: Diagnosis present

## 2012-12-17 DIAGNOSIS — F79 Unspecified intellectual disabilities: Secondary | ICD-10-CM | POA: Diagnosis present

## 2012-12-17 DIAGNOSIS — E860 Dehydration: Secondary | ICD-10-CM | POA: Diagnosis present

## 2012-12-17 DIAGNOSIS — E785 Hyperlipidemia, unspecified: Secondary | ICD-10-CM | POA: Diagnosis present

## 2012-12-17 DIAGNOSIS — N182 Chronic kidney disease, stage 2 (mild): Secondary | ICD-10-CM | POA: Diagnosis present

## 2012-12-17 DIAGNOSIS — K219 Gastro-esophageal reflux disease without esophagitis: Secondary | ICD-10-CM | POA: Diagnosis present

## 2012-12-17 DIAGNOSIS — I252 Old myocardial infarction: Secondary | ICD-10-CM

## 2012-12-17 DIAGNOSIS — D649 Anemia, unspecified: Secondary | ICD-10-CM | POA: Diagnosis present

## 2012-12-17 DIAGNOSIS — Z794 Long term (current) use of insulin: Secondary | ICD-10-CM

## 2012-12-17 DIAGNOSIS — I959 Hypotension, unspecified: Secondary | ICD-10-CM | POA: Diagnosis present

## 2012-12-17 DIAGNOSIS — E876 Hypokalemia: Secondary | ICD-10-CM | POA: Diagnosis present

## 2012-12-17 DIAGNOSIS — I129 Hypertensive chronic kidney disease with stage 1 through stage 4 chronic kidney disease, or unspecified chronic kidney disease: Secondary | ICD-10-CM | POA: Diagnosis present

## 2012-12-17 DIAGNOSIS — J4489 Other specified chronic obstructive pulmonary disease: Secondary | ICD-10-CM | POA: Diagnosis present

## 2012-12-17 DIAGNOSIS — I359 Nonrheumatic aortic valve disorder, unspecified: Secondary | ICD-10-CM | POA: Diagnosis present

## 2012-12-17 DIAGNOSIS — I10 Essential (primary) hypertension: Secondary | ICD-10-CM | POA: Diagnosis present

## 2012-12-17 DIAGNOSIS — N19 Unspecified kidney failure: Secondary | ICD-10-CM

## 2012-12-17 DIAGNOSIS — K529 Noninfective gastroenteritis and colitis, unspecified: Secondary | ICD-10-CM | POA: Diagnosis present

## 2012-12-17 DIAGNOSIS — R197 Diarrhea, unspecified: Secondary | ICD-10-CM | POA: Diagnosis present

## 2012-12-17 DIAGNOSIS — E119 Type 2 diabetes mellitus without complications: Secondary | ICD-10-CM | POA: Diagnosis present

## 2012-12-17 DIAGNOSIS — E861 Hypovolemia: Secondary | ICD-10-CM | POA: Diagnosis present

## 2012-12-17 DIAGNOSIS — Z8673 Personal history of transient ischemic attack (TIA), and cerebral infarction without residual deficits: Secondary | ICD-10-CM

## 2012-12-17 LAB — HEPATIC FUNCTION PANEL
ALT: 6 U/L (ref 0–53)
AST: 8 U/L (ref 0–37)
Albumin: 3.2 g/dL — ABNORMAL LOW (ref 3.5–5.2)
Alkaline Phosphatase: 80 U/L (ref 39–117)
Bilirubin, Direct: <0.1 mg/dL (ref 0.0–0.3)
Total Bilirubin: 0.3 mg/dL (ref 0.3–1.2)
Total Protein: 6.8 g/dL (ref 6.0–8.3)

## 2012-12-17 LAB — CBC WITH DIFFERENTIAL/PLATELET
Basophils Absolute: 0 10*3/uL (ref 0.0–0.1)
Basophils Relative: 0 % (ref 0–1)
Eosinophils Absolute: 0.6 10*3/uL (ref 0.0–0.7)
Eosinophils Relative: 5 % (ref 0–5)
HCT: 38.4 % — ABNORMAL LOW (ref 39.0–52.0)
Hemoglobin: 12.9 g/dL — ABNORMAL LOW (ref 13.0–17.0)
Lymphocytes Relative: 20 % (ref 12–46)
Lymphs Abs: 2.5 10*3/uL (ref 0.7–4.0)
MCH: 27.5 pg (ref 26.0–34.0)
MCHC: 33.6 g/dL (ref 30.0–36.0)
MCV: 81.9 fL (ref 78.0–100.0)
Monocytes Absolute: 0.6 10*3/uL (ref 0.1–1.0)
Monocytes Relative: 5 % (ref 3–12)
Neutro Abs: 9 10*3/uL — ABNORMAL HIGH (ref 1.7–7.7)
Neutrophils Relative %: 70 % (ref 43–77)
Platelets: 264 10*3/uL (ref 150–400)
RBC: 4.69 MIL/uL (ref 4.22–5.81)
RDW: 15 % (ref 11.5–15.5)
WBC: 12.7 10*3/uL — ABNORMAL HIGH (ref 4.0–10.5)

## 2012-12-17 LAB — URINALYSIS, ROUTINE W REFLEX MICROSCOPIC
Glucose, UA: NEGATIVE mg/dL
Hgb urine dipstick: NEGATIVE
Leukocytes, UA: NEGATIVE
Nitrite: NEGATIVE
Protein, ur: NEGATIVE mg/dL
Specific Gravity, Urine: >1.03 — ABNORMAL HIGH (ref 1.005–1.030)
Urobilinogen, UA: 0.2 mg/dL (ref 0.0–1.0)
pH: 5.5 (ref 5.0–8.0)

## 2012-12-17 LAB — LITHIUM LEVEL: Lithium Lvl: 1.09 mEq/L (ref 0.80–1.40)

## 2012-12-17 LAB — BASIC METABOLIC PANEL
BUN: 71 mg/dL — ABNORMAL HIGH (ref 6–23)
CO2: 16 mEq/L — ABNORMAL LOW (ref 19–32)
Calcium: 9.8 mg/dL (ref 8.4–10.5)
Chloride: 103 mEq/L (ref 96–112)
Creatinine, Ser: 6.01 mg/dL — ABNORMAL HIGH (ref 0.50–1.35)
GFR calc Af Amer: 11 mL/min — ABNORMAL LOW (ref >90)
GFR calc non Af Amer: 10 mL/min — ABNORMAL LOW (ref >90)
Glucose, Bld: 87 mg/dL (ref 70–99)
Potassium: 3.9 mEq/L (ref 3.5–5.1)
Sodium: 133 mEq/L — ABNORMAL LOW (ref 135–145)

## 2012-12-17 LAB — GLUCOSE, CAPILLARY: Glucose-Capillary: 71 mg/dL (ref 70–99)

## 2012-12-17 LAB — TROPONIN I: Troponin I: <0.3 ng/mL (ref <0.30)

## 2012-12-17 LAB — CK: Total CK: 30 U/L (ref 7–232)

## 2012-12-17 MED ORDER — SODIUM BICARBONATE 8.4 % IV SOLN
INTRAVENOUS | Status: DC
  Administered 2012-12-17: 22:00:00 via INTRAVENOUS
  Filled 2012-12-17 (×5): qty 50

## 2012-12-17 MED ORDER — ACETAMINOPHEN 325 MG PO TABS: 650.0000 mg | ORAL_TABLET | Freq: Four times a day (QID) | ORAL | Status: DC | PRN

## 2012-12-17 MED ORDER — PANTOPRAZOLE SODIUM 40 MG PO TBEC
40.0000 mg | DELAYED_RELEASE_TABLET | Freq: Every day | ORAL | Status: DC
  Administered 2012-12-17 – 2012-12-20 (×4): 40 mg via ORAL
  Filled 2012-12-17 (×4): qty 1

## 2012-12-17 MED ORDER — SIMVASTATIN 20 MG PO TABS
20.0000 mg | ORAL_TABLET | Freq: Every day | ORAL | Status: DC
  Administered 2012-12-17 – 2012-12-19 (×3): 20 mg via ORAL
  Filled 2012-12-17 (×3): qty 1

## 2012-12-17 MED ORDER — ENOXAPARIN SODIUM 30 MG/0.3ML ~~LOC~~ SOLN
30.0000 mg | SUBCUTANEOUS | Status: DC
  Administered 2012-12-17 – 2012-12-19 (×3): 30 mg via SUBCUTANEOUS
  Filled 2012-12-17 (×3): qty 0.3

## 2012-12-17 MED ORDER — SODIUM CHLORIDE 0.9 % IV SOLN: INTRAVENOUS | Status: DC

## 2012-12-17 MED ORDER — SODIUM CHLORIDE 0.9 % IV SOLN
Freq: Once | INTRAVENOUS | Status: AC
  Administered 2012-12-17: 1000 mL via INTRAVENOUS

## 2012-12-17 MED ORDER — ASPIRIN EC 81 MG PO TBEC
81.0000 mg | DELAYED_RELEASE_TABLET | Freq: Every day | ORAL | Status: DC
  Administered 2012-12-18 – 2012-12-20 (×3): 81 mg via ORAL
  Filled 2012-12-17 (×3): qty 1

## 2012-12-17 MED ORDER — ONDANSETRON HCL 4 MG/2ML IJ SOLN: 4.0000 mg | Freq: Four times a day (QID) | INTRAMUSCULAR | Status: DC | PRN

## 2012-12-17 MED ORDER — METRONIDAZOLE IN NACL 5-0.79 MG/ML-% IV SOLN
500.0000 mg | Freq: Three times a day (TID) | INTRAVENOUS | Status: DC
  Administered 2012-12-17 – 2012-12-18 (×3): 500 mg via INTRAVENOUS
  Filled 2012-12-17 (×3): qty 100

## 2012-12-17 MED ORDER — ONDANSETRON HCL 4 MG PO TABS: 4.0000 mg | ORAL_TABLET | Freq: Four times a day (QID) | ORAL | Status: DC | PRN

## 2012-12-17 MED ORDER — ACETAMINOPHEN 650 MG RE SUPP: 650.0000 mg | Freq: Four times a day (QID) | RECTAL | Status: DC | PRN

## 2012-12-17 MED ORDER — BUPROPION HCL ER (XL) 150 MG PO TB24
150.0000 mg | ORAL_TABLET | Freq: Two times a day (BID) | ORAL | Status: DC
  Administered 2012-12-18: 150 mg via ORAL
  Filled 2012-12-17 (×4): qty 1

## 2012-12-17 MED ORDER — CIPROFLOXACIN IN D5W 400 MG/200ML IV SOLN: 400.0000 mg | INTRAVENOUS | Status: DC

## 2012-12-17 MED ORDER — SODIUM BICARBONATE 8.4 % IV SOLN
INTRAVENOUS | Status: AC
  Filled 2012-12-17: qty 50

## 2012-12-17 MED ORDER — SODIUM CHLORIDE 0.9 % IV BOLUS (SEPSIS)
1000.0000 mL | Freq: Once | INTRAVENOUS | Status: AC
  Administered 2012-12-17: 1000 mL via INTRAVENOUS

## 2012-12-17 MED ORDER — CIPROFLOXACIN IN D5W 400 MG/200ML IV SOLN
INTRAVENOUS | Status: AC
  Filled 2012-12-17: qty 200

## 2012-12-17 MED ORDER — CIPROFLOXACIN IN D5W 400 MG/200ML IV SOLN: 400.0000 mg | Freq: Two times a day (BID) | INTRAVENOUS | Status: DC

## 2012-12-17 MED ORDER — INSULIN ASPART 100 UNIT/ML ~~LOC~~ SOLN: 0.0000 [IU] | Freq: Every day | SUBCUTANEOUS | Status: DC

## 2012-12-17 MED ORDER — INSULIN ASPART 100 UNIT/ML ~~LOC~~ SOLN: 0.0000 [IU] | Freq: Three times a day (TID) | SUBCUTANEOUS | Status: DC

## 2012-12-17 MED ORDER — METRONIDAZOLE IN NACL 5-0.79 MG/ML-% IV SOLN
INTRAVENOUS | Status: AC
  Filled 2012-12-17: qty 200

## 2012-12-17 MED ORDER — TEMAZEPAM 15 MG PO CAPS
15.0000 mg | ORAL_CAPSULE | Freq: Every day | ORAL | Status: DC
  Administered 2012-12-17 – 2012-12-19 (×3): 15 mg via ORAL
  Filled 2012-12-17 (×3): qty 1

## 2012-12-17 NOTE — H&P (Signed)
[facility] History and Physical  [name] LZJ:[id number] DOB: Jun 08, 1965 DOA: 12/17/2012  Referring physician: Dr. Roderic Palau, ER physician PCP: Alonza Bogus, MD  Specialists:   Chief Complaint: Diarrhea  HPI: [name] is a 47 y.o. male who presents to the emergency room with one-week history of diarrhea. The patient reports having 8 bowel movements a day. Stools are reportedly high-volume/watery, no melena or hematochezia reported. He reports feeling occasionally feverish. He's not had any vomiting. He did have some generalized abdominal pain which has since resolved. He is feeling generally weak. Denies any shortness of breath or cough. He has had chest pain which he thinks may be GERD. He's noticed his urine is darker, but denies any dysuria. He's not had any sick contacts. He's not been eating out. He was evaluated in the emergency room where he was noted to be hypotensive and in acute renal failure with a creatinine greater than 6. He's been referred for admission.  Review of Systems: Pertinent positives as per history of present illness, otherwise negative  Past Medical History  Diagnosis Date  . Arteriosclerotic cardiovascular disease (ASCVD)     EF 60% -- Non-Q MI in 07/2008->  BMS to mid LAD  . Hypertension   . Hyperlipidemia   . Schizophrenia     Schizophrenia/bipolar disease/mental retardation  . Mild anemia     Result.  Nl H&H in 02/2011  . Diabetes mellitus, type 2     A1c of 5.6 in 05/2011  . Axillary abscess 2011    right  . Obesity   . Tobacco abuse     15 pack years; 0.5 pack per day  . Aortic insufficiency     mild to moderate   . Cerebrovascular disease     CVA  . Chronic obstructive pulmonary disease   . Sinus bradycardia     On low dose beta blocker  . Diabetes mellitus type II    Past Surgical History  Procedure Laterality Date  . Hernia repair  1967   Social History:  reports that he has been smoking Cigarettes.  He has a 15  pack-year smoking history. He has never used smokeless tobacco. He reports that he does not drink alcohol or use illicit drugs.  No Known Allergies  Family History  Problem Relation Age of Onset  . Hypertension    . Arthritis      Prior to Admission medications   Medication Sig Start Date End Date Taking? Authorizing Provider  amLODipine (NORVASC) 10 MG tablet Take 10 mg by mouth daily.     Yes Historical Provider, MD  aspirin EC 81 MG tablet Take 81 mg by mouth daily.   Yes Historical Provider, MD  buPROPion (WELLBUTRIN XL) 150 MG 24 hr tablet Take 150 mg by mouth 2 (two) times daily.   Yes Historical Provider, MD  CATAPRES-TTS-3 0.3 MG/24HR Place 1 patch (0.3 mg total) onto the skin once a week. 09/18/12  Yes Yehuda Savannah, MD  diclofenac sodium (VOLTAREN) 1 % GEL Apply 1 application topically 4 (four) times daily.     Yes Historical Provider, MD  fexofenadine-pseudoephedrine (ALLEGRA-D) 60-120 MG per tablet Take 1 tablet by mouth 2 (two) times daily as needed.   Yes Historical Provider, MD  fish oil-omega-3 fatty acids 1000 MG capsule Take 1 g by mouth 4 (four) times daily.     Yes Historical Provider, MD  haloperidol decanoate (HALDOL DECANOATE) 100 MG/ML injection Inject 100 mg into the muscle every 14 (  fourteen) days.     Yes Historical Provider, MD  insulin glargine (LANTUS) 100 UNIT/ML injection Inject 20 Units into the skin 2 (two) times daily.     Yes Historical Provider, MD  lisinopril (PRINIVIL,ZESTRIL) 40 MG tablet Take 40 mg by mouth daily.     Yes Historical Provider, MD  lithium carbonate 300 MG capsule Take 900 mg by mouth at bedtime.    Yes Historical Provider, MD  metoprolol tartrate (LOPRESSOR) 25 MG tablet Take 25 mg by mouth 2 (two) times daily.   Yes Historical Provider, MD  Multiple Vitamin (DAILY VITE PO) Take 1 tablet by mouth daily.   Yes Historical Provider, MD  naproxen sodium (ALEVE) 220 MG tablet Take 220 mg by mouth 2 (two) times daily with a meal.   Yes  Historical Provider, MD  potassium chloride SA (K-DUR,KLOR-CON) 20 MEQ tablet Take 20 mEq by mouth daily.     Yes Historical Provider, MD  pravastatin (PRAVACHOL) 40 MG tablet Take 40 mg by mouth daily.     Yes Historical Provider, MD  temazepam (RESTORIL) 15 MG capsule Take 15 mg by mouth at bedtime.     Yes Historical Provider, MD  buPROPion (ZYBAN) 150 MG 12 hr tablet Take 1 tablet (150 mg total) by mouth 2 (two) times daily. 09/19/11 09/06/12  Yehuda Savannah, MD   Physical Exam: Filed Vitals:   12/17/12 1815  BP: 106/43  Pulse: 70  Temp: 98.1 F (36.7 C)  Resp: 20     General:  No acute distress  Eyes: Pupils are equal, round, react to light  ENT: Mucous membranes are dry  Neck: Supple  Cardiovascular: S1, S2, regular rate and rhythm  Respiratory: Clear to auscultation bilaterally  Abdomen: Soft, nontender, positive bowel sounds  Skin: No rashes  Musculoskeletal: No pedal edema bilaterally  Psychiatric: No anxiety, speech is slow but appropriate  Neurologic: Grossly intact, nonfocal  Labs on Admission:  Basic Metabolic Panel:  Recent Labs Lab 12/17/12 1418  NA 133*  K 3.9  CL 103  CO2 16*  GLUCOSE 87  BUN 71*  CREATININE 6.01*  CALCIUM 9.8   Liver Function Tests:  Recent Labs Lab 12/17/12 1418  AST 8  ALT 6  ALKPHOS 80  BILITOT 0.3  PROT 6.8  ALBUMIN 3.2*   No results found for this basename: LIPASE, AMYLASE,  in the last 168 hours No results found for this basename: AMMONIA,  in the last 168 hours CBC:  Recent Labs Lab 12/17/12 1418  WBC 12.7*  NEUTROABS 9.0*  HGB 12.9*  HCT 38.4*  MCV 81.9  PLT 264   Cardiac Enzymes: No results found for this basename: CKTOTAL, CKMB, CKMBINDEX, TROPONINI,  in the last 168 hours  BNP (last 3 results) No results found for this basename: PROBNP,  in the last 8760 hours CBG: No results found for this basename: GLUCAP,  in the last 168 hours  Radiological Exams on Admission: No results  found.   Assessment/Plan Principal Problem:   Acute renal failure Active Problems:   Hypertension   Schizophrenia   Diabetes mellitus, type 2   Chronic kidney disease, stage 2, mildly decreased GFR   Dehydration   Diarrhea   Gastroenteritis   GERD (gastroesophageal reflux disease)   Metabolic acidosis   1. Acute renal failure. Patient is taking ARB as well as nonsteroidal anti-inflammatories at home. This compounded by his hypotension likely related to his renal failure due to acute tubular necrosis. Urinalysis does not show  any signs of infection. We'll check renal ultrasound. Monitor strict I.'s and os. Provide IV hydration and request nephrology consultation. Will hold ARB and nonsteroidal anti-inflammatories. 2. Diarrhea. Check stool for GI pathogen panel. Start empirically on ciprofloxacin and Flagyl. 3. Chest pain. Atypical, appears to be GERD. He does report having history of coronary disease with MI in the past. We'll cycle troponins. Protect the contents. Check EKG. 4. Metabolic acidosis, likely due to GI losses from diarrhea. Will add bicarbonate to IV fluids. 5. History of hypertension. Since he is hypotensive at this time, we will hold antihypertensives. He does not appear to be any clear source of infection he does not appear septic/toxic 6. Diabetes. Continue sliding scale insulin. Hold Lantus for now.  Patient will be admitted to the service of Dr. Luan Pulling. [facility] will be available for any issues until 7 AM on 9/16.   Code Status: full code Family Communication: discussed with patient Disposition Plan: discharge home once improved  Time spent: 10mins  Brown,Jeff [facility] Pager [phone]  If 7PM-7AM, please contact night-coverage [email or web address] Password [facility] 12/17/2012, 7:19 PM

## 2012-12-17 NOTE — ED Provider Notes (Signed)
CSN: [id number]     Arrival date & time 12/17/12  1216 History  This chart was scribed for Maudry Diego, MD by Roxan Diesel, ED scribe.  This patient was seen in room APA07/APA07 and the patient's care was started at 2:18 PM.   Chief Complaint  Patient presents with  . Diarrhea    Patient is a 47 y.o. male presenting with diarrhea. The history is provided by the patient. No language interpreter was used.  Diarrhea Quality:  Watery Number of episodes:  8 per day Duration:  1 week Associated symptoms: abdominal pain, fever (subjective) and myalgias (leg pain)     HPI Comments: [name] is a 47 y.o. male with h/o DM, HTN, hyperlipidemia, anemia, COPD, CVA, ASCVD and schizophrenia who presents to the Emergency Department complaining of one week of persistent diarrhea with associated leg pain, abdominal pain and generalized weakness.  Pt states he has been having 8 episodes per day of watery stool.  He also reports that he had a fever 2 nights ago "in the middle of the night."   He has not seen his PCP in regard to his symptoms.   Past Medical History  Diagnosis Date  . Arteriosclerotic cardiovascular disease (ASCVD)     EF 60% -- Non-Q MI in 07/2008->  BMS to mid LAD  . Hypertension   . Hyperlipidemia   . Schizophrenia     Schizophrenia/bipolar disease/mental retardation  . Mild anemia     Result.  Nl H&H in 02/2011  . Diabetes mellitus, type 2     A1c of 5.6 in 05/2011  . Axillary abscess 2011    right  . Obesity   . Tobacco abuse     15 pack years; 0.5 pack per day  . Aortic insufficiency     mild to moderate   . Cerebrovascular disease     CVA  . Chronic obstructive pulmonary disease   . Sinus bradycardia     On low dose beta blocker  . Diabetes mellitus type II     Past Surgical History  Procedure Laterality Date  . Hernia repair  1967    Family History  Problem Relation Age of Onset  . Hypertension    . Arthritis      History  Substance Use  Topics  . Smoking status: Current Some Day Smoker -- 0.50 packs/day for 30 years    Types: Cigarettes  . Smokeless tobacco: Never Used     Comment: Patient is not motivated to quit.  He attributes his smoking to the lack of other activities.  . Alcohol Use: No     Review of Systems  Constitutional: Positive for fever (subjective). Negative for appetite change and fatigue.  HENT: Negative for congestion, sinus pressure and ear discharge.   Eyes: Negative for discharge.  Respiratory: Negative for cough.   Cardiovascular: Negative for chest pain.  Gastrointestinal: Positive for abdominal pain and diarrhea.  Genitourinary: Negative for frequency and hematuria.  Musculoskeletal: Positive for myalgias (leg pain). Negative for back pain.       Leg pain  Skin: Negative for rash.  Neurological: Positive for weakness (generalized). Negative for seizures.  Psychiatric/Behavioral: Negative for hallucinations.     Allergies  Review of patient's allergies indicates no known allergies.  Home Medications   Current Outpatient Rx  Name  Route  Sig  Dispense  Refill  . AIMSCO INSULIN SYR ULTRA THIN 29G X 1/2" 0.5 ML MISC               .  amLODipine (NORVASC) 10 MG tablet   Oral   Take 10 mg by mouth daily.           . ASPIRIN LOW DOSE 81 MG EC tablet      TAKE ONE TABLET BY MOUTH ONCE DAILY.   30 tablet   6   . buPROPion (WELLBUTRIN SR) 150 MG 12 hr tablet      TAKE (1) TABLET BY MOUTH TWICE DAILY.   60 tablet   3   . EXPIRED: buPROPion (ZYBAN) 150 MG 12 hr tablet   Oral   Take 1 tablet (150 mg total) by mouth 2 (two) times daily.   60 tablet   12   . CATAPRES-TTS-3 0.3 MG/24HR   Transdermal   Place 1 patch (0.3 mg total) onto the skin once a week.   4 patch   12     Dispense as written.    DAW PER INSURANCE   . diclofenac sodium (VOLTAREN) 1 % GEL   Topical   Apply 1 application topically 4 (four) times daily.           . fish oil-omega-3 fatty acids 1000 MG  capsule   Oral   Take 1 g by mouth 4 (four) times daily.           . haloperidol decanoate (HALDOL DECANOATE) 100 MG/ML injection   Intramuscular   Inject 100 mg into the muscle every 14 (fourteen) days.           . insulin glargine (LANTUS) 100 UNIT/ML injection   Subcutaneous   Inject 20 Units into the skin 2 (two) times daily.           Marland Kitchen lisinopril (PRINIVIL,ZESTRIL) 40 MG tablet   Oral   Take 40 mg by mouth daily.           Marland Kitchen lithium carbonate 300 MG capsule   Oral   Take 900 mg by mouth at bedtime.          . metoprolol tartrate (LOPRESSOR) 25 MG tablet      TAKE 1 TABLET BY MOUTH TWICE DAILY.   60 tablet   6   . multivitamin (THERAGRAN) per tablet   Oral   Take 1 tablet by mouth daily.           . potassium chloride SA (K-DUR,KLOR-CON) 20 MEQ tablet   Oral   Take 20 mEq by mouth daily.           . pravastatin (PRAVACHOL) 40 MG tablet   Oral   Take 40 mg by mouth daily.           . temazepam (RESTORIL) 15 MG capsule   Oral   Take 15 mg by mouth at bedtime.            BP 100/51  Pulse 71  Temp(Src) 98.1 F (36.7 C) (Oral)  Resp 17  Ht 5' 9.5" (1.765 m)  Wt 189 lb (85.73 kg)  BMI 27.52 kg/m2  SpO2 99%  Physical Exam  Nursing note and vitals reviewed. Constitutional: He is oriented to person, place, and time. He appears well-developed.  HENT:  Head: Normocephalic.  Eyes: Conjunctivae and EOM are normal. No scleral icterus.  Neck: Neck supple. No thyromegaly present.  Cardiovascular: Normal rate and regular rhythm.  Exam reveals no gallop and no friction rub.   No murmur heard. Pulmonary/Chest: No stridor. He has no wheezes. He has no rales. He exhibits no tenderness.  Abdominal: He  exhibits no distension. There is tenderness. There is no rebound.  Mild generalized tenderness  Musculoskeletal: Normal range of motion. He exhibits no edema.  Lymphadenopathy:    He has no cervical adenopathy.  Neurological: He is oriented to person,  place, and time. Coordination normal.  Skin: No rash noted. No erythema.  Psychiatric: He has a normal mood and affect. His behavior is normal.    ED Course  Procedures (including critical care time)  DIAGNOSTIC STUDIES: Oxygen Saturation is 99% on room air, normal by my interpretation.    COORDINATION OF CARE: 2:23 PM-Discussed treatment plan which includes labs with pt at bedside and pt agreed to plan.    Labs Review Labs Reviewed  BASIC METABOLIC PANEL - Abnormal; Notable for the following:    Sodium 133 (*)    CO2 16 (*)    BUN 71 (*)    Creatinine, Ser 6.01 (*)    GFR calc non Af Amer 10 (*)    GFR calc Af Amer 11 (*)    All other components within normal limits  CBC WITH DIFFERENTIAL - Abnormal; Notable for the following:    WBC 12.7 (*)    Hemoglobin 12.9 (*)    HCT 38.4 (*)    Neutro Abs 9.0 (*)    All other components within normal limits  HEPATIC FUNCTION PANEL - Abnormal; Notable for the following:    Albumin 3.2 (*)    All other components within normal limits   Imaging Review No results found.  MDM  No diagnosis found.     Renal failure,   The chart was scribed for me under my direct supervision.  I personally performed the history, physical, and medical decision making and all procedures in the evaluation of this patient.Maudry Diego, MD 12/17/12 [phone]

## 2012-12-17 NOTE — Progress Notes (Signed)
Patient ordered foley by MD.  Explained procedure to patient of insertion of foley.  Gave patient time to consider foley.  When entering room to insert foley, patient refused foley at this time.  Patient voided in urinal.  Encouraged/instructed patient to keep using urinal to keep accurate output.

## 2012-12-17 NOTE — ED Notes (Addendum)
Diarrhea x 1 week, with vomiting.  Pain in rt knee,  Abd pain  Pt from [facility]

## 2012-12-18 ENCOUNTER — Inpatient Hospital Stay (HOSPITAL_COMMUNITY): Payer: Medicaid Other

## 2012-12-18 LAB — BASIC METABOLIC PANEL
BUN: 67 mg/dL — ABNORMAL HIGH (ref 6–23)
CO2: 16 mEq/L — ABNORMAL LOW (ref 19–32)
Calcium: 9.1 mg/dL (ref 8.4–10.5)
Chloride: 106 mEq/L (ref 96–112)
Creatinine, Ser: 4.25 mg/dL — ABNORMAL HIGH (ref 0.50–1.35)
GFR calc Af Amer: 17 mL/min — ABNORMAL LOW (ref >90)
GFR calc non Af Amer: 15 mL/min — ABNORMAL LOW (ref >90)
Glucose, Bld: 104 mg/dL — ABNORMAL HIGH (ref 70–99)
Potassium: 3.6 mEq/L (ref 3.5–5.1)
Sodium: 133 mEq/L — ABNORMAL LOW (ref 135–145)

## 2012-12-18 LAB — CBC
HCT: 32.7 % — ABNORMAL LOW (ref 39.0–52.0)
Hemoglobin: 11.1 g/dL — ABNORMAL LOW (ref 13.0–17.0)
MCH: 27.8 pg (ref 26.0–34.0)
MCHC: 33.9 g/dL (ref 30.0–36.0)
MCV: 82 fL (ref 78.0–100.0)
Platelets: 196 10*3/uL (ref 150–400)
RBC: 3.99 MIL/uL — ABNORMAL LOW (ref 4.22–5.81)
RDW: 15 % (ref 11.5–15.5)
WBC: 8.3 10*3/uL (ref 4.0–10.5)

## 2012-12-18 LAB — CREATININE, URINE, RANDOM: Creatinine, Urine: 49.98 mg/dL

## 2012-12-18 LAB — GLUCOSE, CAPILLARY
Glucose-Capillary: 102 mg/dL — ABNORMAL HIGH (ref 70–99)
Glucose-Capillary: 97 mg/dL (ref 70–99)
Glucose-Capillary: 95 mg/dL (ref 70–99)
Glucose-Capillary: 109 mg/dL — ABNORMAL HIGH (ref 70–99)

## 2012-12-18 LAB — TROPONIN I
Troponin I: <0.3 ng/mL (ref <0.30)
Troponin I: <0.3 ng/mL (ref <0.30)

## 2012-12-18 LAB — CLOSTRIDIUM DIFFICILE BY PCR: Toxigenic C. Difficile by PCR: NEGATIVE

## 2012-12-18 LAB — SODIUM, URINE, RANDOM: Sodium, Ur: 25 mEq/L

## 2012-12-18 MED ORDER — CIPROFLOXACIN HCL 250 MG PO TABS
500.0000 mg | ORAL_TABLET | Freq: Every day | ORAL | Status: DC
  Administered 2012-12-18 – 2012-12-19 (×2): 500 mg via ORAL
  Filled 2012-12-18 (×2): qty 2

## 2012-12-18 MED ORDER — SODIUM CHLORIDE 0.45 % IV SOLN
INTRAVENOUS | Status: DC
  Administered 2012-12-18 – 2012-12-20 (×3): via INTRAVENOUS
  Filled 2012-12-18 (×8): qty 1000

## 2012-12-18 MED ORDER — BUPROPION HCL ER (SR) 150 MG PO TB12
150.0000 mg | ORAL_TABLET | Freq: Two times a day (BID) | ORAL | Status: DC
  Administered 2012-12-18 – 2012-12-20 (×4): 150 mg via ORAL
  Filled 2012-12-18 (×6): qty 1

## 2012-12-18 MED ORDER — SODIUM CHLORIDE 0.45 % IV SOLN
INTRAVENOUS | Status: DC
  Administered 2012-12-18: 10:00:00 via INTRAVENOUS

## 2012-12-18 MED ORDER — METRONIDAZOLE 500 MG PO TABS
500.0000 mg | ORAL_TABLET | Freq: Three times a day (TID) | ORAL | Status: DC
  Administered 2012-12-18 – 2012-12-19 (×4): 500 mg via ORAL
  Filled 2012-12-18 (×4): qty 1

## 2012-12-18 NOTE — Progress Notes (Signed)
PHARMACIST - PHYSICIAN COMMUNICATION DR:   Luan Pulling CONCERNING: Antibiotic IV to Oral Route Change Policy  RECOMMENDATION: This patient is receiving Cipro & Flagyl by the intravenous route.  Based on criteria approved by the Pharmacy and Therapeutics Committee, the antibiotic(s) is/are being converted to the equivalent oral dose form(s).   DESCRIPTION: These criteria include:  Patient being treated for a respiratory tract infection, urinary tract infection, cellulitis or clostridium difficile associated diarrhea if on metronidazole  The patient is not neutropenic and does not exhibit a GI malabsorption state  The patient is eating (either orally or via tube) and/or has been taking other orally administered medications for a least 24 hours  The patient is improving clinically and has a Tmax < 100.5  If you have questions about this conversion, please contact the Pharmacy Department  [x]   [phone] )  [facility] []   [phone] )  [facility]  []   [phone] )  [facility] []   [phone] )  [place], PharmD, BCPS 12/18/2012@1 :00 PM

## 2012-12-18 NOTE — Progress Notes (Signed)
Subjective: He says he feels better. He had diarrhea and became dehydrated. He had acute renal failure on admission. He has no complaints today. He says his diarrhea has largely resolved. He still feels weak. He denies any chest pain.  Objective: Vital signs in last 24 hours: Temp:  [97.3 F (36.3 C)-98.4 F (36.9 C)] 97.6 F (36.4 C) (09/16 0450) Pulse Rate:  [55-73] 55 (09/16 0450) Resp:  [16-20] 16 (09/16 0450) BP: (79-129)/(40-64) 97/51 mmHg (09/16 0450) SpO2:  [98 %-100 %] 99 % (09/16 0450) Weight:  [85.73 kg (189 lb)-88.5 kg (195 lb 1.7 oz)] 88.5 kg (195 lb 1.7 oz) (09/15 2046) Weight change:  Last BM Date: 12/18/12  Intake/Output from previous day: 09/15 0701 - 09/16 0700 In: -  Out: 550 [Urine:550]  PHYSICAL EXAM General appearance: alert, cooperative, no distress and moderately obese Resp: clear to auscultation bilaterally Cardio: regular rate and rhythm, S1, S2 normal, no murmur, click, rub or gallop GI: Mildly diffusely tender with active bowel sounds Extremities: extremities normal, atraumatic, no cyanosis or edema  Lab Results:    Basic Metabolic Panel:  Recent Labs  12/17/12 1418 12/18/12 0241  NA 133* 133*  K 3.9 3.6  CL 103 106  CO2 16* 16*  GLUCOSE 87 104*  BUN 71* 67*  CREATININE 6.01* 4.25*  CALCIUM 9.8 9.1   Liver Function Tests:  Recent Labs  12/17/12 1418  AST 8  ALT 6  ALKPHOS 80  BILITOT 0.3  PROT 6.8  ALBUMIN 3.2*   No results found for this basename: LIPASE, AMYLASE,  in the last 72 hours No results found for this basename: AMMONIA,  in the last 72 hours CBC:  Recent Labs  12/17/12 1418 12/18/12 0241  WBC 12.7* 8.3  NEUTROABS 9.0*  --   HGB 12.9* 11.1*  HCT 38.4* 32.7*  MCV 81.9 82.0  PLT 264 196   Cardiac Enzymes:  Recent Labs  12/17/12 2111 12/17/12 2112 12/18/12 0241  CKTOTAL  --  30  --   TROPONINI <0.30  --  <0.30   BNP: No results found for this basename: PROBNP,  in the last 72 hours D-Dimer: No  results found for this basename: DDIMER,  in the last 72 hours CBG:  Recent Labs  12/17/12 2104 12/18/12 0755  GLUCAP 71 102*   Hemoglobin A1C: No results found for this basename: HGBA1C,  in the last 72 hours Fasting Lipid Panel: No results found for this basename: CHOL, HDL, LDLCALC, TRIG, CHOLHDL, LDLDIRECT,  in the last 72 hours Thyroid Function Tests: No results found for this basename: TSH, T4TOTAL, FREET4, T3FREE, THYROIDAB,  in the last 72 hours Anemia Panel: No results found for this basename: VITAMINB12, FOLATE, FERRITIN, TIBC, IRON, RETICCTPCT,  in the last 72 hours Coagulation: No results found for this basename: LABPROT, INR,  in the last 72 hours Urine Drug Screen: Drugs of Abuse     Component Value Date/Time   LABOPIA NONE DETECTED 01/29/2011 2136   [place] 01/29/2011 2136   LABBENZ NONE DETECTED 01/29/2011 2136   AMPHETMU NONE DETECTED 01/29/2011 2136   THCU NONE DETECTED 01/29/2011 2136   LABBARB NONE DETECTED 01/29/2011 2136    Alcohol Level: No results found for this basename: ETH,  in the last 72 hours Urinalysis:  Recent Labs  12/17/12 1644  COLORURINE YELLOW  LABSPEC >1.030*  PHURINE 5.5  GLUCOSEU NEGATIVE  HGBUR NEGATIVE  BILIRUBINUR SMALL*  KETONESUR TRACE*  PROTEINUR NEGATIVE  UROBILINOGEN 0.2  NITRITE NEGATIVE  LEUKOCYTESUR NEGATIVE   Misc. Labs:  ABGS No results found for this basename: PHART, PCO2, PO2ART, TCO2, HCO3,  in the last 72 hours CULTURES No results found for this or any previous visit (from the past 240 hour(s)). Studies/Results: No results found.  Medications:  Prior to Admission:  Prescriptions prior to admission  Medication Sig Dispense Refill  . amLODipine (NORVASC) 10 MG tablet Take 10 mg by mouth daily.        Marland Kitchen aspirin EC 81 MG tablet Take 81 mg by mouth daily.      Marland Kitchen buPROPion (WELLBUTRIN XL) 150 MG 24 hr tablet Take 150 mg by mouth 2 (two) times daily.      Marland Kitchen CATAPRES-TTS-3 0.3 MG/24HR  Place 1 patch (0.3 mg total) onto the skin once a week.  4 patch  12  . diclofenac sodium (VOLTAREN) 1 % GEL Apply 1 application topically 4 (four) times daily.        . fexofenadine-pseudoephedrine (ALLEGRA-D) 60-120 MG per tablet Take 1 tablet by mouth 2 (two) times daily as needed.      . fish oil-omega-3 fatty acids 1000 MG capsule Take 1 g by mouth 4 (four) times daily.        . haloperidol decanoate (HALDOL DECANOATE) 100 MG/ML injection Inject 100 mg into the muscle every 14 (fourteen) days.        . insulin glargine (LANTUS) 100 UNIT/ML injection Inject 20 Units into the skin 2 (two) times daily.        Marland Kitchen lisinopril (PRINIVIL,ZESTRIL) 40 MG tablet Take 40 mg by mouth daily.        Marland Kitchen lithium carbonate 300 MG capsule Take 900 mg by mouth at bedtime.       . metoprolol tartrate (LOPRESSOR) 25 MG tablet Take 25 mg by mouth 2 (two) times daily.      . Multiple Vitamin (DAILY VITE PO) Take 1 tablet by mouth daily.      . naproxen sodium (ALEVE) 220 MG tablet Take 220 mg by mouth 2 (two) times daily with a meal.      . potassium chloride SA (K-DUR,KLOR-CON) 20 MEQ tablet Take 20 mEq by mouth daily.        . pravastatin (PRAVACHOL) 40 MG tablet Take 40 mg by mouth daily.        . temazepam (RESTORIL) 15 MG capsule Take 15 mg by mouth at bedtime.        Marland Kitchen buPROPion (ZYBAN) 150 MG 12 hr tablet Take 1 tablet (150 mg total) by mouth 2 (two) times daily.  60 tablet  12   Scheduled: . aspirin EC  81 mg Oral Daily  . buPROPion  150 mg Oral BID  . ciprofloxacin  400 mg Intravenous Q24H  . enoxaparin (LOVENOX) injection  30 mg Subcutaneous Q24H  . insulin aspart  0-5 Units Subcutaneous QHS  . insulin aspart  0-9 Units Subcutaneous TID WC  . metronidazole  500 mg Intravenous Q8H  . pantoprazole  40 mg Oral Daily  . simvastatin  20 mg Oral q1800  . temazepam  15 mg Oral QHS   Continuous: .  sodium bicarbonate  infusion 1000 mL 100 mL/hr at 12/17/12 2226   DGL:OVFIEPPIRJJOA, acetaminophen,  ondansetron (ZOFRAN) IV, ondansetron  Assesment: He was admitted with acute renal failure. This is probably multifactorial. He does have chronic kidney disease as well. He has coronary artery occlusive disease but no symptoms now. He has GERD and I think that is pretty stable.  He was admitted with diarrhea and he says that has largely resolved but I will check for C. difficile. He has hypertension which is been difficult to control. He has diabetes which is doing fairly well now. His dehydration seems better. His renal function has improved. All of this is complicated by the fact that he has schizophrenia Principal Problem:   Acute renal failure Active Problems:   Hypertension   Schizophrenia   Diabetes mellitus, type 2   Chronic kidney disease, stage 2, mildly decreased GFR   Dehydration   Diarrhea   Gastroenteritis   GERD (gastroesophageal reflux disease)   Metabolic acidosis    Plan: Continue current treatments. He will have nephrology consult. Repeat laboratory work in the morning    LOS: 1 day   Dreana Britz L 12/18/2012, 8:28 AM

## 2012-12-18 NOTE — Consult Note (Signed)
[name] MRN: [id number] DOB/AGE: 49-Apr-1967 49 y.o. Primary Care Physician:HAWKINS,EDWARD L, MD Admit date: 12/17/2012 Chief Complaint:  Chief Complaint  Patient presents with  . Diarrhea   HPI:  Pt is 47 year old male with past medical hx of HTN, DM who came to ER with c/o Diarrhea. HPI dates back to 10 days ago when pt started havnig abdominal pain and diarrhea. Pt had NO c/o blood in stoll C/o Vomiting. NO blood Pt c/o dizziness but No syncope. Pt continued to take his meds as pescribed. Pt came to ER and was found to be hypotensive and in AKI. Pt was admitted for further are.  NO c/o chest pain NO c/o fever/cough/chiils  No c/o hematuria    Past Medical History  Diagnosis Date  . Arteriosclerotic cardiovascular disease (ASCVD)     EF 60% -- Non-Q MI in 07/2008->  BMS to mid LAD  . Hypertension   . Hyperlipidemia   . Schizophrenia     Schizophrenia/bipolar disease/mental retardation  . Mild anemia     Result.  Nl H&H in 02/2011  . Diabetes mellitus, type 2     A1c of 5.6 in 05/2011  . Axillary abscess 2011    right  . Obesity   . Tobacco abuse     15 pack years; 0.5 pack per day  . Aortic insufficiency     mild to moderate   . Cerebrovascular disease     CVA  . Chronic obstructive pulmonary disease   . Sinus bradycardia     On low dose beta blocker  . Diabetes mellitus type II         Family History  Problem Relation Age of Onset  . Hypertension    . Arthritis    Positive hx of ESRD- Mother on HD   Social History:  reports that he has been smoking Cigarettes.  He has a 15 pack-year smoking history. He has never used smokeless tobacco. He reports that he does not drink alcohol or use illicit drugs.   Allergies: No Known Allergies  Medications Prior to Admission  Medication Sig Dispense Refill  . amLODipine (NORVASC) 10 MG tablet Take 10 mg by mouth daily.        Marland Kitchen aspirin EC 81 MG tablet Take 81 mg by mouth daily.      Marland Kitchen buPROPion  (WELLBUTRIN XL) 150 MG 24 hr tablet Take 150 mg by mouth 2 (two) times daily.      Marland Kitchen CATAPRES-TTS-3 0.3 MG/24HR Place 1 patch (0.3 mg total) onto the skin once a week.  4 patch  12  . diclofenac sodium (VOLTAREN) 1 % GEL Apply 1 application topically 4 (four) times daily.        . fexofenadine-pseudoephedrine (ALLEGRA-D) 60-120 MG per tablet Take 1 tablet by mouth 2 (two) times daily as needed.      . fish oil-omega-3 fatty acids 1000 MG capsule Take 1 g by mouth 4 (four) times daily.        . haloperidol decanoate (HALDOL DECANOATE) 100 MG/ML injection Inject 100 mg into the muscle every 14 (fourteen) days.        . insulin glargine (LANTUS) 100 UNIT/ML injection Inject 20 Units into the skin 2 (two) times daily.        Marland Kitchen lisinopril (PRINIVIL,ZESTRIL) 40 MG tablet Take 40 mg by mouth daily.        Marland Kitchen lithium carbonate 300 MG capsule Take 900 mg by mouth at bedtime.       Marland Kitchen  metoprolol tartrate (LOPRESSOR) 25 MG tablet Take 25 mg by mouth 2 (two) times daily.      . Multiple Vitamin (DAILY VITE PO) Take 1 tablet by mouth daily.      . naproxen sodium (ALEVE) 220 MG tablet Take 220 mg by mouth 2 (two) times daily with a meal.      . potassium chloride SA (K-DUR,KLOR-CON) 20 MEQ tablet Take 20 mEq by mouth daily.        . pravastatin (PRAVACHOL) 40 MG tablet Take 40 mg by mouth daily.        . temazepam (RESTORIL) 15 MG capsule Take 15 mg by mouth at bedtime.        Marland Kitchen buPROPion (ZYBAN) 150 MG 12 hr tablet Take 1 tablet (150 mg total) by mouth 2 (two) times daily.  60 tablet  12       VEL:FYBOF from the symptoms mentioned above,there are no other symptoms referable to all systems reviewed.  Marland Kitchen aspirin EC  81 mg Oral Daily  . buPROPion  150 mg Oral BID  . ciprofloxacin  400 mg Intravenous Q24H  . enoxaparin (LOVENOX) injection  30 mg Subcutaneous Q24H  . insulin aspart  0-5 Units Subcutaneous QHS  . insulin aspart  0-9 Units Subcutaneous TID WC  . metronidazole  500 mg Intravenous Q8H  .  pantoprazole  40 mg Oral Daily  . simvastatin  20 mg Oral q1800  . temazepam  15 mg Oral QHS      Physical Exam: Vital signs in last 24 hours: Temp:  [97.3 F (36.3 C)-98.4 F (36.9 C)] 97.6 F (36.4 C) (09/16 0450) Pulse Rate:  [55-73] 55 (09/16 0450) Resp:  [16-20] 16 (09/16 0450) BP: (79-129)/(40-64) 97/51 mmHg (09/16 0450) SpO2:  [98 %-100 %] 99 % (09/16 0450) Weight:  [189 lb (85.73 kg)-195 lb 1.7 oz (88.5 kg)] 195 lb 1.7 oz (88.5 kg) (09/15 2046) Weight change:  Last BM Date: 12/18/12  Intake/Output from previous day: 09/15 0701 - 09/16 0700 In: -  Out: 550 [Urine:550]     Physical Exam: General- pt is awake,alert, oriented to time place and person Resp- No acute REsp distress, CTA B/L NO Rhonchi CVS- S1S2 regular in rate and rhythm GIT- BS+, soft, NT, ND EXT- NO LE Edema, Cyanosis CNS- CN 2-12 grossly intact. Moving all 4 extremities Psych- normal mood and flat affect    Lab Results: CBC  Recent Labs  12/17/12 1418 12/18/12 0241  WBC 12.7* 8.3  HGB 12.9* 11.1*  HCT 38.4* 32.7*  PLT 264 196    BMET  Recent Labs  12/17/12 1418 12/18/12 0241  NA 133* 133*  K 3.9 3.6  CL 103 106  CO2 16* 16*  GLUCOSE 87 104*  BUN 71* 67*  CREATININE 6.01* 4.25*  CALCIUM 9.8 9.1    Trend Creat 2014 1.33==>6.01==>4.25  2012  1.28 2011   1.4 --2.83 ( AKI) 2010   1.0--1.3 2008   1.41--1.74     Lab Results  Component Value Date   CALCIUM 9.1 12/18/2012      Impression: 1)Renal  AKI secondary to Prerenal/ ATN                AKI secondary to Hypovolemia/ Hypotension/ACE/NSAIDS                 AKI improving                 Creat trending down  2)Hypotesnion BP better  3)Anemia HGb at goal (9--11)  4)GI admitted with Diarrhea  5)DM- Being folowed by primary team  6)Electrolytes Normokalemic  Hyponatremic Sec to Hypovolemia Stable  7)Acid base Co2 NOt  at goal  Anion Gap 133-122=11 Alb 3.2  Delta AG 11-9=2 Delta  Bicarb 24-16=8  NON AG acidosis Bicarb loss sec to Diarrhea   Plan:  Will ask for renal u/s Will ask for Fena Will change IVF NS to 1/2 NS + 75 meq Bicarb Will follow Bmet Agree with Holding ACE/ Nsaids  Aviod nephrotoxics      BHUTANI,MANPREET S 12/18/2012, 8:53 AM

## 2012-12-18 NOTE — Progress Notes (Signed)
UR Chart Review Completed  

## 2012-12-18 NOTE — Clinical Social Work Psychosocial (Signed)
Clinical Social Work Department BRIEF PSYCHOSOCIAL ASSESSMENT 12/18/2012  Patient:  [name], [name]     Account Number:  [id number]     Admit date:  12/17/2012  Clinical Social Worker:  Wyatt Haste  Date/Time:  12/18/2012 09:20 AM  Referred by:  CSW  Date Referred:  12/18/2012 Referred for  ALF Placement   Other Referral:   Interview type:  Patient Other interview type:   Gaspar Skeeters- facility    PSYCHOSOCIAL DATA Living Status:  FACILITY Admitted from facility:  OTHER Level of care:  Assisted Living Primary support name:  Oris Drone Primary support relationship to patient:  PARENT Degree of support available:   adequate    CURRENT CONCERNS Current Concerns  Post-Acute Placement   Other Concerns:    SOCIAL WORK ASSESSMENT / PLAN CSW met with pt at bedside. Pt alert and oriented to self and place. He reports he has been a resident at [facility] for many years. Pt's mother has had her own health issues, but is involved per facility. Gaspar Skeeters, supervisor in charge at [employer], reports that pt requires some assistance with bathing but is otherwise independent. He is seen every few weeks by Dr. Hoyle Barr at [facility]. Facility indicates pt has lost 18 pounds in the last two weeks and started having diarrhea. Okay to return at d/c.   Assessment/plan status:  Psychosocial Support/Ongoing Assessment of Needs Other assessment/ plan:   Information/referral to community resources:   [facility]    PATIENT'S/FAMILY'S RESPONSE TO PLAN OF CARE: Pt agreeable to return to [facility], but said sometimes he would like to be "on my own." CSW will continue to follow.       Benay Pike, [place]

## 2012-12-19 LAB — BASIC METABOLIC PANEL
BUN: 42 mg/dL — ABNORMAL HIGH (ref 6–23)
CO2: 20 mEq/L (ref 19–32)
Calcium: 9.5 mg/dL (ref 8.4–10.5)
Chloride: 115 mEq/L — ABNORMAL HIGH (ref 96–112)
Creatinine, Ser: 2.33 mg/dL — ABNORMAL HIGH (ref 0.50–1.35)
GFR calc Af Amer: 36 mL/min — ABNORMAL LOW (ref >90)
GFR calc non Af Amer: 31 mL/min — ABNORMAL LOW (ref >90)
Glucose, Bld: 85 mg/dL (ref 70–99)
Potassium: 3.9 mEq/L (ref 3.5–5.1)
Sodium: 143 mEq/L (ref 135–145)

## 2012-12-19 LAB — GI PATHOGEN PANEL BY PCR, STOOL
C difficile toxin A/B: NEGATIVE
Campylobacter by PCR: NEGATIVE
Cryptosporidium by PCR: NEGATIVE
E coli (ETEC) LT/ST: NEGATIVE
E coli (STEC): NEGATIVE
E coli 0157 by PCR: NEGATIVE
G lamblia by PCR: NEGATIVE
Norovirus GI/GII: NEGATIVE
Rotavirus A by PCR: NEGATIVE
Salmonella by PCR: NEGATIVE
Shigella by PCR: NEGATIVE

## 2012-12-19 LAB — CBC WITH DIFFERENTIAL/PLATELET
Basophils Absolute: 0 10*3/uL (ref 0.0–0.1)
Basophils Relative: 0 % (ref 0–1)
Eosinophils Absolute: 0.5 10*3/uL (ref 0.0–0.7)
Eosinophils Relative: 7 % — ABNORMAL HIGH (ref 0–5)
HCT: 32.7 % — ABNORMAL LOW (ref 39.0–52.0)
Hemoglobin: 11 g/dL — ABNORMAL LOW (ref 13.0–17.0)
Lymphocytes Relative: 24 % (ref 12–46)
Lymphs Abs: 1.7 10*3/uL (ref 0.7–4.0)
MCH: 27.8 pg (ref 26.0–34.0)
MCHC: 33.6 g/dL (ref 30.0–36.0)
MCV: 82.6 fL (ref 78.0–100.0)
Monocytes Absolute: 0.5 10*3/uL (ref 0.1–1.0)
Monocytes Relative: 7 % (ref 3–12)
Neutro Abs: 4.3 10*3/uL (ref 1.7–7.7)
Neutrophils Relative %: 62 % (ref 43–77)
Platelets: 207 10*3/uL (ref 150–400)
RBC: 3.96 MIL/uL — ABNORMAL LOW (ref 4.22–5.81)
RDW: 15 % (ref 11.5–15.5)
WBC: 7 10*3/uL (ref 4.0–10.5)

## 2012-12-19 LAB — GLUCOSE, CAPILLARY
Glucose-Capillary: 79 mg/dL (ref 70–99)
Glucose-Capillary: 84 mg/dL (ref 70–99)
Glucose-Capillary: 83 mg/dL (ref 70–99)
Glucose-Capillary: 115 mg/dL — ABNORMAL HIGH (ref 70–99)

## 2012-12-19 NOTE — Progress Notes (Signed)
Subjective: He says he feels better. He has no new complaints. He wants to get up.  Objective: Vital signs in last 24 hours: Temp:  [97.9 F (36.6 C)-98.5 F (36.9 C)] 98.3 F (36.8 C) (09/17 0527) Pulse Rate:  [54-70] 54 (09/17 0527) Resp:  [18] 18 (09/17 0527) BP: (110-165)/(66-74) 157/73 mmHg (09/17 0527) SpO2:  [100 %] 100 % (09/17 0527) Weight change:  Last BM Date: 12/18/12  Intake/Output from previous day: 09/16 0701 - 09/17 0700 In: 2823.3 [P.O.:960; I.V.:1863.3] Out: 4275 [Urine:4275]  PHYSICAL EXAM General appearance: alert, cooperative and no distress Resp: clear to auscultation bilaterally Cardio: regular rate and rhythm, S1, S2 normal, no murmur, click, rub or gallop GI: soft, non-tender; bowel sounds normal; no masses,  no organomegaly Extremities: extremities normal, atraumatic, no cyanosis or edema  Lab Results:    Basic Metabolic Panel:  Recent Labs  12/18/12 0241 12/19/12 0448  NA 133* 143  K 3.6 3.9  CL 106 115*  CO2 16* 20  GLUCOSE 104* 85  BUN 67* 42*  CREATININE 4.25* 2.33*  CALCIUM 9.1 9.5   Liver Function Tests:  Recent Labs  12/17/12 1418  AST 8  ALT 6  ALKPHOS 80  BILITOT 0.3  PROT 6.8  ALBUMIN 3.2*   No results found for this basename: LIPASE, AMYLASE,  in the last 72 hours No results found for this basename: AMMONIA,  in the last 72 hours CBC:  Recent Labs  12/17/12 1418 12/18/12 0241 12/19/12 0448  WBC 12.7* 8.3 7.0  NEUTROABS 9.0*  --  4.3  HGB 12.9* 11.1* 11.0*  HCT 38.4* 32.7* 32.7*  MCV 81.9 82.0 82.6  PLT 264 196 207   Cardiac Enzymes:  Recent Labs  12/17/12 2111 12/17/12 2112 12/18/12 0241 12/18/12 0941  CKTOTAL  --  30  --   --   TROPONINI <0.30  --  <0.30 <0.30   BNP: No results found for this basename: PROBNP,  in the last 72 hours D-Dimer: No results found for this basename: DDIMER,  in the last 72 hours CBG:  Recent Labs  12/17/12 2104 12/18/12 0755 12/18/12 1123 12/18/12 1645  12/18/12 2134 12/19/12 0742  GLUCAP 71 102* 97 95 109* 79   Hemoglobin A1C: No results found for this basename: HGBA1C,  in the last 72 hours Fasting Lipid Panel: No results found for this basename: CHOL, HDL, LDLCALC, TRIG, CHOLHDL, LDLDIRECT,  in the last 72 hours Thyroid Function Tests: No results found for this basename: TSH, T4TOTAL, FREET4, T3FREE, THYROIDAB,  in the last 72 hours Anemia Panel: No results found for this basename: VITAMINB12, FOLATE, FERRITIN, TIBC, IRON, RETICCTPCT,  in the last 72 hours Coagulation: No results found for this basename: LABPROT, INR,  in the last 72 hours Urine Drug Screen: Drugs of Abuse     Component Value Date/Time   LABOPIA NONE DETECTED 01/29/2011 2136   [place] 01/29/2011 2136   LABBENZ NONE DETECTED 01/29/2011 2136   AMPHETMU NONE DETECTED 01/29/2011 2136   THCU NONE DETECTED 01/29/2011 2136   LABBARB NONE DETECTED 01/29/2011 2136    Alcohol Level: No results found for this basename: ETH,  in the last 72 hours Urinalysis:  Recent Labs  12/17/12 [place]  LABSPEC >1.030*  PHURINE 5.5  GLUCOSEU NEGATIVE  HGBUR NEGATIVE  BILIRUBINUR SMALL*  KETONESUR TRACE*  PROTEINUR NEGATIVE  UROBILINOGEN 0.2  NITRITE NEGATIVE  [place]. Labs:  ABGS No results found for this basename: PHART, PCO2,  PO2ART, TCO2, HCO3,  in the last 72 hours CULTURES Recent Results (from the past 240 hour(s))  CLOSTRIDIUM DIFFICILE BY PCR     Status: None   Collection Time    12/18/12 12:30 PM      Result Value Range Status   C difficile by pcr NEGATIVE  NEGATIVE Final   Studies/Results: US Renal  12/18/2012   *RADIOLOGY REPORT*  Clinical Data: History of acute renal failure.  RENAL/URINARY TRACT ULTRASOUND COMPLETE  Comparison:  CT 12/23/2006.  CT 12/24/2006.  Findings:  Right Kidney: Right renal length is 10.1 cm.  Left Kidney:  Left renal length is 11.0 cm.  There is slight increased  echogenicity of the renal parenchyma bilaterally.  This may be associated with medical renal disease.  Examination of each kidney shows no evidence of hydronephrosis, solid or cystic mass, calculus, or parenchymal loss.  Bladder:  No urinary bladder abnormality was identified.  No ureteral jets were visualized.  IMPRESSION: Kidneys are normal size with no evidence of hydronephrosis, mass, or significant parenchymal loss.  There is slight increased echogenicity of the renal parenchyma bilaterally which may be associated with medical renal disease.   Original Report Authenticated By: Shanon Brow Call    Medications:  Prior to Admission:  Prescriptions prior to admission  Medication Sig Dispense Refill  . amLODipine (NORVASC) 10 MG tablet Take 10 mg by mouth daily.        Marland Kitchen aspirin EC 81 MG tablet Take 81 mg by mouth daily.      Marland Kitchen buPROPion (WELLBUTRIN SR) 150 MG 12 hr tablet Take 150 mg by mouth 2 (two) times daily.      Marland Kitchen CATAPRES-TTS-3 0.3 MG/24HR Place 1 patch (0.3 mg total) onto the skin once a week.  4 patch  12  . diclofenac sodium (VOLTAREN) 1 % GEL Apply 1 application topically 4 (four) times daily.        . fexofenadine-pseudoephedrine (ALLEGRA-D) 60-120 MG per tablet Take 1 tablet by mouth 2 (two) times daily as needed.      . fish oil-omega-3 fatty acids 1000 MG capsule Take 1 g by mouth 4 (four) times daily.        . haloperidol decanoate (HALDOL DECANOATE) 100 MG/ML injection Inject 100 mg into the muscle every 14 (fourteen) days.        . insulin glargine (LANTUS) 100 UNIT/ML injection Inject 20 Units into the skin 2 (two) times daily.        Marland Kitchen lisinopril (PRINIVIL,ZESTRIL) 40 MG tablet Take 40 mg by mouth daily.        Marland Kitchen lithium carbonate 300 MG capsule Take 900 mg by mouth at bedtime.       . metoprolol tartrate (LOPRESSOR) 25 MG tablet Take 25 mg by mouth 2 (two) times daily.      . Multiple Vitamin (DAILY VITE PO) Take 1 tablet by mouth daily.      . naproxen sodium (ALEVE) 220 MG  tablet Take 220 mg by mouth 2 (two) times daily with a meal.      . potassium chloride SA (K-DUR,KLOR-CON) 20 MEQ tablet Take 20 mEq by mouth daily.        . pravastatin (PRAVACHOL) 40 MG tablet Take 40 mg by mouth daily.        . temazepam (RESTORIL) 15 MG capsule Take 15 mg by mouth at bedtime.        Marland Kitchen buPROPion (ZYBAN) 150 MG 12 hr tablet Take 1 tablet (150 mg total)  by mouth 2 (two) times daily.  60 tablet  12   Scheduled: . aspirin EC  81 mg Oral Daily  . buPROPion  150 mg Oral BID  . ciprofloxacin  500 mg Oral QHS  . enoxaparin (LOVENOX) injection  30 mg Subcutaneous Q24H  . insulin aspart  0-5 Units Subcutaneous QHS  . insulin aspart  0-9 Units Subcutaneous TID WC  . metroNIDAZOLE  500 mg Oral Q8H  . pantoprazole  40 mg Oral Daily  . simvastatin  20 mg Oral q1800  . temazepam  15 mg Oral QHS   Continuous: . sodium chloride 0.45 % 1,000 mL with sodium bicarbonate 75 mEq infusion 125 mL/hr at 12/18/12 1610   ACZ:[id number], acetaminophen, ondansetron (ZOFRAN) IV, ondansetron  Assesment: He was admitted with acute on chronic renal failure. This appears to be from dehydration related to diarrhea. He has coronary artery occlusive disease but no symptoms. He has pretty severe hypertension and his blood pressures running in the 150s. He's off some of his meds. He has diabetes and that's doing fairly well his renal function has improved markedly Principal Problem:   Acute renal failure Active Problems:   Hypertension   Schizophrenia   Diabetes mellitus, type 2   Chronic kidney disease, stage 2, mildly decreased GFR   Dehydration   Diarrhea   Gastroenteritis   GERD (gastroesophageal reflux disease)   Metabolic acidosis    Plan: Continue current treatments. He may need more blood pressure medication but outside about that after I see what he does today. He probably could go home tomorrow    LOS: 2 days   [name] 12/19/2012, 8:35 AM

## 2012-12-19 NOTE — Progress Notes (Signed)
[name]  MRN: [id number]  DOB/AGE: Mar 03, 1966 47 y.o.  Primary Care Physician:HAWKINS,Jeff L, MD  Admit date: 12/17/2012  Chief Complaint:  Chief Complaint  Patient presents with  . Diarrhea    [name]-Pt presented on  12/17/2012 with  Chief Complaint  Patient presents with  . Diarrhea  .    Pt today feels better   Pt has c/o diarrhea   Meds . aspirin EC  81 mg Oral Daily  . buPROPion  150 mg Oral BID  . ciprofloxacin  500 mg Oral QHS  . enoxaparin (LOVENOX) injection  30 mg Subcutaneous Q24H  . insulin aspart  0-5 Units Subcutaneous QHS  . insulin aspart  0-9 Units Subcutaneous TID WC  . metroNIDAZOLE  500 mg Oral Q8H  . pantoprazole  40 mg Oral Daily  . simvastatin  20 mg Oral q1800  . temazepam  15 mg Oral QHS      Physical Exam:  Vital signs in last 24 hours: Temp:  [97.9 F (36.6 C)-98.5 F (36.9 C)] 98.3 F (36.8 C) (09/17 0527) Pulse Rate:  [54-70] 54 (09/17 0527) Resp:  [18] 18 (09/17 0527) BP: (110-165)/(66-74) 157/73 mmHg (09/17 0527) SpO2:  [100 %] 100 % (09/17 0527) Weight change:  Last BM Date: 12/18/12  Intake/Output from previous day: 09/16 0701 - 09/17 0700 In: 2823.3 [P.O.:960; I.V.:1863.3] Out: 4275 [Urine:4275]     Physical Exam: General- pt is awake,alert, oriented to time place and person Resp- No acute REsp distress, CTA B/[name] NO Rhonchi CVS- S1S2 regular ij rate and rhythm GIT- BS+, soft, NT, ND EXT- NO LE Edema, Cyanosis   Lab Results: CBC  Recent Labs  12/18/12 0241 12/19/12 0448  WBC 8.3 7.0  HGB 11.1* 11.0*  HCT 32.7* 32.7*  PLT 196 207    BMET  Recent Labs  12/18/12 0241 12/19/12 0448  NA 133* 143  K 3.6 3.9  CL 106 115*  CO2 16* 20  GLUCOSE 104* 85  BUN 67* 42*  CREATININE 4.25* 2.33*  CALCIUM 9.1 9.5   Trend Creat  2014 1.33==>6.01==>4.25==> 2.33 2012 1.28  2011 1.4 --2.83 ( AKI)  2010 1.0--1.3  2008 1.41--1.74   Trend CO2  16==>20  MICRO Recent Results (from the past 240 hour([name]))   CLOSTRIDIUM DIFFICILE BY PCR     Status: None   Collection Time    12/18/12 12:30 PM      Result Value Range Status   C difficile by pcr NEGATIVE  NEGATIVE Final      Lab Results  Component Value Date   CALCIUM 9.5 12/19/2012     Right renal length is 10.1 cm.  Left Kidney: Left renal length is 11.0 cm.  There is slight increased echogenicity of the renal parenchyma  bilaterally. This may be associated with medical renal disease.  Examination of each kidney shows no evidence of hydronephrosis,  solid or cystic mass, calculus, or parenchymal loss.        Impression: 11)Renal AKI secondary to Prerenal/ ATN  AKI secondary to Hypovolemia/ Hypotension/ACE/NSAIDS  AKI continues to  improve Creat trending down   2)Hypotesnion  BP better   3)Anemia HGb at goal (9--11)   4)GI admitted with Diarrhea  Now better  5)DM- Being folowed by primary team   6)Electrolytes  Normokalemic   Hyponatremic  Sec to Hypovolemia  Now better  7)Acid base   Co2 now at goal  Plan:  Will reduce IVf rate Will follow bmet  [name] 12/19/2012, 9:45 AM

## 2012-12-19 NOTE — Evaluation (Signed)
Physical Therapy Evaluation Patient Details Name: [name] MRN: [id number] DOB: 1965-06-08 Today's Date: 12/19/2012 Time: 0950-1010 PT Time Calculation (min): 20 min  PT Assessment / Plan / Recommendation History of Present Illness   Pt is a 47 yo male who lives in a group home.  He is admitted to [facility] with acute renal failure  Clinical Impression  Pt is I in bed mobility; I in ambulation.  Pt  Has no need for skilled PT    PT Assessment  Patent does not need any further PT services    Follow Up Recommendations   none    Does the patient have the potential to tolerate intense rehabilitation    N/A  Barriers to Discharge  none      Equipment Recommendations    none   Recommendations for Other Services   none  Frequency   nA   Precautions / Restrictions Precautions Precautions: None Restrictions Weight Bearing Restrictions: No          Mobility  Bed Mobility Bed Mobility: Supine to Sit Supine to Sit: 7: Independent Transfers Transfers: Sit to Stand Sit to Stand: 7: Independent Ambulation/Gait Ambulation/Gait Assistance: 7: Independent Ambulation Distance (Feet): 300 Feet Assistive device: None Gait Pattern: Within Functional Limits Gait velocity: normal    Exercises     PT Diagnosis:   none PT Problem List:  none PT Treatment Interventions:   none    PT Goals(Current goals can be found in the care plan section) Acute Rehab PT Goals PT Goal Formulation: No goals set, d/c therapy  Visit Information  Last PT Received On: 12/19/12       Prior Functioning  Home Living Family/patient expects to be discharged to:: Group home Prior Function Level of Independence: Independent Communication Communication: No difficulties    Cognition  Cognition Arousal/Alertness: Awake/alert Overall Cognitive Status: Within Functional Limits for tasks assessed    Extremity/Trunk Assessment Lower Extremity Assessment Lower Extremity Assessment: Overall  WFL for tasks assessed   Balance    End of Session PT - End of Session Equipment Utilized During Treatment: Gait belt Activity Tolerance: Patient tolerated treatment well  GP     Aislin Onofre,CINDY 12/19/2012, 10:12 AM

## 2012-12-20 LAB — GLUCOSE, CAPILLARY
Glucose-Capillary: 79 mg/dL (ref 70–99)
Glucose-Capillary: 94 mg/dL (ref 70–99)

## 2012-12-20 LAB — BASIC METABOLIC PANEL
BUN: 26 mg/dL — ABNORMAL HIGH (ref 6–23)
CO2: 22 mEq/L (ref 19–32)
Calcium: 9.2 mg/dL (ref 8.4–10.5)
Chloride: 112 mEq/L (ref 96–112)
Creatinine, Ser: 1.77 mg/dL — ABNORMAL HIGH (ref 0.50–1.35)
GFR calc Af Amer: 50 mL/min — ABNORMAL LOW (ref >90)
GFR calc non Af Amer: 43 mL/min — ABNORMAL LOW (ref >90)
Glucose, Bld: 111 mg/dL — ABNORMAL HIGH (ref 70–99)
Potassium: 3.4 mEq/L — ABNORMAL LOW (ref 3.5–5.1)
Sodium: 142 mEq/L (ref 135–145)

## 2012-12-20 MED ORDER — POTASSIUM CHLORIDE CRYS ER 20 MEQ PO TBCR
40.0000 meq | EXTENDED_RELEASE_TABLET | Freq: Once | ORAL | Status: AC
  Administered 2012-12-20: 40 meq via ORAL
  Filled 2012-12-20: qty 2

## 2012-12-20 MED ORDER — HYDRALAZINE HCL 10 MG PO TABS: 10.0000 mg | ORAL_TABLET | Freq: Three times a day (TID) | ORAL | Status: DC

## 2012-12-20 MED ORDER — POTASSIUM CHLORIDE CRYS ER 20 MEQ PO TBCR
40.0000 meq | EXTENDED_RELEASE_TABLET | Freq: Every day | ORAL | Status: DC
  Administered 2012-12-20: 40 meq via ORAL
  Filled 2012-12-20: qty 2

## 2012-12-20 NOTE — Progress Notes (Signed)
Subjective: He says he feels well and wants to go home. He has no complaints  Objective: Vital signs in last 24 hours: Temp:  [98 F (36.7 C)-98.8 F (37.1 C)] 98.6 F (37 C) (09/18 0549) Pulse Rate:  [60-66] 66 (09/18 0549) Resp:  [18-20] 20 (09/18 0549) BP: (120-154)/(48-67) 140/64 mmHg (09/18 0549) SpO2:  [100 %] 100 % (09/18 0549) Weight change:  Last BM Date: 12/18/12  Intake/Output from previous day: 09/17 0701 - 09/18 0700 In: 3310 [P.O.:1560; I.V.:1750] Out: 3150 [Urine:3150]  PHYSICAL EXAM General appearance: alert, cooperative and no distress Resp: clear to auscultation bilaterally Cardio: His heart is regular and he has a 1-2/6 systolic murmur GI: soft, non-tender; bowel sounds normal; no masses,  no organomegaly Extremities: extremities normal, atraumatic, no cyanosis or edema  Lab Results:    Basic Metabolic Panel:  Recent Labs  43/96/21 0448 12/20/12 0448  NA 143 142  K 3.9 3.4*  CL 115* 112  CO2 20 22  GLUCOSE 85 111*  BUN 42* 26*  CREATININE 2.33* 1.77*  CALCIUM 9.5 9.2   Liver Function Tests:  Recent Labs  12/17/12 1418  AST 8  ALT 6  ALKPHOS 80  BILITOT 0.3  PROT 6.8  ALBUMIN 3.2*   No results found for this basename: LIPASE, AMYLASE,  in the last 72 hours No results found for this basename: AMMONIA,  in the last 72 hours CBC:  Recent Labs  12/17/12 1418 12/18/12 0241 12/19/12 0448  WBC 12.7* 8.3 7.0  NEUTROABS 9.0*  --  4.3  HGB 12.9* 11.1* 11.0*  HCT 38.4* 32.7* 32.7*  MCV 81.9 82.0 82.6  PLT 264 196 207   Cardiac Enzymes:  Recent Labs  12/17/12 2111 12/17/12 2112 12/18/12 0241 12/18/12 0941  CKTOTAL  --  30  --   --   TROPONINI <0.30  --  <0.30 <0.30   BNP: No results found for this basename: PROBNP,  in the last 72 hours D-Dimer: No results found for this basename: DDIMER,  in the last 72 hours CBG:  Recent Labs  12/18/12 2134 12/19/12 0742 12/19/12 1132 12/19/12 1639 12/19/12 2050 12/20/12 0724   GLUCAP 109* 79 84 83 115* 79   Hemoglobin A1C: No results found for this basename: HGBA1C,  in the last 72 hours Fasting Lipid Panel: No results found for this basename: CHOL, HDL, LDLCALC, TRIG, CHOLHDL, LDLDIRECT,  in the last 72 hours Thyroid Function Tests: No results found for this basename: TSH, T4TOTAL, FREET4, T3FREE, THYROIDAB,  in the last 72 hours Anemia Panel: No results found for this basename: VITAMINB12, FOLATE, FERRITIN, TIBC, IRON, RETICCTPCT,  in the last 72 hours Coagulation: No results found for this basename: LABPROT, INR,  in the last 72 hours Urine Drug Screen: Drugs of Abuse     Component Value Date/Time   LABOPIA NONE DETECTED 01/29/2011 2136   COCAINSCRNUR NONE DETECTED 01/29/2011 2136   LABBENZ NONE DETECTED 01/29/2011 2136   AMPHETMU NONE DETECTED 01/29/2011 2136   THCU NONE DETECTED 01/29/2011 2136   LABBARB NONE DETECTED 01/29/2011 2136    Alcohol Level: No results found for this basename: ETH,  in the last 72 hours Urinalysis:  Recent Labs  12/17/12 1644  COLORURINE YELLOW  LABSPEC >1.030*  PHURINE 5.5  GLUCOSEU NEGATIVE  HGBUR NEGATIVE  BILIRUBINUR SMALL*  KETONESUR TRACE*  PROTEINUR NEGATIVE  UROBILINOGEN 0.2  NITRITE NEGATIVE  LEUKOCYTESUR NEGATIVE   Misc. Labs:  ABGS No results found for this basename: PHART, PCO2, PO2ART, TCO2, HCO3,  in the last 72 hours CULTURES Recent Results (from the past 240 hour(s))  CLOSTRIDIUM DIFFICILE BY PCR     Status: None   Collection Time    12/18/12 12:30 PM      Result Value Range Status   C difficile by pcr NEGATIVE  NEGATIVE Final   Studies/Results: US Renal  12/18/2012   *RADIOLOGY REPORT*  Clinical Data: History of acute renal failure.  RENAL/URINARY TRACT ULTRASOUND COMPLETE  Comparison:  CT 12/23/2006.  CT 12/24/2006.  Findings:  Right Kidney: Right renal length is 10.1 cm.  Left Kidney:  Left renal length is 11.0 cm.  There is slight increased echogenicity of the renal parenchyma  bilaterally.  This may be associated with medical renal disease.  Examination of each kidney shows no evidence of hydronephrosis, solid or cystic mass, calculus, or parenchymal loss.  Bladder:  No urinary bladder abnormality was identified.  No ureteral jets were visualized.  IMPRESSION: Kidneys are normal size with no evidence of hydronephrosis, mass, or significant parenchymal loss.  There is slight increased echogenicity of the renal parenchyma bilaterally which may be associated with medical renal disease.   Original Report Authenticated By: Shanon Brow Call    Medications:  Prior to Admission:  Prescriptions prior to admission  Medication Sig Dispense Refill  . amLODipine (NORVASC) 10 MG tablet Take 10 mg by mouth daily.        Marland Kitchen aspirin EC 81 MG tablet Take 81 mg by mouth daily.      Marland Kitchen buPROPion (WELLBUTRIN SR) 150 MG 12 hr tablet Take 150 mg by mouth 2 (two) times daily.      Marland Kitchen CATAPRES-TTS-3 0.3 MG/24HR Place 1 patch (0.3 mg total) onto the skin once a week.  4 patch  12  . diclofenac sodium (VOLTAREN) 1 % GEL Apply 1 application topically 4 (four) times daily.        . fexofenadine-pseudoephedrine (ALLEGRA-D) 60-120 MG per tablet Take 1 tablet by mouth 2 (two) times daily as needed.      . fish oil-omega-3 fatty acids 1000 MG capsule Take 1 g by mouth 4 (four) times daily.        . haloperidol decanoate (HALDOL DECANOATE) 100 MG/ML injection Inject 100 mg into the muscle every 14 (fourteen) days.        . insulin glargine (LANTUS) 100 UNIT/ML injection Inject 20 Units into the skin 2 (two) times daily.        Marland Kitchen lisinopril (PRINIVIL,ZESTRIL) 40 MG tablet Take 40 mg by mouth daily.        Marland Kitchen lithium carbonate 300 MG capsule Take 900 mg by mouth at bedtime.       . metoprolol tartrate (LOPRESSOR) 25 MG tablet Take 25 mg by mouth 2 (two) times daily.      . Multiple Vitamin (DAILY VITE PO) Take 1 tablet by mouth daily.      . naproxen sodium (ALEVE) 220 MG tablet Take 220 mg by mouth 2 (two) times  daily with a meal.      . potassium chloride SA (K-DUR,KLOR-CON) 20 MEQ tablet Take 20 mEq by mouth daily.        . pravastatin (PRAVACHOL) 40 MG tablet Take 40 mg by mouth daily.        . temazepam (RESTORIL) 15 MG capsule Take 15 mg by mouth at bedtime.        Marland Kitchen buPROPion (ZYBAN) 150 MG 12 hr tablet Take 1 tablet (150 mg total) by mouth 2 (two)  times daily.  60 tablet  12   Scheduled: . aspirin EC  81 mg Oral Daily  . buPROPion  150 mg Oral BID  . ciprofloxacin  500 mg Oral QHS  . enoxaparin (LOVENOX) injection  30 mg Subcutaneous Q24H  . insulin aspart  0-5 Units Subcutaneous QHS  . insulin aspart  0-9 Units Subcutaneous TID WC  . metroNIDAZOLE  500 mg Oral Q8H  . pantoprazole  40 mg Oral Daily  . potassium chloride  40 mEq Oral Once  . simvastatin  20 mg Oral q1800  . temazepam  15 mg Oral QHS   Continuous: . sodium chloride 0.45 % 1,000 mL with sodium bicarbonate 75 mEq infusion 75 mL/hr at 12/20/12 7017   BLT:[id number], acetaminophen, ondansetron (ZOFRAN) IV, ondansetron  Assesment: He was admitted with acute renal failure. I think he was dehydrated. He has improved. I think she's ready for discharge. His potassium level was low so I'm going to give him a single dose potassium Principal Problem:   Acute renal failure Active Problems:   Hypertension   Schizophrenia   Diabetes mellitus, type 2   Chronic kidney disease, stage 2, mildly decreased GFR   Dehydration   Diarrhea   Gastroenteritis   GERD (gastroesophageal reflux disease)   Metabolic acidosis    Plan: He will be discharged home today.    LOS: 3 days   Francis Yardley L 12/20/2012, 8:27 AM

## 2012-12-20 NOTE — Clinical Social Work Note (Signed)
Pt d/c today back to [facility]. Pt and facility aware and agreeable. Facility will pick up pt this afternoon. D/C summary and FL2 faxed. [facility] notified of home health RN need.   Benay Pike, [place]

## 2012-12-20 NOTE — Progress Notes (Signed)
Subjective: Interval History: has no complaint of  nausea or vomiting. Patient also denies any diarrhea. Presently he doesn't have any difficulty breathing  Objective: Vital signs in last 24 hours: Temp:  [98 F (36.7 C)-98.8 F (37.1 C)] 98.6 F (37 C) (09/18 0549) Pulse Rate:  [60-66] 66 (09/18 0549) Resp:  [18-20] 20 (09/18 0549) BP: (120-154)/(48-67) 140/64 mmHg (09/18 0549) SpO2:  [100 %] 100 % (09/18 0549) Weight change:   Intake/Output from previous day: 09/17 0701 - 09/18 0700 In: 3310 [P.O.:1560; I.V.:1750] Out: 3150 [Urine:3150] Intake/Output this shift:    General appearance: alert, cooperative and no distress Resp: clear to auscultation bilaterally Cardio: regular rate and rhythm, S1, S2 normal, no murmur, click, rub or gallop GI: soft, non-tender; bowel sounds normal; no masses,  no organomegaly Extremities: extremities normal, atraumatic, no cyanosis or edema  Lab Results:  Recent Labs  12/18/12 0241 12/19/12 0448  WBC 8.3 7.0  HGB 11.1* 11.0*  HCT 32.7* 32.7*  PLT 196 207   BMET:  Recent Labs  12/19/12 0448 12/20/12 0448  NA 143 142  K 3.9 3.4*  CL 115* 112  CO2 20 22  GLUCOSE 85 111*  BUN 42* 26*  CREATININE 2.33* 1.77*  CALCIUM 9.5 9.2   No results found for this basename: PTH,  in the last 72 hours Iron Studies: No results found for this basename: IRON, TIBC, TRANSFERRIN, FERRITIN,  in the last 72 hours  Studies/Results: US Renal  12/18/2012   *RADIOLOGY REPORT*  Clinical Data: History of acute renal failure.  RENAL/URINARY TRACT ULTRASOUND COMPLETE  Comparison:  CT 12/23/2006.  CT 12/24/2006.  Findings:  Right Kidney: Right renal length is 10.1 cm.  Left Kidney:  Left renal length is 11.0 cm.  There is slight increased echogenicity of the renal parenchyma bilaterally.  This may be associated with medical renal disease.  Examination of each kidney shows no evidence of hydronephrosis, solid or cystic mass, calculus, or parenchymal loss.   Bladder:  No urinary bladder abnormality was identified.  No ureteral jets were visualized.  IMPRESSION: Kidneys are normal size with no evidence of hydronephrosis, mass, or significant parenchymal loss.  There is slight increased echogenicity of the renal parenchyma bilaterally which may be associated with medical renal disease.   Original Report Authenticated By: [name] Call    I have reviewed the patient's current medications.  Assessment/Plan: Problem #1 acute kidney injury. Etiology multifactorial including prerenal syndrome/ ACE inhibitor/and NSAID presently patient is none oliguric BUN and creatinine is improving. Problem #2 hypertension his blood pressure has improved Problem #3 history of diabetes Problem #4 gastroenteritis presently has improved. Problem #5 anemia his hemoglobin and hematocrit is low. Possibly secondary to iron deficiency anemia. Problem #6  history of schizophrenia Problem #7 heart insufficiency Problem #8 coronary artery disease,  Problem #9 hypokalemia Plan: We'll continue his hydration We'll start patient on KCl 40 mEq by mouth once a day. We'll check iron studies in the morning We'll check his basic metabolic panel and phosphorus in the morning.   LOS: 3 days   [name] S 12/20/2012,8:58 AM

## 2012-12-20 NOTE — Care Management Note (Signed)
    Page 1 of 1   12/20/2012     11:38:29 AM   CARE MANAGEMENT NOTE 12/20/2012  Patient:  [name], [name]   Account Number:  [id number]  Date Initiated:  12/20/2012  Documentation initiated by:  Theophilus Kinds  Subjective/Objective Assessment:   Pt admitted from [facility] with acute renal failure. Pt will return at discharge.     Action/Plan:   Pt dicharged today. CSW to arrange discharge to facility. [facility] arranged for RN. Romualdo Bolk of [facility] is aware and [facility] services will start within 48 hours of discharge.   Anticipated DC Date:  12/20/2012   Anticipated DC Plan:  ASSISTED LIVING / REST HOME  In-house referral  Clinical Social Worker      DC Forensic scientist  CM consult      [facility] Choice  HOME HEALTH   Choice offered to / List presented to:  C-1 Patient        [facility] [place] [facility] arranged  HH-1 RN      [place].   Status of service:  Completed, signed off Medicare Important Message given?   (If response is "NO", the following Medicare IM given date fields will be blank) Date Medicare IM given:   Date Additional Medicare IM given:    Discharge Disposition:  ASSISTED LIVING  Per UR Regulation:    If discussed at Long Length of Stay Meetings, dates discussed:    Comments:  12/20/12 [place], RN BSN CM

## 2012-12-20 NOTE — Discharge Summary (Signed)
Physician Discharge Summary  Patient ID: [name] MRN: [id number] DOB/AGE: 1966-03-11 47 y.o. Primary Care Physician:Jairo Bellew L, MD Admit date: 12/17/2012 Discharge date: 12/20/2012    Discharge Diagnoses:   Principal Problem:   Acute renal failure Active Problems:   Hypertension   Schizophrenia   Diabetes mellitus, type 2   Chronic kidney disease, stage 2, mildly decreased GFR   Dehydration   Diarrhea   Gastroenteritis   GERD (gastroesophageal reflux disease)   Metabolic acidosis  hypokalemia    Medication List    STOP taking these medications       ALEVE 220 MG tablet  Generic drug:  naproxen sodium     buPROPion 150 MG 12 hr tablet  Commonly known as:  ZYBAN     lisinopril 40 MG tablet  Commonly known as:  PRINIVIL,ZESTRIL      TAKE these medications       amLODipine 10 MG tablet  Commonly known as:  NORVASC  Take 10 mg by mouth daily.     aspirin EC 81 MG tablet  Take 81 mg by mouth daily.     buPROPion 150 MG 12 hr tablet  Commonly known as:  WELLBUTRIN SR  Take 150 mg by mouth 2 (two) times daily.     CATAPRES-TTS-3 0.3 mg/24hr patch  Generic drug:  cloNIDine  Place 1 patch (0.3 mg total) onto the skin once a week.     DAILY VITE PO  Take 1 tablet by mouth daily.     fexofenadine-pseudoephedrine 60-120 MG per tablet  Commonly known as:  ALLEGRA-D  Take 1 tablet by mouth 2 (two) times daily as needed.     fish oil-omega-3 fatty acids 1000 MG capsule  Take 1 g by mouth 4 (four) times daily.     haloperidol decanoate 100 MG/ML injection  Commonly known as:  HALDOL DECANOATE  Inject 100 mg into the muscle every 14 (fourteen) days.     hydrALAZINE 10 MG tablet  Commonly known as:  APRESOLINE  Take 1 tablet (10 mg total) by mouth 3 (three) times daily.     insulin glargine 100 UNIT/ML injection  Commonly known as:  LANTUS  Inject 20 Units into the skin 2 (two) times daily.     lithium carbonate 300 MG capsule  Take 900 mg  by mouth at bedtime.     metoprolol tartrate 25 MG tablet  Commonly known as:  LOPRESSOR  Take 25 mg by mouth 2 (two) times daily.     potassium chloride SA 20 MEQ tablet  Commonly known as:  K-DUR,KLOR-CON  Take 20 mEq by mouth daily.     pravastatin 40 MG tablet  Commonly known as:  PRAVACHOL  Take 40 mg by mouth daily.     temazepam 15 MG capsule  Commonly known as:  RESTORIL  Take 15 mg by mouth at bedtime.     VOLTAREN 1 % Gel  Generic drug:  diclofenac sodium  Apply 1 application topically 4 (four) times daily.        Discharged Condition: Improved    Consults: Nephrology  Significant Diagnostic Studies: US Renal  12/18/2012   *RADIOLOGY REPORT*  Clinical Data: History of acute renal failure.  RENAL/URINARY TRACT ULTRASOUND COMPLETE  Comparison:  CT 12/23/2006.  CT 12/24/2006.  Findings:  Right Kidney: Right renal length is 10.1 cm.  Left Kidney:  Left renal length is 11.0 cm.  There is slight increased echogenicity of the renal parenchyma bilaterally.  This may  be associated with medical renal disease.  Examination of each kidney shows no evidence of hydronephrosis, solid or cystic mass, calculus, or parenchymal loss.  Bladder:  No urinary bladder abnormality was identified.  No ureteral jets were visualized.  IMPRESSION: Kidneys are normal size with no evidence of hydronephrosis, mass, or significant parenchymal loss.  There is slight increased echogenicity of the renal parenchyma bilaterally which may be associated with medical renal disease.   Original Report Authenticated By: Shanon Brow Call    Lab Results: Basic Metabolic Panel:  Recent Labs  12/19/12 0448 12/20/12 0448  NA 143 142  K 3.9 3.4*  CL 115* 112  CO2 20 22  GLUCOSE 85 111*  BUN 42* 26*  CREATININE 2.33* 1.77*  CALCIUM 9.5 9.2   Liver Function Tests:  Recent Labs  12/17/12 1418  AST 8  ALT 6  ALKPHOS 80  BILITOT 0.3  PROT 6.8  ALBUMIN 3.2*     CBC:  Recent Labs  12/17/12 1418  12/18/12 0241 12/19/12 0448  WBC 12.7* 8.3 7.0  NEUTROABS 9.0*  --  4.3  HGB 12.9* 11.1* 11.0*  HCT 38.4* 32.7* 32.7*  MCV 81.9 82.0 82.6  PLT 264 196 207    Recent Results (from the past 240 hour(s))  CLOSTRIDIUM DIFFICILE BY PCR     Status: None   Collection Time    12/18/12 12:30 PM      Result Value Range Status   C difficile by pcr NEGATIVE  NEGATIVE Final     Hospital Course: He was admitted after having diarrhea for about 10 days. When he came to the emergency department he was noted to be dehydrated and in acute on chronic renal failure. He was treated with IV fluids given Cipro and Flagyl and improved markedly. His diarrhea stopped. His renal function improved. Lisinopril was discontinued. By the time of discharge his creatinine was below 2 his potassium is 3.4 he had no diarrhea and he felt much better.  Discharge Exam: Blood pressure 140/64, pulse 66, temperature 98.6 F (37 C), temperature source Oral, resp. rate 20, height $RemoveBe'5\' 9"'kbNkxDKej$  (1.753 m), weight 88.5 kg (195 lb 1.7 oz), SpO2 100.00%. He is awake and alert. His chest is clear. Abdomen is soft.  Disposition: Discharge back to his assisted living facility. He will need home health services for a short period of time      Discharge Orders   Future Orders Complete By Expires   Discharge patient  As directed    Face-to-face encounter (required for Medicare/Medicaid patients)  As directed    Comments:     I Nashae Maudlin L certify that this patient is under my care and that I, or a nurse practitioner or physician's assistant working with me, had a face-to-face encounter that meets the physician face-to-face encounter requirements with this patient on 12/20/2012. The encounter with the patient was in whole, or in part for the following medical condition(s) which is the primary reason for home health care (List medical condition): Acute renal failure   Questions:     The encounter with the patient was in whole, or in part,  for the following medical condition, which is the primary reason for home health care:  Acute renal failure   I certify that, based on my findings, the following services are medically necessary home health services:  Nursing   My clinical findings support the need for the above services:  Cognitive impairments, dementia, or mental confusion  that make it unsafe to leave  home   Further, I certify that my clinical findings support that this patient is homebound due to:  Unable to leave home safely without assistance   Reason for Medically Necessary Home Health Services:  Skilled Nursing- Change/Decline in Patient [place]  As directed    Scheduling Instructions:     Please do basic metabolic profile on 34/62/1949   Questions:     To provide the following care/treatments:  RN        Signed: Katye Valek Carlean Jews Pager [phone]  12/20/2012, 8:51 AM

## 2013-04-08 ENCOUNTER — Other Ambulatory Visit: Payer: Self-pay | Admitting: Cardiology

## 2013-04-12 ENCOUNTER — Other Ambulatory Visit: Payer: Self-pay | Admitting: Cardiology

## 2013-05-22 ENCOUNTER — Ambulatory Visit: Payer: Medicaid Other | Admitting: Cardiovascular Disease

## 2013-05-23 ENCOUNTER — Ambulatory Visit (INDEPENDENT_AMBULATORY_CARE_PROVIDER_SITE_OTHER): Payer: Medicaid Other | Admitting: Cardiovascular Disease

## 2013-05-23 ENCOUNTER — Encounter: Payer: Self-pay | Admitting: Cardiovascular Disease

## 2013-05-23 VITALS — BP 162/55 | HR 50 | Ht 69.5 in | Wt 203.0 lb

## 2013-05-23 DIAGNOSIS — E7800 Pure hypercholesterolemia, unspecified: Secondary | ICD-10-CM

## 2013-05-23 DIAGNOSIS — I7090 Unspecified atherosclerosis: Secondary | ICD-10-CM

## 2013-05-23 DIAGNOSIS — I10 Essential (primary) hypertension: Secondary | ICD-10-CM

## 2013-05-23 DIAGNOSIS — I351 Nonrheumatic aortic (valve) insufficiency: Secondary | ICD-10-CM

## 2013-05-23 DIAGNOSIS — I359 Nonrheumatic aortic valve disorder, unspecified: Secondary | ICD-10-CM

## 2013-05-23 DIAGNOSIS — I2510 Atherosclerotic heart disease of native coronary artery without angina pectoris: Secondary | ICD-10-CM

## 2013-05-23 MED ORDER — HYDRALAZINE HCL 25 MG PO TABS: 25.0000 mg | ORAL_TABLET | Freq: Three times a day (TID) | ORAL | Status: DC

## 2013-05-23 MED ORDER — METOPROLOL TARTRATE 25 MG PO TABS: 12.5000 mg | ORAL_TABLET | Freq: Two times a day (BID) | ORAL | Status: DC

## 2013-05-23 MED ORDER — METOPROLOL SUCCINATE 12.5 MG HALF TABLET: 12.5000 mg | ORAL_TABLET | Freq: Every day | ORAL | Status: DC

## 2013-05-23 NOTE — Progress Notes (Signed)
Patient ID: [name], male   DOB: 24-Aug-1965, 48 y.o.   MRN: [id number]      SUBJECTIVE: The patient is a 48 year old male who I'm evaluating for the first time. He has a history of coronary artery disease having sustained a non-STEMI with a bare-metal stent to the LAD in April 2010. He also has hypertension, hyperlipidemia, diabetes mellitus, and a history of tobacco abuse. His most recent echocardiogram was performed in February 2014 which showed normal left ventricular systolic function, EF 93-90%, moderate LVH, grade 2 diastolic dysfunction, and mild central aortic regurgitation. He said he walks approximately 2-3 miles a day and experiences no chest pain or shortness of breath. He very seldom has dizziness. He denies palpitations and leg swelling.    No Known Allergies  Current Outpatient Prescriptions  Medication Sig Dispense Refill  . amLODipine (NORVASC) 10 MG tablet Take 10 mg by mouth daily.        Marland Kitchen aspirin EC 81 MG tablet Take 81 mg by mouth daily.      Marland Kitchen buPROPion (WELLBUTRIN SR) 150 MG 12 hr tablet Take 150 mg by mouth 2 (two) times daily.      Marland Kitchen CATAPRES-TTS-3 0.3 MG/24HR Place 1 patch (0.3 mg total) onto the skin once a week.  4 patch  12  . diclofenac sodium (VOLTAREN) 1 % GEL Apply 1 application topically 4 (four) times daily.        . fexofenadine-pseudoephedrine (ALLEGRA-D) 60-120 MG per tablet Take 1 tablet by mouth 2 (two) times daily as needed.      . fish oil-omega-3 fatty acids 1000 MG capsule Take 1 g by mouth 4 (four) times daily.        . haloperidol decanoate (HALDOL DECANOATE) 100 MG/ML injection Inject 100 mg into the muscle every 14 (fourteen) days.        . hydrALAZINE (APRESOLINE) 10 MG tablet Take 1 tablet (10 mg total) by mouth 3 (three) times daily.  90 tablet  12  . insulin glargine (LANTUS) 100 UNIT/ML injection Inject 20 Units into the skin 2 (two) times daily.        Marland Kitchen lithium carbonate 300 MG capsule Take 900 mg by mouth at bedtime.       .  metoprolol tartrate (LOPRESSOR) 25 MG tablet TAKE 1 TABLET BY MOUTH TWICE DAILY.  60 tablet  6  . Multiple Vitamin (DAILY VITE PO) Take 1 tablet by mouth daily.      . potassium chloride SA (K-DUR,KLOR-CON) 20 MEQ tablet Take 20 mEq by mouth daily.        . pravastatin (PRAVACHOL) 40 MG tablet Take 40 mg by mouth daily.        . temazepam (RESTORIL) 15 MG capsule Take 15 mg by mouth at bedtime.         No current facility-administered medications for this visit.    Past Medical History  Diagnosis Date  . Arteriosclerotic cardiovascular disease (ASCVD)     EF 60% -- Non-Q MI in 07/2008->  BMS to mid LAD  . Hypertension   . Hyperlipidemia   . Schizophrenia     Schizophrenia/bipolar disease/mental retardation  . Mild anemia     Result.  Nl H&H in 02/2011  . Diabetes mellitus, type 2     A1c of 5.6 in 05/2011  . Axillary abscess 2011    right  . Obesity   . Tobacco abuse     15 pack years; 0.5 pack per day  .  Aortic insufficiency     mild to moderate   . Cerebrovascular disease     CVA  . Chronic obstructive pulmonary disease   . Sinus bradycardia     On low dose beta blocker  . Diabetes mellitus type II     Past Surgical History  Procedure Laterality Date  . Hernia repair  1967    History   Social History  . Marital Status: Single    Spouse Name: N/A    Number of Children: N/A  . Years of Education: N/A   Occupational History  . Disabled    Social History Main Topics  . Smoking status: Current Some Day Smoker -- 0.50 packs/day for 30 years    Types: Cigarettes  . Smokeless tobacco: Never Used     Comment: Patient is not motivated to quit.  He attributes his smoking to the lack of other activities.  . Alcohol Use: No  . Drug Use: No  . Sexual Activity: Not on file   Other Topics Concern  . Not on file   Social History Narrative   Resides in an [place]:   05/23/13 1024  BP: 162/55  Pulse: 50  Height: 5' 9.5" (1.765 m)   Weight: 203 lb (92.08 kg)    PHYSICAL EXAM General: NAD Neck: No JVD, no thyromegaly or thyroid nodule.  Lungs: Clear to auscultation bilaterally with normal respiratory effort. CV: Nondisplaced PMI.  Heart regular S1/S2, no S3/S4, no murmur.  No peripheral edema.  No carotid bruit.  Normal pedal pulses.  Abdomen: Soft, nontender, no hepatosplenomegaly, no distention.  Neurologic: Alert and oriented x 3.  Psych: Normal affect. Extremities: No clubbing or cyanosis.   ECG: reviewed and available in electronic records.      ASSESSMENT AND PLAN: 1. CAD: He appears to be symptomatically stable, and I will continue ASA and pravastatin. Will reduce metoprolol to 12.5 mg bid given resting HR of 50 bpm. Normal LV systolic function. 2. HTN: It is presently elevated, and he has known grade 2 diastolic dysfunction. I will increase hydralazine to 25 mg 3 times daily. He is already taking amlodipine 10 mg daily. He does have chronic kidney disease. 3. Hyperlipidemia: Will check a fasting lipid panel. For the time being, continue pravastatin 40 mg daily. 4. Aortic regurgitation: mild as assessed in 05/2012. Continue to monitor. No diastolic murmur appreciated.  Dispo: f/u 6 months.  Kate Sable, M.D., F.A.C.C.

## 2013-05-23 NOTE — Patient Instructions (Addendum)
Your physician recommends that you schedule a follow-up appointment in: 6 months   Your physician has recommended you make the following change in your medication:   Decrease Metoprolol to 12.5 mg twice a day  Increase  Hydralazine to 25 mg three times a day  Please get blood work done when fasting (Lipids)

## 2013-05-24 LAB — LIPID PANEL
Cholesterol: 118 mg/dL (ref 0–200)
HDL: 35 mg/dL — ABNORMAL LOW (ref >39)
LDL Cholesterol: 65 mg/dL (ref 0–99)
Total CHOL/HDL Ratio: 3.4 Ratio
Triglycerides: 89 mg/dL (ref <150)
VLDL: 18 mg/dL (ref 0–40)

## 2013-06-05 ENCOUNTER — Other Ambulatory Visit: Payer: Self-pay | Admitting: Cardiology

## 2013-06-11 ENCOUNTER — Emergency Department (HOSPITAL_COMMUNITY)
Admission: EM | Admit: 2013-06-11 | Discharge: 2013-06-11 | Disposition: A | Discharge status: Home / Self Care | Payer: Medicaid Other | Attending: Emergency Medicine | Admitting: Emergency Medicine

## 2013-06-11 ENCOUNTER — Emergency Department (HOSPITAL_COMMUNITY): Payer: Medicaid Other

## 2013-06-11 ENCOUNTER — Encounter (HOSPITAL_COMMUNITY): Payer: Self-pay | Admitting: Emergency Medicine

## 2013-06-11 DIAGNOSIS — M25579 Pain in unspecified ankle and joints of unspecified foot: Secondary | ICD-10-CM | POA: Insufficient documentation

## 2013-06-11 DIAGNOSIS — Z872 Personal history of diseases of the skin and subcutaneous tissue: Secondary | ICD-10-CM | POA: Insufficient documentation

## 2013-06-11 DIAGNOSIS — Z79899 Other long term (current) drug therapy: Secondary | ICD-10-CM | POA: Insufficient documentation

## 2013-06-11 DIAGNOSIS — F17200 Nicotine dependence, unspecified, uncomplicated: Secondary | ICD-10-CM | POA: Insufficient documentation

## 2013-06-11 DIAGNOSIS — E119 Type 2 diabetes mellitus without complications: Secondary | ICD-10-CM | POA: Insufficient documentation

## 2013-06-11 DIAGNOSIS — F209 Schizophrenia, unspecified: Secondary | ICD-10-CM | POA: Insufficient documentation

## 2013-06-11 DIAGNOSIS — E669 Obesity, unspecified: Secondary | ICD-10-CM | POA: Insufficient documentation

## 2013-06-11 DIAGNOSIS — J449 Chronic obstructive pulmonary disease, unspecified: Secondary | ICD-10-CM | POA: Insufficient documentation

## 2013-06-11 DIAGNOSIS — Z794 Long term (current) use of insulin: Secondary | ICD-10-CM | POA: Insufficient documentation

## 2013-06-11 DIAGNOSIS — I10 Essential (primary) hypertension: Secondary | ICD-10-CM | POA: Insufficient documentation

## 2013-06-11 DIAGNOSIS — Z7982 Long term (current) use of aspirin: Secondary | ICD-10-CM | POA: Insufficient documentation

## 2013-06-11 DIAGNOSIS — J4489 Other specified chronic obstructive pulmonary disease: Secondary | ICD-10-CM | POA: Insufficient documentation

## 2013-06-11 DIAGNOSIS — M25559 Pain in unspecified hip: Secondary | ICD-10-CM | POA: Insufficient documentation

## 2013-06-11 DIAGNOSIS — R1110 Vomiting, unspecified: Secondary | ICD-10-CM | POA: Insufficient documentation

## 2013-06-11 DIAGNOSIS — Z862 Personal history of diseases of the blood and blood-forming organs and certain disorders involving the immune mechanism: Secondary | ICD-10-CM | POA: Insufficient documentation

## 2013-06-11 DIAGNOSIS — Z791 Long term (current) use of non-steroidal anti-inflammatories (NSAID): Secondary | ICD-10-CM | POA: Insufficient documentation

## 2013-06-11 DIAGNOSIS — R197 Diarrhea, unspecified: Secondary | ICD-10-CM | POA: Insufficient documentation

## 2013-06-11 DIAGNOSIS — E785 Hyperlipidemia, unspecified: Secondary | ICD-10-CM | POA: Insufficient documentation

## 2013-06-11 LAB — COMPREHENSIVE METABOLIC PANEL
ALT: 8 U/L (ref 0–53)
AST: 14 U/L (ref 0–37)
Albumin: 3.7 g/dL (ref 3.5–5.2)
Alkaline Phosphatase: 91 U/L (ref 39–117)
BUN: 24 mg/dL — ABNORMAL HIGH (ref 6–23)
CO2: 24 mEq/L (ref 19–32)
Calcium: 10.1 mg/dL (ref 8.4–10.5)
Chloride: 104 mEq/L (ref 96–112)
Creatinine, Ser: 1.92 mg/dL — ABNORMAL HIGH (ref 0.50–1.35)
GFR calc Af Amer: 46 mL/min — ABNORMAL LOW (ref >90)
GFR calc non Af Amer: 39 mL/min — ABNORMAL LOW (ref >90)
Glucose, Bld: 87 mg/dL (ref 70–99)
Potassium: 3.9 mEq/L (ref 3.7–5.3)
Sodium: 141 mEq/L (ref 137–147)
Total Bilirubin: 0.7 mg/dL (ref 0.3–1.2)
Total Protein: 7.7 g/dL (ref 6.0–8.3)

## 2013-06-11 LAB — CBC WITH DIFFERENTIAL/PLATELET
Basophils Absolute: 0 10*3/uL (ref 0.0–0.1)
Basophils Relative: 0 % (ref 0–1)
Eosinophils Absolute: 0.2 10*3/uL (ref 0.0–0.7)
Eosinophils Relative: 2 % (ref 0–5)
HCT: 39.2 % (ref 39.0–52.0)
Hemoglobin: 13.2 g/dL (ref 13.0–17.0)
Lymphocytes Relative: 20 % (ref 12–46)
Lymphs Abs: 1.9 10*3/uL (ref 0.7–4.0)
MCH: 27.7 pg (ref 26.0–34.0)
MCHC: 33.7 g/dL (ref 30.0–36.0)
MCV: 82.4 fL (ref 78.0–100.0)
Monocytes Absolute: 0.9 10*3/uL (ref 0.1–1.0)
Monocytes Relative: 10 % (ref 3–12)
Neutro Abs: 6.3 10*3/uL (ref 1.7–7.7)
Neutrophils Relative %: 68 % (ref 43–77)
Platelets: 219 10*3/uL (ref 150–400)
RBC: 4.76 MIL/uL (ref 4.22–5.81)
RDW: 14.3 % (ref 11.5–15.5)
WBC: 9.2 10*3/uL (ref 4.0–10.5)

## 2013-06-11 MED ORDER — CIPROFLOXACIN HCL 500 MG PO TABS: 500.0000 mg | ORAL_TABLET | Freq: Two times a day (BID) | ORAL | Status: DC

## 2013-06-11 NOTE — ED Notes (Signed)
Pt is a resident of [place] who presents to er today for further evaluation of n/v/d, abd pain that started over the weekend, states that his room mate has been sick with same symptoms, also c/o left heel pain for a week and left hip pain that has been intermittent for several months, denies any injury.

## 2013-06-11 NOTE — ED Provider Notes (Signed)
CSN: [id number]     Arrival date & time 06/11/13  1037 History  This chart was scribed for Maudry Diego, MD by Roxan Diesel, ED scribe.  This patient was seen in room APA12/APA12 and the patient's care was started at 12:55 PM.   Chief Complaint  Patient presents with  . Diarrhea    Patient is a 48 y.o. male presenting with diarrhea. The history is provided by the patient. No language interpreter was used.  Diarrhea Severity:  Moderate Number of episodes:  1 Duration:  1 week Timing:  Intermittent Progression:  Worsening Relieved by:  Nothing Ineffective treatments:  None tried Associated symptoms: arthralgias and vomiting   Associated symptoms: no headaches   Risk factors: sick contacts (room mate has had similar symptoms)     HPI Comments: [name] is a 48 y.o. male who presents to the Emergency Department complaining of several days of vomiting and diarrhea.  Pt reports he has had diarrhea for the past week and vomited one time today.  He states he had one episode of diarrhea this morning.  Pt also complains of one week of left heel pain and several months of intermittent left hip pain.  He denies injury.   Past Medical History  Diagnosis Date  . Arteriosclerotic cardiovascular disease (ASCVD)     EF 60% -- Non-Q MI in 07/2008->  BMS to mid LAD  . Hypertension   . Hyperlipidemia   . Schizophrenia     Schizophrenia/bipolar disease/mental retardation  . Mild anemia     Result.  Nl H&H in 02/2011  . Diabetes mellitus, type 2     A1c of 5.6 in 05/2011  . Axillary abscess 2011    right  . Obesity   . Tobacco abuse     15 pack years; 0.5 pack per day  . Aortic insufficiency     mild to moderate   . Cerebrovascular disease     CVA  . Chronic obstructive pulmonary disease   . Sinus bradycardia     On low dose beta blocker  . Diabetes mellitus type II     Past Surgical History  Procedure Laterality Date  . Hernia repair  1967    Family History   Problem Relation Age of Onset  . Hypertension    . Arthritis      History  Substance Use Topics  . Smoking status: Current Some Day Smoker -- 0.50 packs/day for 30 years    Types: Cigarettes  . Smokeless tobacco: Never Used     Comment: Patient is not motivated to quit.  He attributes his smoking to the lack of other activities.  . Alcohol Use: No     Review of Systems  Constitutional: Negative for appetite change and fatigue.  HENT: Negative for congestion, ear discharge and sinus pressure.   Eyes: Negative for discharge.  Respiratory: Negative for cough.   Cardiovascular: Negative for chest pain.  Gastrointestinal: Positive for vomiting and diarrhea.  Genitourinary: Negative for frequency and hematuria.  Musculoskeletal: Positive for arthralgias. Negative for back pain.  Skin: Negative for rash.  Neurological: Negative for seizures and headaches.  Psychiatric/Behavioral: Negative for hallucinations.       Allergies  Review of patient's allergies indicates no known allergies.  Home Medications   Current Outpatient Rx  Name  Route  Sig  Dispense  Refill  . amLODipine (NORVASC) 10 MG tablet   Oral   Take 10 mg by mouth daily.           Marland Kitchen  aspirin EC 81 MG tablet   Oral   Take 81 mg by mouth daily.         Marland Kitchen buPROPion (WELLBUTRIN SR) 150 MG 12 hr tablet   Oral   Take 150 mg by mouth 2 (two) times daily.         . diclofenac sodium (VOLTAREN) 1 % GEL   Topical   Apply 1 application topically 4 (four) times daily.           . fish oil-omega-3 fatty acids 1000 MG capsule   Oral   Take 1 g by mouth 4 (four) times daily.           . haloperidol decanoate (HALDOL DECANOATE) 100 MG/ML injection   Intramuscular   Inject 100 mg into the muscle every 14 (fourteen) days.           . hydrALAZINE (APRESOLINE) 25 MG tablet   Oral   Take 1 tablet (25 mg total) by mouth 3 (three) times daily.   90 tablet   11   . insulin glargine (LANTUS) 100 UNIT/ML  injection   Subcutaneous   Inject 20 Units into the skin daily.          Marland Kitchen lithium carbonate 300 MG capsule   Oral   Take 600 mg by mouth at bedtime.          . metoprolol tartrate (LOPRESSOR) 25 MG tablet   Oral   Take 0.5 tablets (12.5 mg total) by mouth 2 (two) times daily.   30 tablet   6   . Multiple Vitamin (DAILY VITE PO)   Oral   Take 1 tablet by mouth daily.         . potassium chloride SA (K-DUR,KLOR-CON) 20 MEQ tablet   Oral   Take 20 mEq by mouth daily.           . pravastatin (PRAVACHOL) 40 MG tablet   Oral   Take 40 mg by mouth daily.           . temazepam (RESTORIL) 15 MG capsule   Oral   Take 15 mg by mouth at bedtime.           Marland Kitchen CATAPRES-TTS-3 0.3 MG/24HR   Transdermal   Place 1 patch (0.3 mg total) onto the skin once a week.   4 patch   12     Dispense as written.    DAW PER INSURANCE   . fexofenadine-pseudoephedrine (ALLEGRA-D) 60-120 MG per tablet   Oral   Take 1 tablet by mouth 2 (two) times daily as needed. congestion          BP 135/104  Pulse 77  Temp(Src) 98.4 F (36.9 C) (Oral)  Resp 20  Ht 5' 9.5" (1.765 m)  Wt 203 lb (92.08 kg)  BMI 29.56 kg/m2  SpO2 99%  Physical Exam  Constitutional: He is oriented to person, place, and time. He appears well-developed.  HENT:  Head: Normocephalic.  Mouth/Throat: Mucous membranes are dry.  Mild dry MMs  Eyes: Conjunctivae and EOM are normal. No scleral icterus.  Neck: Neck supple. No thyromegaly present.  Cardiovascular: Normal rate and regular rhythm.  Exam reveals no gallop and no friction rub.   No murmur heard. Pulmonary/Chest: No stridor. He has no wheezes. He has no rales. He exhibits no tenderness.  Abdominal: He exhibits no distension. There is no tenderness. There is no rebound.  Musculoskeletal: Normal range of motion. He exhibits tenderness. He exhibits no  edema.  Mild left hip tenderness  Lymphadenopathy:    He has no cervical adenopathy.  Neurological: He is  oriented to person, place, and time. He exhibits normal muscle tone. Coordination normal.  Skin: No rash noted. No erythema.  Psychiatric: He has a normal mood and affect. His behavior is normal.    ED Course  Procedures (including critical care time)  DIAGNOSTIC STUDIES: 99Oxygen Saturation is 99% on room air, normal by my interpretation.    COORDINATION OF CARE: 12:58 PM-Discussed treatment plan which includes labs with pt at bedside and pt agreed to plan.     Labs Review Labs Reviewed  COMPREHENSIVE METABOLIC PANEL - Abnormal; Notable for the following:    BUN 24 (*)    Creatinine, Ser 1.92 (*)    GFR calc non Af Amer 39 (*)    GFR calc Af Amer 46 (*)    All other components within normal limits  CBC WITH DIFFERENTIAL    Imaging Review Dg Hip Complete Left  06/11/2013   CLINICAL DATA:  Left hip pain for 6 months.  EXAM: LEFT HIP - COMPLETE 2+ VIEW  COMPARISON:  None.  FINDINGS: Severe left hip osteoarthritis with subchondral cysts and large marginal osteophytes. Moderate to severe right hip osteoarthritis with acetabular subchondral cysts. Large os acetabulum on the right. There is no fracture or acute osseous abnormality. The obturator rings appear intact.  IMPRESSION: Severe left-greater-than-right hip osteoarthritis. No acute osseous abnormality.   Electronically Signed   By: Dereck Ligas M.D.   On: 06/11/2013 11:28   Dg Foot Complete Left  06/11/2013   CLINICAL DATA:  Foot pain.  Heel pain.  EXAM: LEFT FOOT - COMPLETE 3+ VIEW  COMPARISON:  None.  FINDINGS: Anatomic alignment of the bones of the left foot. No fracture. Calcaneal spurs incidentally noted. Pes planus. Soft tissues appear within normal limits. No areas of ulceration or osteolysis.  IMPRESSION: No acute osseous abnormality.   Electronically Signed   By: Dereck Ligas M.D.   On: 06/11/2013 11:29     EKG Interpretation None      MDM   Final diagnoses:  None    The chart was scribed for me under my  direct supervision.  I personally performed the history, physical, and medical decision making and all procedures in the evaluation of this patient.Maudry Diego, MD 06/11/13 [phone]

## 2013-06-11 NOTE — Discharge Instructions (Signed)
Drink plenty of fluids.  Follow up with your md next week

## 2013-06-11 NOTE — ED Notes (Signed)
CALL FOR TRANSPORT AT [facility] [phone],

## 2013-12-02 ENCOUNTER — Encounter: Payer: Self-pay | Admitting: Cardiovascular Disease

## 2013-12-02 ENCOUNTER — Ambulatory Visit (INDEPENDENT_AMBULATORY_CARE_PROVIDER_SITE_OTHER): Payer: Medicaid Other | Admitting: Cardiovascular Disease

## 2013-12-02 VITALS — BP 118/88 | HR 53 | Ht 69.0 in | Wt 203.0 lb

## 2013-12-02 DIAGNOSIS — R001 Bradycardia, unspecified: Secondary | ICD-10-CM

## 2013-12-02 DIAGNOSIS — I10 Essential (primary) hypertension: Secondary | ICD-10-CM

## 2013-12-02 DIAGNOSIS — I7090 Unspecified atherosclerosis: Secondary | ICD-10-CM

## 2013-12-02 DIAGNOSIS — I351 Nonrheumatic aortic (valve) insufficiency: Secondary | ICD-10-CM

## 2013-12-02 DIAGNOSIS — I2510 Atherosclerotic heart disease of native coronary artery without angina pectoris: Secondary | ICD-10-CM

## 2013-12-02 DIAGNOSIS — E1129 Type 2 diabetes mellitus with other diabetic kidney complication: Secondary | ICD-10-CM

## 2013-12-02 DIAGNOSIS — E1121 Type 2 diabetes mellitus with diabetic nephropathy: Secondary | ICD-10-CM

## 2013-12-02 DIAGNOSIS — I359 Nonrheumatic aortic valve disorder, unspecified: Secondary | ICD-10-CM

## 2013-12-02 DIAGNOSIS — N183 Chronic kidney disease, stage 3 unspecified: Secondary | ICD-10-CM

## 2013-12-02 DIAGNOSIS — I498 Other specified cardiac arrhythmias: Secondary | ICD-10-CM

## 2013-12-02 DIAGNOSIS — N058 Unspecified nephritic syndrome with other morphologic changes: Secondary | ICD-10-CM

## 2013-12-02 DIAGNOSIS — E785 Hyperlipidemia, unspecified: Secondary | ICD-10-CM

## 2013-12-02 MED ORDER — METOPROLOL TARTRATE 25 MG PO TABS: 12.5000 mg | ORAL_TABLET | Freq: Every day | ORAL | Status: DC

## 2013-12-02 NOTE — Addendum Note (Signed)
Addended byAlvis Lemmings C on: 12/02/2013 02:15 PM   Modules accepted: Orders

## 2013-12-02 NOTE — Progress Notes (Signed)
Patient ID: [name], male   DOB: Nov 16, 1965, 48 y.o.   MRN: [id number]      SUBJECTIVE: The patient is a 48 year old male who has a history of coronary artery disease having sustained a non-STEMI with a bare-metal stent to the LAD in April 2010. He also has essential hypertension, hyperlipidemia, insulin-dependent diabetes mellitus, and a history of tobacco abuse. His most recent echocardiogram was performed in February 2014 which showed normal left ventricular systolic function, EF 34-74%, moderate LVH, grade 2 diastolic dysfunction, and mild central aortic regurgitation.  He walks approximately 3-4 miles a day and experiences no chest pain or shortness of breath.  He also denies dizziness, palpitations, orthopnea, PND and leg swelling.  ECG performed in the office today demonstrates sinus bradycardia, heart rate 56 beats per minute, with a diffuse nonspecific ST segment and T wave abnormality.    Review of Systems: As per "subjective", otherwise negative.  No Known Allergies  Current Outpatient Prescriptions  Medication Sig Dispense Refill  . amLODipine (NORVASC) 10 MG tablet Take 10 mg by mouth daily.        Marland Kitchen aspirin EC 81 MG tablet Take 81 mg by mouth daily.      Marland Kitchen buPROPion (WELLBUTRIN SR) 150 MG 12 hr tablet Take 150 mg by mouth 2 (two) times daily.      Marland Kitchen CATAPRES-TTS-3 0.3 MG/24HR Place 1 patch (0.3 mg total) onto the skin once a week.  4 patch  12  . ciprofloxacin (CIPRO) 500 MG tablet Take 1 tablet (500 mg total) by mouth 2 (two) times daily. One po bid x 7 days  14 tablet  0  . diclofenac sodium (VOLTAREN) 1 % GEL Apply 1 application topically 4 (four) times daily.        . fexofenadine-pseudoephedrine (ALLEGRA-D) 60-120 MG per tablet Take 1 tablet by mouth 2 (two) times daily as needed. congestion      . fish oil-omega-3 fatty acids 1000 MG capsule Take 1 g by mouth 4 (four) times daily.        . haloperidol decanoate (HALDOL DECANOATE) 100 MG/ML injection Inject 100  mg into the muscle every 14 (fourteen) days.        . hydrALAZINE (APRESOLINE) 25 MG tablet Take 1 tablet (25 mg total) by mouth 3 (three) times daily.  90 tablet  11  . insulin glargine (LANTUS) 100 UNIT/ML injection Inject 20 Units into the skin daily.       Marland Kitchen lithium carbonate 300 MG capsule Take 600 mg by mouth at bedtime.       . metoprolol tartrate (LOPRESSOR) 25 MG tablet Take 0.5 tablets (12.5 mg total) by mouth 2 (two) times daily.  30 tablet  6  . Multiple Vitamin (DAILY VITE PO) Take 1 tablet by mouth daily.      . potassium chloride SA (K-DUR,KLOR-CON) 20 MEQ tablet Take 20 mEq by mouth daily.        . pravastatin (PRAVACHOL) 40 MG tablet Take 40 mg by mouth daily.        . temazepam (RESTORIL) 15 MG capsule Take 15 mg by mouth at bedtime.         No current facility-administered medications for this visit.    Past Medical History  Diagnosis Date  . Arteriosclerotic cardiovascular disease (ASCVD)     EF 60% -- Non-Q MI in 07/2008->  BMS to mid LAD  . Hypertension   . Hyperlipidemia   . Schizophrenia     Schizophrenia/bipolar  disease/mental retardation  . Mild anemia     Result.  Nl H&H in 02/2011  . Diabetes mellitus, type 2     A1c of 5.6 in 05/2011  . Axillary abscess 2011    right  . Obesity   . Tobacco abuse     15 pack years; 0.5 pack per day  . Aortic insufficiency     mild to moderate   . Cerebrovascular disease     CVA  . Chronic obstructive pulmonary disease   . Sinus bradycardia     On low dose beta blocker  . Diabetes mellitus type II     Past Surgical History  Procedure Laterality Date  . Hernia repair  1967    History   Social History  . Marital Status: Single    Spouse Name: N/A    Number of Children: N/A  . Years of Education: N/A   Occupational History  . Disabled    Social History Main Topics  . Smoking status: Current Some Day Smoker -- 0.50 packs/day for 30 years    Types: Cigarettes  . Smokeless tobacco: Never Used     Comment:  Patient is not motivated to quit.  He attributes his smoking to the lack of other activities.  . Alcohol Use: No  . Drug Use: No  . Sexual Activity: Not on file   Other Topics Concern  . Not on file   Social History Narrative   Resides in an assisted-living facility    BP 118/88  Pulse 53    PHYSICAL EXAM General: NAD HEENT: Normal. Neck: No JVD, no thyromegaly. Lungs: Clear to auscultation bilaterally with normal respiratory effort. CV: Nondisplaced PMI.  Regular rate and rhythm, normal S1/S2, no Y6/T0, II/VI systolic murmur over RUSB. No pretibial or periankle edema.  No carotid bruit.   Abdomen: Soft, nontender, no hepatosplenomegaly, no distention.  Neurologic: Alert and oriented. Psych: Normal affect. Very pleasant. Skin: Normal. Musculoskeletal: Normal range of motion, no gross deformities. Extremities: No clubbing or cyanosis.   ECG: Most recent ECG reviewed.      ASSESSMENT AND PLAN: 1. CAD: Stable ischemic heart disease. I will continue ASA, pravastatin, and metoprolol at a reduced dose of 12.5 mg daily given resting bradycardia, albeit asymptomatic. Normal LV systolic function by most recent echo.  2. Essential HTN: It is well controlled on present therapy. 3. Hyperlipidemia: Lipid panel on 05/23/2013 demonstrated total cholesterol 118, triglycerides 89, HDL 35, LDL cigarettes. Continue pravastatin 40 mg daily.  4. Aortic regurgitation: Mild as assessed in 05/2012. Continue to monitor. No diastolic murmur appreciated.  5. CKD stage III-IV: On 06/11/2013, BUN 24 and creatinine 1.92. CBC was normal.  Dispo: f/u 1 year.   Kate Sable, M.D., F.A.C.C.

## 2013-12-02 NOTE — Patient Instructions (Signed)
Your physician wants you to follow-up in: 1 year You will receive a reminder letter in the mail two months in advance. If you don't receive a letter, please call our office to schedule the follow-up appointment.    Your physician has recommended you make the following change in your medication:      DECREASE Metoprolol to 12.5 mg daily       Thank you for choosing [facility] !

## 2014-05-19 ENCOUNTER — Other Ambulatory Visit: Payer: Self-pay | Admitting: Cardiovascular Disease

## 2014-07-09 ENCOUNTER — Other Ambulatory Visit: Payer: Self-pay | Admitting: Cardiovascular Disease

## 2014-08-21 ENCOUNTER — Other Ambulatory Visit: Payer: Self-pay | Admitting: Cardiology

## 2014-08-22 ENCOUNTER — Emergency Department (HOSPITAL_COMMUNITY)
Admission: EM | Admit: 2014-08-22 | Discharge: 2014-08-22 | Disposition: A | Discharge status: Home / Self Care | Payer: Medicaid Other | Attending: Emergency Medicine | Admitting: Emergency Medicine

## 2014-08-22 ENCOUNTER — Emergency Department (HOSPITAL_COMMUNITY): Payer: Medicaid Other

## 2014-08-22 ENCOUNTER — Encounter (HOSPITAL_COMMUNITY): Payer: Self-pay | Admitting: Emergency Medicine

## 2014-08-22 DIAGNOSIS — M79652 Pain in left thigh: Secondary | ICD-10-CM | POA: Insufficient documentation

## 2014-08-22 DIAGNOSIS — Z862 Personal history of diseases of the blood and blood-forming organs and certain disorders involving the immune mechanism: Secondary | ICD-10-CM | POA: Insufficient documentation

## 2014-08-22 DIAGNOSIS — Z872 Personal history of diseases of the skin and subcutaneous tissue: Secondary | ICD-10-CM | POA: Insufficient documentation

## 2014-08-22 DIAGNOSIS — F209 Schizophrenia, unspecified: Secondary | ICD-10-CM | POA: Diagnosis not present

## 2014-08-22 DIAGNOSIS — Z791 Long term (current) use of non-steroidal anti-inflammatories (NSAID): Secondary | ICD-10-CM | POA: Insufficient documentation

## 2014-08-22 DIAGNOSIS — Z7982 Long term (current) use of aspirin: Secondary | ICD-10-CM | POA: Insufficient documentation

## 2014-08-22 DIAGNOSIS — Z79899 Other long term (current) drug therapy: Secondary | ICD-10-CM | POA: Insufficient documentation

## 2014-08-22 DIAGNOSIS — Z720 Tobacco use: Secondary | ICD-10-CM | POA: Insufficient documentation

## 2014-08-22 DIAGNOSIS — R2242 Localized swelling, mass and lump, left lower limb: Secondary | ICD-10-CM | POA: Diagnosis not present

## 2014-08-22 DIAGNOSIS — N189 Chronic kidney disease, unspecified: Secondary | ICD-10-CM | POA: Insufficient documentation

## 2014-08-22 DIAGNOSIS — I2510 Atherosclerotic heart disease of native coronary artery without angina pectoris: Secondary | ICD-10-CM | POA: Insufficient documentation

## 2014-08-22 DIAGNOSIS — M25562 Pain in left knee: Secondary | ICD-10-CM | POA: Insufficient documentation

## 2014-08-22 DIAGNOSIS — Z8673 Personal history of transient ischemic attack (TIA), and cerebral infarction without residual deficits: Secondary | ICD-10-CM | POA: Insufficient documentation

## 2014-08-22 DIAGNOSIS — J449 Chronic obstructive pulmonary disease, unspecified: Secondary | ICD-10-CM | POA: Diagnosis not present

## 2014-08-22 DIAGNOSIS — Z794 Long term (current) use of insulin: Secondary | ICD-10-CM | POA: Diagnosis not present

## 2014-08-22 DIAGNOSIS — I129 Hypertensive chronic kidney disease with stage 1 through stage 4 chronic kidney disease, or unspecified chronic kidney disease: Secondary | ICD-10-CM | POA: Insufficient documentation

## 2014-08-22 DIAGNOSIS — E119 Type 2 diabetes mellitus without complications: Secondary | ICD-10-CM | POA: Insufficient documentation

## 2014-08-22 DIAGNOSIS — E785 Hyperlipidemia, unspecified: Secondary | ICD-10-CM | POA: Diagnosis not present

## 2014-08-22 DIAGNOSIS — E669 Obesity, unspecified: Secondary | ICD-10-CM | POA: Insufficient documentation

## 2014-08-22 LAB — CBG MONITORING, ED: Glucose-Capillary: 68 mg/dL (ref 65–99)

## 2014-08-22 MED ORDER — OXYCODONE-ACETAMINOPHEN 5-325 MG PO TABS: 1.0000 | ORAL_TABLET | Freq: Four times a day (QID) | ORAL | Status: DC | PRN

## 2014-08-22 MED ORDER — DEXAMETHASONE 4 MG PO TABS
10.0000 mg | ORAL_TABLET | Freq: Once | ORAL | Status: AC
  Administered 2014-08-22: 10 mg via ORAL
  Filled 2014-08-22: qty 3

## 2014-08-22 NOTE — ED Provider Notes (Signed)
CSN: [id number]     Arrival date & time 08/22/14  1049 History  This chart was scribed for Quintella Reichert, MD by Thea Alken, ED Scribe. This patient was seen in room APA01/APA01 and the patient's care was started at 11:17 AM.   Chief Complaint  Patient presents with  . Knee Pain   The history is provided by the patient. No language interpreter was used.   HPI Comments:  [name] is a 49 y.o. male with hx of schizophrenia and DM who present to the Emergency Department complaining of left knee. Pt usually has left thigh pain but states he woke up this morning with left knee pain and worsening left thigh pain. Pt states he slept with his knee bent last night but denies injury to knee. Pt denies fever and emesis. Pt lives in a group home.   Past Medical History  Diagnosis Date  . Arteriosclerotic cardiovascular disease (ASCVD)     EF 60% -- Non-Q MI in 07/2008->  BMS to mid LAD  . Hypertension   . Hyperlipidemia   . Schizophrenia     Schizophrenia/bipolar disease/mental retardation  . Mild anemia     Result.  Nl H&H in 02/2011  . Diabetes mellitus, type 2     A1c of 5.6 in 05/2011  . Axillary abscess 2011    right  . Obesity   . Tobacco abuse     15 pack years; 0.5 pack per day  . Aortic insufficiency     mild to moderate   . Cerebrovascular disease     CVA  . Chronic obstructive pulmonary disease   . Sinus bradycardia     On low dose beta blocker  . Diabetes mellitus type II    Past Surgical History  Procedure Laterality Date  . Hernia repair  1967   Family History  Problem Relation Age of Onset  . Hypertension    . Arthritis     History  Substance Use Topics  . Smoking status: Current Some Day Smoker -- 0.50 packs/day for 30 years    Types: Cigarettes    Start date: 12/03/1974  . Smokeless tobacco: Never Used     Comment: Patient is not motivated to quit.  He attributes his smoking to the lack of other activities.  . Alcohol Use: No    Review of Systems   Constitutional: Negative for fever.  Gastrointestinal: Negative for vomiting.  Musculoskeletal: Positive for myalgias and arthralgias.   Allergies  Review of patient's allergies indicates no known allergies.  Home Medications   Prior to Admission medications   Medication Sig Start Date End Date Taking? Authorizing Provider  amLODipine (NORVASC) 10 MG tablet Take 10 mg by mouth daily.      Historical Provider, MD  aspirin EC 81 MG tablet Take 81 mg by mouth daily.    Historical Provider, MD  CATAPRES-TTS-3 0.3 MG/24HR patch PLACE 1 PATCH ONTO SKIN ONCE A WEEK. REMOVE OLD PATCH. 08/21/14   Herminio Commons, MD  ciprofloxacin (CIPRO) 500 MG tablet Take 1 tablet (500 mg total) by mouth 2 (two) times daily. One po bid x 7 days 06/11/13   Milton Ferguson, MD  diclofenac sodium (VOLTAREN) 1 % GEL Apply 1 application topically 4 (four) times daily.      Historical Provider, MD  fexofenadine-pseudoephedrine (ALLEGRA-D) 60-120 MG per tablet Take 1 tablet by mouth 2 (two) times daily as needed. congestion    Historical Provider, MD  fish oil-omega-3 fatty acids  1000 MG capsule Take 1 g by mouth 4 (four) times daily.      Historical Provider, MD  haloperidol decanoate (HALDOL DECANOATE) 100 MG/ML injection Inject 100 mg into the muscle every 14 (fourteen) days.      Historical Provider, MD  hydrALAZINE (APRESOLINE) 25 MG tablet Take 1 tablet (25 mg total) by mouth 3 (three) times daily. 05/23/13   Herminio Commons, MD  hydrALAZINE (APRESOLINE) 25 MG tablet TAKE (1) TABLET BY MOUTH (3) TIMES DAILY. 05/20/14   Herminio Commons, MD  insulin glargine (LANTUS) 100 UNIT/ML injection Inject 20 Units into the skin daily.     Historical Provider, MD  lithium carbonate 300 MG capsule Take 600 mg by mouth at bedtime.     Historical Provider, MD  metoprolol tartrate (LOPRESSOR) 25 MG tablet TAKE (1/2) TABLET BY MOUTH DAILY. 07/09/14   Herminio Commons, MD  Multiple Vitamin (DAILY VITE PO) Take 1 tablet by  mouth daily.    Historical Provider, MD  potassium chloride SA (K-DUR,KLOR-CON) 20 MEQ tablet Take 20 mEq by mouth daily.      Historical Provider, MD  pravastatin (PRAVACHOL) 40 MG tablet Take 40 mg by mouth daily.      Historical Provider, MD  temazepam (RESTORIL) 15 MG capsule Take 15 mg by mouth at bedtime.      Historical Provider, MD   BP 165/68 mmHg  Pulse 63  Temp(Src) 98.6 F (37 C) (Oral)  Resp 18  Ht 5' 9.5" (1.765 m)  Wt 205 lb (92.987 kg)  BMI 29.85 kg/m2  SpO2 100% Physical Exam  Constitutional: He is oriented to person, place, and time. He appears well-developed and well-nourished.  HENT:  Head: Normocephalic and atraumatic.  Cardiovascular: Normal rate and regular rhythm.   No murmur heard. Pulmonary/Chest: Effort normal and breath sounds normal. No respiratory distress.  Abdominal: Soft. There is no tenderness. There is no rebound and no guarding.  Musculoskeletal: He exhibits no edema or tenderness.  2+ DP pulses in BLE.  Mild erythema and swelling to left knee with tenderness over the patella/suprapatellar region.  Slightly decreased ROM in left knee secondary to pain/swelling. No palpable effusion. No deformity, no rash.   Neurological: He is alert and oriented to person, place, and time.  Skin: Skin is warm and dry.  Psychiatric: He has a normal mood and affect. His behavior is normal.  Nursing note and vitals reviewed.   ED Course  Procedures (including critical care time) DIAGNOSTIC STUDIES: Oxygen Saturation is 100% on RA, normal by my interpretation.    COORDINATION OF CARE: 11:16 AM- Pt advised of plan for treatment and pt agrees.  Labs Review Labs Reviewed - No data to display  Imaging Review Dg Knee Complete 4 Views Left  08/22/2014   CLINICAL DATA:  Pain in the metal of the left knee. Woke up this morning with knee pain and slept with his knee bent all night. Uncertain of specific injury.  EXAM: LEFT KNEE - COMPLETE 4+ VIEW  COMPARISON:  None.   FINDINGS: There is mild patellofemoral joint space narrowing and osteophyte formation. No evidence for acute fracture or subluxation. No joint effusion.  IMPRESSION: 1.  No evidence for acute  abnormality. 2. Mild degenerative changes.   Electronically Signed   By: Nolon Nations M.D.   On: 08/22/2014 12:32     EKG Interpretation None      MDM   Final diagnoses:  Left knee pain   Pt with hx/o CKD,  DM here for evaluation of left knee pain, no trauma.  There is mild erythema to the knee with no clear effusion, there is mild suprapatellar swelling.  In abscess of effusion feel septic arthritis, gouty arthritis very unlikely.  Treating for inflammatory process - one time dose of steroids with pain meds, outpatient follow up.  Return precautions discussed.    I personally performed the services described in this documentation, which was scribed in my presence. The recorded information has been reviewed and is accurate.    Quintella Reichert, MD 08/22/14 [phone]

## 2014-08-22 NOTE — ED Notes (Signed)
Pt ambulated, pt has steady gait, does c/o  Increased pain with prolonged ambulation. NAD noted. Pt able to dress himself.

## 2014-08-22 NOTE — Discharge Instructions (Signed)

## 2014-08-22 NOTE — ED Notes (Addendum)
Pt reports left thigh pain radiating to left knee since waking up this am. Pt denies any known injury. nad noted.

## 2014-12-02 ENCOUNTER — Other Ambulatory Visit: Payer: Self-pay | Admitting: Cardiovascular Disease

## 2014-12-03 ENCOUNTER — Ambulatory Visit (INDEPENDENT_AMBULATORY_CARE_PROVIDER_SITE_OTHER): Payer: Medicaid Other | Admitting: Cardiovascular Disease

## 2014-12-03 ENCOUNTER — Other Ambulatory Visit: Payer: Self-pay | Admitting: Cardiovascular Disease

## 2014-12-03 ENCOUNTER — Encounter: Payer: Self-pay | Admitting: Cardiovascular Disease

## 2014-12-03 VITALS — BP 168/80 | HR 58 | Ht 69.5 in | Wt 207.1 lb

## 2014-12-03 DIAGNOSIS — I10 Essential (primary) hypertension: Secondary | ICD-10-CM | POA: Diagnosis not present

## 2014-12-03 DIAGNOSIS — I2510 Atherosclerotic heart disease of native coronary artery without angina pectoris: Secondary | ICD-10-CM

## 2014-12-03 DIAGNOSIS — N183 Chronic kidney disease, stage 3 (moderate): Secondary | ICD-10-CM | POA: Diagnosis not present

## 2014-12-03 DIAGNOSIS — E785 Hyperlipidemia, unspecified: Secondary | ICD-10-CM | POA: Diagnosis not present

## 2014-12-03 DIAGNOSIS — I351 Nonrheumatic aortic (valve) insufficiency: Secondary | ICD-10-CM

## 2014-12-03 DIAGNOSIS — R001 Bradycardia, unspecified: Secondary | ICD-10-CM

## 2014-12-03 LAB — LIPID PANEL
Cholesterol: 106 mg/dL — ABNORMAL LOW (ref 125–200)
HDL: 33 mg/dL — ABNORMAL LOW (ref >=40)
LDL Cholesterol: 56 mg/dL (ref <130)
Total CHOL/HDL Ratio: 3.2 Ratio (ref <=5.0)
Triglycerides: 85 mg/dL (ref <150)
VLDL: 17 mg/dL (ref <30)

## 2014-12-03 LAB — BASIC METABOLIC PANEL
BUN: 12 mg/dL (ref 7–25)
CO2: 23 mmol/L (ref 20–31)
Calcium: 9.4 mg/dL (ref 8.6–10.3)
Chloride: 108 mmol/L (ref 98–110)
Creat: 1.13 mg/dL (ref 0.70–1.33)
Glucose, Bld: 79 mg/dL (ref 65–99)
Potassium: 4 mmol/L (ref 3.5–5.3)
Sodium: 139 mmol/L (ref 135–146)

## 2014-12-03 MED ORDER — HYDRALAZINE HCL 50 MG PO TABS: 50.0000 mg | ORAL_TABLET | Freq: Three times a day (TID) | ORAL | Status: DC

## 2014-12-03 NOTE — Progress Notes (Signed)
Patient ID: [name], male   DOB: July 21, 1965, 49 y.o.   MRN: [id number]      SUBJECTIVE: The patient presents for routine follow-up. He has a history of coronary artery disease having sustained a non-STEMI with a bare-metal stent to the LAD in April 2010. He also has essential hypertension, hyperlipidemia, insulin-dependent diabetes mellitus, and a history of tobacco abuse.   Most recent echocardiogram was performed in February 2014 which showed normal left ventricular systolic function, EF 61-60%, moderate LVH, grade 2 diastolic dysfunction, and mild central aortic regurgitation.   Resides at [facility] in [place]. Here with home administrator.  He denies chest pain, palpitations, shortness of breath, and leg swelling. A review of blood pressures in the past few months show that it has been consistently elevated. On 6/29, 198/50. On 7/13, 163/78. On 8/10, 153/77. He has not had any recent blood work according to the [employer].   Review of Systems: As per "subjective", otherwise negative.  No Known Allergies  Current Outpatient Prescriptions  Medication Sig Dispense Refill  . amLODipine (NORVASC) 10 MG tablet Take 10 mg by mouth daily.      Marland Kitchen aspirin EC 81 MG tablet Take 81 mg by mouth daily.    Marland Kitchen CATAPRES-TTS-3 0.3 MG/24HR patch PLACE 1 PATCH ONTO SKIN ONCE A WEEK. REMOVE OLD PATCH. 4 patch 3  . diclofenac sodium (VOLTAREN) 1 % GEL Apply 1 application topically 4 (four) times daily.      . fish oil-omega-3 fatty acids 1000 MG capsule Take 1 g by mouth 4 (four) times daily.      . haloperidol decanoate (HALDOL DECANOATE) 100 MG/ML injection Inject 100 mg into the muscle every 14 (fourteen) days.      . hydrALAZINE (APRESOLINE) 25 MG tablet Take 1 tablet (25 mg total) by mouth 3 (three) times daily. 90 tablet 11  . hydrALAZINE (APRESOLINE) 25 MG tablet TAKE (1) TABLET BY MOUTH (3) TIMES DAILY. 90 tablet 6  . insulin glargine (LANTUS) 100 UNIT/ML injection Inject 20  Units into the skin daily.     Marland Kitchen lithium carbonate 300 MG capsule Take 600 mg by mouth at bedtime.     . metoprolol tartrate (LOPRESSOR) 25 MG tablet TAKE (1/2) TABLET BY MOUTH DAILY. 15 tablet 11  . Multiple Vitamin (DAILY VITE PO) Take 1 tablet by mouth daily.    Marland Kitchen oxyCODONE-acetaminophen (PERCOCET/ROXICET) 5-325 MG per tablet Take 1 tablet by mouth every 6 (six) hours as needed for severe pain. 6 tablet 0  . potassium chloride SA (K-DUR,KLOR-CON) 20 MEQ tablet Take 20 mEq by mouth daily.      . pravastatin (PRAVACHOL) 40 MG tablet Take 40 mg by mouth daily.      . temazepam (RESTORIL) 15 MG capsule Take 15 mg by mouth at bedtime.       No current facility-administered medications for this visit.    Past Medical History  Diagnosis Date  . Arteriosclerotic cardiovascular disease (ASCVD)     EF 60% -- Non-Q MI in 07/2008->  BMS to mid LAD  . Hypertension   . Hyperlipidemia   . Schizophrenia     Schizophrenia/bipolar disease/mental retardation  . Mild anemia     Result.  Nl H&H in 02/2011  . Diabetes mellitus, type 2     A1c of 5.6 in 05/2011  . Axillary abscess 2011    right  . Obesity   . Tobacco abuse     15 pack years; 0.5 pack per day  .  Aortic insufficiency     mild to moderate   . Cerebrovascular disease     CVA  . Chronic obstructive pulmonary disease   . Sinus bradycardia     On low dose beta blocker  . Diabetes mellitus type II     Past Surgical History  Procedure Laterality Date  . Hernia repair  1967    Social History   Social History  . Marital Status: Single    Spouse Name: N/A  . Number of Children: N/A  . Years of Education: N/A   Occupational History  . Disabled    Social History Main Topics  . Smoking status: Current Some Day Smoker -- 0.50 packs/day for 30 years    Types: Cigarettes    Start date: 12/03/1974  . Smokeless tobacco: Never Used     Comment: Patient is not motivated to quit.  He attributes his smoking to the lack of other  activities.  . Alcohol Use: No  . Drug Use: No  . Sexual Activity: Not on file   Other Topics Concern  . Not on file   Social History Narrative   Resides in an [facility]:   12/03/14 0908  BP: 168/80  Pulse: 58  Height: 5' 9.5" (1.765 m)  Weight: 207 lb 1.9 oz (93.949 kg)  SpO2: 99%    PHYSICAL EXAM General: NAD HEENT: Poor dentition. Neck: No JVD, no thyromegaly. Lungs: Clear to auscultation bilaterally with normal respiratory effort. CV: Nondisplaced PMI. Regular rate and rhythm, normal S1/S2, no B5/D9, II/VI systolic murmur over RUSB. No pretibial or periankle edema. No carotid bruit.  Abdomen: Soft, nontender, no hepatosplenomegaly, no distention.  Neurologic: Alert and oriented. Psych: Normal affect. Very pleasant. Skin: Normal. Musculoskeletal: Normal range of motion, no gross deformities. Extremities: No clubbing or cyanosis.   ECG: Most recent ECG reviewed.      ASSESSMENT AND PLAN: 1. CAD: Stable ischemic heart disease. I will continue ASA, pravastatin, and metoprolol at a reduced dose of 12.5 mg daily given resting bradycardia, albeit asymptomatic. Normal LV systolic function by most recent echo.   2. Essential HTN: It is elevated on present therapy. On amlodipine 10 mg. Will increase hydralazine to 50 mg tid. Unable to use ACEI/ARB due to CKD.  3. Hyperlipidemia: Lipid panel on 05/23/2013 demonstrated total cholesterol 118, triglycerides 89, HDL 35, LDL cigarettes. Continue pravastatin 40 mg daily.  Will repeat lipids.  4. Aortic regurgitation: Mild as assessed in 05/2012. Continue to monitor. No diastolic murmur appreciated.   5. CKD stage III-IV: On 06/11/2013, BUN 24 and creatinine 1.92. CBC was normal. Will repeat BMET.  Dispo: f/u 1 year.   Kate Sable, M.D., F.A.C.C.

## 2014-12-03 NOTE — Patient Instructions (Signed)
Your physician wants you to follow-up in: 1 year with Dr Virgina Jock will receive a reminder letter in the mail two months in advance. If you don't receive a letter, please call our office to schedule the follow-up appointment.   INCREASE Hydralazine to 50 mg three times a day    Get FASTING lab work

## 2015-03-09 ENCOUNTER — Other Ambulatory Visit: Payer: Self-pay | Admitting: Cardiovascular Disease

## 2015-03-25 ENCOUNTER — Other Ambulatory Visit: Payer: Self-pay | Admitting: Cardiovascular Disease

## 2015-05-06 ENCOUNTER — Encounter (HOSPITAL_COMMUNITY): Payer: Self-pay

## 2015-05-06 ENCOUNTER — Emergency Department (HOSPITAL_COMMUNITY)
Admission: EM | Admit: 2015-05-06 | Discharge: 2015-05-06 | Disposition: A | Discharge status: Home / Self Care | Payer: Medicaid Other | Attending: Emergency Medicine | Admitting: Emergency Medicine

## 2015-05-06 DIAGNOSIS — F17210 Nicotine dependence, cigarettes, uncomplicated: Secondary | ICD-10-CM | POA: Insufficient documentation

## 2015-05-06 DIAGNOSIS — Z7901 Long term (current) use of anticoagulants: Secondary | ICD-10-CM | POA: Insufficient documentation

## 2015-05-06 DIAGNOSIS — J449 Chronic obstructive pulmonary disease, unspecified: Secondary | ICD-10-CM | POA: Diagnosis not present

## 2015-05-06 DIAGNOSIS — L0231 Cutaneous abscess of buttock: Secondary | ICD-10-CM | POA: Diagnosis not present

## 2015-05-06 DIAGNOSIS — I10 Essential (primary) hypertension: Secondary | ICD-10-CM | POA: Insufficient documentation

## 2015-05-06 DIAGNOSIS — Z872 Personal history of diseases of the skin and subcutaneous tissue: Secondary | ICD-10-CM | POA: Insufficient documentation

## 2015-05-06 DIAGNOSIS — E785 Hyperlipidemia, unspecified: Secondary | ICD-10-CM | POA: Diagnosis not present

## 2015-05-06 DIAGNOSIS — E669 Obesity, unspecified: Secondary | ICD-10-CM | POA: Insufficient documentation

## 2015-05-06 DIAGNOSIS — I2510 Atherosclerotic heart disease of native coronary artery without angina pectoris: Secondary | ICD-10-CM | POA: Insufficient documentation

## 2015-05-06 DIAGNOSIS — F209 Schizophrenia, unspecified: Secondary | ICD-10-CM | POA: Insufficient documentation

## 2015-05-06 DIAGNOSIS — Z794 Long term (current) use of insulin: Secondary | ICD-10-CM | POA: Insufficient documentation

## 2015-05-06 DIAGNOSIS — Z8673 Personal history of transient ischemic attack (TIA), and cerebral infarction without residual deficits: Secondary | ICD-10-CM | POA: Insufficient documentation

## 2015-05-06 DIAGNOSIS — Z79899 Other long term (current) drug therapy: Secondary | ICD-10-CM | POA: Diagnosis not present

## 2015-05-06 DIAGNOSIS — Z862 Personal history of diseases of the blood and blood-forming organs and certain disorders involving the immune mechanism: Secondary | ICD-10-CM | POA: Diagnosis not present

## 2015-05-06 DIAGNOSIS — Z791 Long term (current) use of non-steroidal anti-inflammatories (NSAID): Secondary | ICD-10-CM | POA: Insufficient documentation

## 2015-05-06 DIAGNOSIS — E119 Type 2 diabetes mellitus without complications: Secondary | ICD-10-CM | POA: Diagnosis not present

## 2015-05-06 MED ORDER — AMOXICILLIN-POT CLAVULANATE 875-125 MG PO TABS: 1.0000 | ORAL_TABLET | Freq: Two times a day (BID) | ORAL | Status: DC

## 2015-05-06 NOTE — ED Provider Notes (Signed)
CSN: [id number]     Arrival date & time 05/06/15  1059 History   First MD Initiated Contact with Patient 05/06/15 1118     Chief Complaint  Patient presents with  . Abscess     (Consider location/radiation/quality/duration/timing/severity/associated sxs/prior Treatment) The history is provided by the patient and medical records.     50 year old male with history of hypertension, hyperlipidemia, diabetes, schizophrenia and mental retardation, presenting to the ED for buttock abscess. Patient states this is been there for a few days, however it busted this morning. He states there was some yellow fluid coming out. His caregiver did clean the area and put antibiotic ointment on it. He denies any fever or chills. No nausea or vomiting. No history of buttock abscesses in the past. Vital signs stable.  Past Medical History  Diagnosis Date  . Arteriosclerotic cardiovascular disease (ASCVD)     EF 60% -- Non-Q MI in 07/2008->  BMS to mid LAD  . Hypertension   . Hyperlipidemia   . Schizophrenia ([place])     Schizophrenia/bipolar disease/mental retardation  . Mild anemia     Result.  Nl H&H in 02/2011  . Diabetes mellitus, type 2 (HCC)     A1c of 5.6 in 05/2011  . Axillary abscess 2011    right  . Obesity   . Tobacco abuse     15 pack years; 0.5 pack per day  . Aortic insufficiency     mild to moderate   . Cerebrovascular disease     CVA  . Chronic obstructive pulmonary disease ([place])   . Sinus bradycardia     On low dose beta blocker  . Diabetes mellitus type II    Past Surgical History  Procedure Laterality Date  . Hernia repair  1967   Family History  Problem Relation Age of Onset  . Hypertension    . Arthritis     Social History  Substance Use Topics  . Smoking status: Current Some Day Smoker -- 0.50 packs/day for 30 years    Types: Cigarettes    Start date: 12/03/1974  . Smokeless tobacco: Never Used     Comment: Patient is not motivated to quit.  He attributes his smoking  to the lack of other activities.  . Alcohol Use: No    Review of Systems  Skin:       abscess  All other systems reviewed and are negative.     Allergies  Review of patient's allergies indicates no known allergies.  Home Medications   Prior to Admission medications   Medication Sig Start Date End Date Taking? Authorizing Provider  amLODipine (NORVASC) 10 MG tablet Take 10 mg by mouth daily.      Historical Provider, MD  aspirin EC 81 MG tablet Take 81 mg by mouth daily.    Historical Provider, MD  CATAPRES-TTS-3 0.3 MG/24HR patch PLACE 1 PATCH ONTO SKIN ONCE A WEEK. REMOVE OLD PATCH. 03/25/15   Herminio Commons, MD  diclofenac sodium (VOLTAREN) 1 % GEL Apply 1 application topically 4 (four) times daily.      Historical Provider, MD  fish oil-omega-3 fatty acids 1000 MG capsule Take 1 g by mouth 4 (four) times daily.      Historical Provider, MD  haloperidol decanoate (HALDOL DECANOATE) 100 MG/ML injection Inject 100 mg into the muscle every 14 (fourteen) days.      Historical Provider, MD  hydrALAZINE (APRESOLINE) 50 MG tablet TAKE 1 TABLET BY MOUTH THREE TIMES A DAY 03/09/15  Herminio Commons, MD  insulin glargine (LANTUS) 100 UNIT/ML injection Inject 20 Units into the skin daily.     Historical Provider, MD  lithium carbonate 300 MG capsule Take 600 mg by mouth at bedtime.     Historical Provider, MD  metoprolol tartrate (LOPRESSOR) 25 MG tablet TAKE (1/2) TABLET BY MOUTH DAILY. 07/09/14   Herminio Commons, MD  Multiple Vitamin (DAILY VITE PO) Take 1 tablet by mouth daily.    Historical Provider, MD  oxyCODONE-acetaminophen (PERCOCET/ROXICET) 5-325 MG per tablet Take 1 tablet by mouth every 6 (six) hours as needed for severe pain. 08/22/14   Quintella Reichert, MD  potassium chloride SA (K-DUR,KLOR-CON) 20 MEQ tablet Take 20 mEq by mouth daily.      Historical Provider, MD  pravastatin (PRAVACHOL) 40 MG tablet Take 40 mg by mouth daily.      Historical Provider, MD  temazepam  (RESTORIL) 15 MG capsule Take 15 mg by mouth at bedtime.      Historical Provider, MD   BP 160/59 mmHg  Pulse 53  Temp(Src) 98.7 F (37.1 C) (Oral)  Resp 16  Ht 5' 9.5" (1.765 m)  Wt 103.42 kg  BMI 33.20 kg/m2  SpO2 100%   Physical Exam  Constitutional: He is oriented to person, place, and time. He appears well-developed and well-nourished. No distress.  HENT:  Head: Normocephalic and atraumatic.  Mouth/Throat: Oropharynx is clear and moist.  Eyes: Conjunctivae and EOM are normal. Pupils are equal, round, and reactive to light.  Neck: Normal range of motion. Neck supple.  Cardiovascular: Normal rate, regular rhythm and normal heart sounds.   Pulmonary/Chest: Effort normal and breath sounds normal. No respiratory distress. He has no wheezes.  Abdominal: Soft. Bowel sounds are normal. There is no tenderness. There is no guarding.  Genitourinary:     Musculoskeletal: Normal range of motion.  Neurological: He is alert and oriented to person, place, and time.  Skin: Skin is warm and dry. He is not diaphoretic.  Psychiatric: He has a normal mood and affect.  Nursing note and vitals reviewed.   ED Course  Procedures (including critical care time) Labs Review Labs Reviewed - No data to display  Imaging Review No results found. I have personally reviewed and evaluated these images and lab results as part of my medical decision-making.   EKG Interpretation None      MDM   Final diagnoses:  Left buttock abscess   50 year old male here with buttock abscess. Patient is afebrile, nontoxic. He does have a small, open, and freely draining abscess of his left buttock that is in close proximity to rectum but is not directly perirectal. There is no significant surrounding edema or cellulitic changes. Given abscess has early ruptured spontaneously and is freely draining, do not feel further I&D required at this time. Will start on antibiotics. Continue home wound care and dressing  changes. Follow-up with PCP for recheck.  Discussed plan with patient, he/she acknowledged understanding and agreed with plan of care.  Return precautions given for new or worsening symptoms.  Larene Pickett, PA-C 05/06/15 1212  Milton Ferguson, MD 05/08/15 [phone]

## 2015-05-06 NOTE — Discharge Instructions (Signed)
Take the prescribed medication as directed.  Keep area clean at home, may continue dressing changes with antibiotic ointment. Follow-up with your primary care physician within the next few days for re-check. Return to the ED for new or worsening symptoms.  Abscess An abscess is an infected area that contains a collection of pus and debris.It can occur in almost any part of the body. An abscess is also known as a furuncle or boil. CAUSES  An abscess occurs when tissue gets infected. This can occur from blockage of oil or sweat glands, infection of hair follicles, or a minor injury to the skin. As the body tries to fight the infection, pus collects in the area and creates pressure under the skin. This pressure causes pain. People with weakened immune systems have difficulty fighting infections and get certain abscesses more often.  SYMPTOMS Usually an abscess develops on the skin and becomes a painful mass that is red, warm, and tender. If the abscess forms under the skin, you may feel a moveable soft area under the skin. Some abscesses break open (rupture) on their own, but most will continue to get worse without care. The infection can spread deeper into the body and eventually into the bloodstream, causing you to feel ill.  DIAGNOSIS  Your caregiver will take your medical history and perform a physical exam. A sample of fluid may also be taken from the abscess to determine what is causing your infection. TREATMENT  Your caregiver may prescribe antibiotic medicines to fight the infection. However, taking antibiotics alone usually does not cure an abscess. Your caregiver may need to make a small cut (incision) in the abscess to drain the pus. In some cases, gauze is packed into the abscess to reduce pain and to continue draining the area. HOME CARE INSTRUCTIONS   Only take over-the-counter or prescription medicines for pain, discomfort, or fever as directed by your caregiver.  If you were  prescribed antibiotics, take them as directed. Finish them even if you start to feel better.  If gauze is used, follow your caregiver's directions for changing the gauze.  To avoid spreading the infection:  Keep your draining abscess covered with a bandage.  Wash your hands well.  Do not share personal care items, towels, or whirlpools with others.  Avoid skin contact with others.  Keep your skin and clothes clean around the abscess.  Keep all follow-up appointments as directed by your caregiver. SEEK MEDICAL CARE IF:   You have increased pain, swelling, redness, fluid drainage, or bleeding.  You have muscle aches, chills, or a general ill feeling.  You have a fever. MAKE SURE YOU:   Understand these instructions.  Will watch your condition.  Will get help right away if you are not doing well or get worse.   This information is not intended to replace advice given to you by your health care provider. Make sure you discuss any questions you have with your health care provider.   Document Released: 12/29/2004 Document Revised: 09/20/2011 Document Reviewed: 06/03/2011 Elsevier Interactive Patient Education [employer].

## 2015-05-06 NOTE — ED Notes (Signed)
He has a place on his buttocks, that may have been an abscess but he took a shower and whatever was in it fell out. Caregiver states that it is now a hole and she placed triple antibiotic ointment on it.

## 2015-07-07 ENCOUNTER — Other Ambulatory Visit: Payer: Self-pay | Admitting: Cardiovascular Disease

## 2015-09-04 ENCOUNTER — Other Ambulatory Visit: Payer: Self-pay | Admitting: Cardiovascular Disease

## 2015-11-03 ENCOUNTER — Other Ambulatory Visit: Payer: Self-pay | Admitting: Cardiovascular Disease

## 2015-11-18 ENCOUNTER — Ambulatory Visit: Payer: Medicaid Other | Admitting: Cardiovascular Disease

## 2015-11-30 ENCOUNTER — Encounter: Payer: Self-pay | Admitting: Cardiology

## 2015-11-30 ENCOUNTER — Ambulatory Visit: Payer: Medicaid Other | Admitting: Cardiology

## 2015-12-10 ENCOUNTER — Encounter: Payer: Self-pay | Admitting: Adult Health

## 2015-12-10 ENCOUNTER — Ambulatory Visit (INDEPENDENT_AMBULATORY_CARE_PROVIDER_SITE_OTHER): Payer: Medicaid Other | Admitting: Adult Health

## 2015-12-10 VITALS — BP 178/86 | HR 78 | Ht 69.5 in | Wt 235.0 lb

## 2015-12-10 DIAGNOSIS — I10 Essential (primary) hypertension: Secondary | ICD-10-CM

## 2015-12-10 DIAGNOSIS — Z720 Tobacco use: Secondary | ICD-10-CM | POA: Diagnosis not present

## 2015-12-10 MED ORDER — HYDRALAZINE HCL 50 MG PO TABS: 75.0000 mg | ORAL_TABLET | Freq: Three times a day (TID) | ORAL | 2 refills | Status: DC

## 2015-12-10 NOTE — Patient Instructions (Signed)
Your physician wants you to follow-up in: 6 Months with Dr. Bronson Ing. You will receive a reminder letter in the mail two months in advance. If you don't receive a letter, please call our office to schedule the follow-up appointment.  Your physician has recommended you make the following change in your medication: Increase Hydralazine to 75 mg Three Times Daily.   If you need a refill on your cardiac medications before your next appointment, please call your pharmacy.  Thank you for choosing [facility]!

## 2015-12-10 NOTE — Progress Notes (Signed)
Name: [name]    DOB: [birth year]  Age: 50 y.o.  MR#: [id number]       PCP:  Alonza Bogus, MD      Insurance: Payor: MEDICAID  / Plan: MEDICAID [place] ACCESS / Product Type: *No Product type* /   CC:   No chief complaint on file.   VS Vitals:   12/10/15 1534  Pulse: 78  SpO2: 99%  Weight: 235 lb (106.6 kg)  Height: 5' 9.5" (1.765 m)    Weights Current Weight  12/10/15 235 lb (106.6 kg)  05/06/15 228 lb (103.4 kg)  12/03/14 207 lb 1.9 oz (93.9 kg)    Blood Pressure  BP Readings from Last 3 Encounters:  05/06/15 160/59  12/03/14 (!) 168/80  08/22/14 168/72     Admit date:  (Not on file) Last encounter with RMR:  Visit date not found   Allergy Review of patient's allergies indicates no known allergies.  Current Outpatient Prescriptions  Medication Sig Dispense Refill  . amLODipine (NORVASC) 10 MG tablet Take 10 mg by mouth daily.      Marland Kitchen amoxicillin-clavulanate (AUGMENTIN) 875-125 MG tablet Take 1 tablet by mouth every 12 (twelve) hours. 20 tablet 0  . aspirin EC 81 MG tablet Take 81 mg by mouth daily.    Marland Kitchen CATAPRES-TTS-3 0.3 MG/24HR patch PLACE 1 PATCH ONTO SKIN ONCE A WEEK. REMOVE OLD PATCH. 4 patch 11  . diclofenac sodium (VOLTAREN) 1 % GEL Apply 1 application topically 4 (four) times daily.      . fish oil-omega-3 fatty acids 1000 MG capsule Take 1 g by mouth 4 (four) times daily.      . haloperidol decanoate (HALDOL DECANOATE) 100 MG/ML injection Inject 100 mg into the muscle every 14 (fourteen) days.      . hydrALAZINE (APRESOLINE) 50 MG tablet TAKE 1 TABLET BY MOUTH THREE TIMES A DAY 90 tablet 3  . insulin glargine (LANTUS) 100 UNIT/ML injection Inject 20 Units into the skin daily.     Marland Kitchen lithium carbonate 300 MG capsule Take 600 mg by mouth at bedtime.     . metoprolol tartrate (LOPRESSOR) 25 MG tablet TAKE (1/2) TABLET BY MOUTH DAILY. 15 tablet 3  . Multiple Vitamin (DAILY VITE PO) Take 1 tablet by mouth daily.    Marland Kitchen oxyCODONE-acetaminophen  (PERCOCET/ROXICET) 5-325 MG per tablet Take 1 tablet by mouth every 6 (six) hours as needed for severe pain. 6 tablet 0  . potassium chloride SA (K-DUR,KLOR-CON) 20 MEQ tablet Take 20 mEq by mouth daily.      . pravastatin (PRAVACHOL) 40 MG tablet Take 40 mg by mouth daily.      . temazepam (RESTORIL) 15 MG capsule Take 15 mg by mouth at bedtime.       No current facility-administered medications for this visit.     Discontinued Meds:   There are no discontinued medications.  Patient Active Problem List   Diagnosis Date Noted  . Acute renal failure ([place]) 12/17/2012  . Dehydration 12/17/2012  . Diarrhea 12/17/2012  . Gastroenteritis 12/17/2012  . GERD (gastroesophageal reflux disease) 12/17/2012  . Metabolic acidosis 59/97/7414  . Chronic kidney disease, stage 2, mildly decreased GFR 03/19/2012  . Arteriosclerotic cardiovascular disease (ASCVD)   . Schizophrenia ([place])   . Tobacco abuse   . Diabetes mellitus, type 2 ([place])   . Hypertension   . Hyperlipidemia   . Aortic insufficiency     LABS    Component Value Date/Time   NA 139 12/03/2014  1152   NA 141 06/11/2013 1137   NA 142 12/20/2012 0448   K 4.0 12/03/2014 1152   K 3.9 06/11/2013 1137   K 3.4 (L) 12/20/2012 0448   CL 108 12/03/2014 1152   CL 104 06/11/2013 1137   CL 112 12/20/2012 0448   CO2 23 12/03/2014 1152   CO2 24 06/11/2013 1137   CO2 22 12/20/2012 0448   GLUCOSE 79 12/03/2014 1152   GLUCOSE 87 06/11/2013 1137   GLUCOSE 111 (H) 12/20/2012 0448   BUN 12 12/03/2014 1152   BUN 24 (H) 06/11/2013 1137   BUN 26 (H) 12/20/2012 0448   CREATININE 1.13 12/03/2014 1152   CREATININE 1.92 (H) 06/11/2013 1137   CREATININE 1.77 (H) 12/20/2012 0448   CREATININE 2.33 (H) 12/19/2012 0448   CREATININE 1.33 04/11/2012 1345   CALCIUM 9.4 12/03/2014 1152   CALCIUM 10.1 06/11/2013 1137   CALCIUM 9.2 12/20/2012 0448   GFRNONAA 39 (L) 06/11/2013 1137   GFRNONAA 43 (L) 12/20/2012 0448   GFRNONAA 31 (L) 12/19/2012 0448    GFRAA 46 (L) 06/11/2013 1137   GFRAA 50 (L) 12/20/2012 0448   GFRAA 36 (L) 12/19/2012 0448   CMP     Component Value Date/Time   NA 139 12/03/2014 1152   K 4.0 12/03/2014 1152   CL 108 12/03/2014 1152   CO2 23 12/03/2014 1152   GLUCOSE 79 12/03/2014 1152   BUN 12 12/03/2014 1152   CREATININE 1.13 12/03/2014 1152   CALCIUM 9.4 12/03/2014 1152   PROT 7.7 06/11/2013 1137   ALBUMIN 3.7 06/11/2013 1137   AST 14 06/11/2013 1137   ALT 8 06/11/2013 1137   ALKPHOS 91 06/11/2013 1137   BILITOT 0.7 06/11/2013 1137   GFRNONAA 39 (L) 06/11/2013 1137   GFRAA 46 (L) 06/11/2013 1137       Component Value Date/Time   WBC 9.2 06/11/2013 1137   WBC 7.0 12/19/2012 0448   WBC 8.3 12/18/2012 0241   HGB 13.2 06/11/2013 1137   HGB 11.0 (L) 12/19/2012 0448   HGB 11.1 (L) 12/18/2012 0241   HCT 39.2 06/11/2013 1137   HCT 32.7 (L) 12/19/2012 0448   HCT 32.7 (L) 12/18/2012 0241   MCV 82.4 06/11/2013 1137   MCV 82.6 12/19/2012 0448   MCV 82.0 12/18/2012 0241    Lipid Panel     Component Value Date/Time   CHOL 106 (L) 12/03/2014 1152   TRIG 85 12/03/2014 1152   HDL 33 (L) 12/03/2014 1152   CHOLHDL 3.2 12/03/2014 1152   VLDL 17 12/03/2014 1152   LDLCALC 56 12/03/2014 1152    ABG No results found for: PHART, PCO2ART, PO2ART, HCO3, TCO2, ACIDBASEDEF, O2SAT   Lab Results  Component Value Date   TSH 2.268 04/11/2012   BNP (last 3 results) No results for input(s): BNP in the last 8760 hours.  ProBNP (last 3 results) No results for input(s): PROBNP in the last 8760 hours.  Cardiac Panel (last 3 results) No results for input(s): CKTOTAL, CKMB, TROPONINI, RELINDX in the last 72 hours.  Iron/TIBC/Ferritin/ %Sat No results found for: IRON, TIBC, FERRITIN, IRONPCTSAT   EKG Orders placed or performed in visit on 12/10/15  . EKG 12-Lead     Prior Assessment and Plan Problem List as of 12/10/2015 Reviewed: 12/03/2014  9:13 AM by Prentice Docker, MD     Cardiovascular and Mediastinum    Hypertension   Last Assessment & Plan 09/06/2012 Office Visit Written 09/06/2012  2:08 PM by Kathlen Brunswick, MD  Blood pressure control remains suboptimal. To exclude whitecoat hypertension, a 24-hour monitor will be provided and medication adjusted accordingly.      Aortic insufficiency   Last Assessment & Plan 09/06/2012 Office Visit Written 09/06/2012  2:04 PM by Yehuda Savannah, MD    Echocardiogram suggested aortic valve disease is clinically insignificant. Serial echocardiograms do not appear to be warranted.      Arteriosclerotic cardiovascular disease (ASCVD)   Last Assessment & Plan 09/06/2012 Office Visit Edited 09/07/2012 10:03 AM by Yehuda Savannah, MD    No segmental wall motion abnormality appreciated on echocardiography. With normal LV systolic function and single-vessel disease, prognosis should be excellent. We will continue to control cardiovascular risk factors to the extent possible.        Digestive   Gastroenteritis   GERD (gastroesophageal reflux disease)     Endocrine   Diabetes mellitus, type 2 [facility])   Last Assessment & Plan 03/19/2012 Office Visit Written 03/19/2012 12:22 PM by Yehuda Savannah, MD    Recent hemoglobin A1c level of 5.8 indicating mild diabetes in excellent control.        Genitourinary   Chronic kidney disease, stage 2, mildly decreased GFR   Last Assessment & Plan 09/06/2012 Office Visit Written 09/06/2012  2:06 PM by Yehuda Savannah, MD    Renal function was stable to improved when last assessed in 04/2012. Continued monitoring at fairly long intervals is appropriate.      Acute renal failure [facility])     Other   Hyperlipidemia   Last Assessment & Plan 09/06/2012 Office Visit Written 09/06/2012  2:08 PM by Yehuda Savannah, MD    Lipid profile was excellent when assessed less than one year ago. Current therapy with pravastatin is adequate.      Schizophrenia [facility])   Tobacco abuse   Last Assessment & Plan 03/19/2012 Office Visit Written  03/19/2012 12:09 PM by Yehuda Savannah, MD    Patient claims very modest tobacco consumption.  He is encouraged to quit entirely.      Dehydration   Diarrhea   Metabolic acidosis       Imaging: No results found.

## 2015-12-10 NOTE — Progress Notes (Signed)
Cardiology Office Note   Date:  12/10/2015   ID:  [name], DOB July 01, 1965, MRN [id number]  PCP:  Alonza Bogus, MD  Cardiologist: [name], NP   Chief Complaint  Patient presents with  . Hypertension  . Nicotine Dependence      History of Present Illness: [name] is a 50 y.o. male who presents for ongoing assessment and management of coronary artery disease with history of bare metal stent to the left anterior descending artery in the setting of non-ST elevation MI in April 2010. Other history includes hypertension, hyperlipidemia, insulin-dependent diabetes, and history of tobacco abuse. He was last seen in the office on 12/03/2014. At that time he was clinically stable, with mild elevation and blood pressure. The patient's hydralazine was increased to 50 mg 3 times a day. He was unable to use a/ARB due to chronic kidney disease. He is here for one year follow-up.  He comes today without complaints, he is not been admitted to the hospital, he has not been to the emergency room, he has no new diagnoses. He unfortunately continues to smoke. He is being cared for by a group home, [facility]. I have reviewed his past vital signs by a former providers by reviewing paperwork brought with him from his facility. He remains hypertensive. Her pressures a thin running in the 950D and 326Z systolic.  Past Medical History:  Diagnosis Date  . Aortic insufficiency    mild to moderate   . Arteriosclerotic cardiovascular disease (ASCVD)    EF 60% -- Non-Q MI in 07/2008->  BMS to mid LAD  . Axillary abscess 2011   right  . Cerebrovascular disease    CVA  . Chronic obstructive pulmonary disease ([place])   . Diabetes mellitus type II   . Diabetes mellitus, type 2 (HCC)    A1c of 5.6 in 05/2011  . Hyperlipidemia   . Hypertension   . Mild anemia    Result.  Nl H&H in 02/2011  . Obesity   . Schizophrenia ([place])    Schizophrenia/bipolar disease/mental retardation   . Sinus bradycardia    On low dose beta blocker  . Tobacco abuse    15 pack years; 0.5 pack per day    Past Surgical History:  Procedure Laterality Date  . HERNIA REPAIR  1967     Current Outpatient Prescriptions  Medication Sig Dispense Refill  . amLODipine (NORVASC) 10 MG tablet Take 10 mg by mouth daily.      Marland Kitchen amoxicillin-clavulanate (AUGMENTIN) 875-125 MG tablet Take 1 tablet by mouth every 12 (twelve) hours. 20 tablet 0  . aspirin EC 81 MG tablet Take 81 mg by mouth daily.    Marland Kitchen CATAPRES-TTS-3 0.3 MG/24HR patch PLACE 1 PATCH ONTO SKIN ONCE A WEEK. REMOVE OLD PATCH. 4 patch 11  . diclofenac sodium (VOLTAREN) 1 % GEL Apply 1 application topically 4 (four) times daily.      . fish oil-omega-3 fatty acids 1000 MG capsule Take 1 g by mouth 4 (four) times daily.      . haloperidol decanoate (HALDOL DECANOATE) 100 MG/ML injection Inject 100 mg into the muscle every 14 (fourteen) days.      . insulin glargine (LANTUS) 100 UNIT/ML injection Inject 20 Units into the skin daily.     Marland Kitchen lithium carbonate 300 MG capsule Take 600 mg by mouth at bedtime.     . metoprolol tartrate (LOPRESSOR) 25 MG tablet TAKE (1/2) TABLET BY MOUTH DAILY. 15 tablet 3  .  Multiple Vitamin (DAILY VITE PO) Take 1 tablet by mouth daily.    Marland Kitchen oxyCODONE-acetaminophen (PERCOCET/ROXICET) 5-325 MG per tablet Take 1 tablet by mouth every 6 (six) hours as needed for severe pain. 6 tablet 0  . potassium chloride SA (K-DUR,KLOR-CON) 20 MEQ tablet Take 20 mEq by mouth daily.      . pravastatin (PRAVACHOL) 40 MG tablet Take 40 mg by mouth daily.      . temazepam (RESTORIL) 15 MG capsule Take 15 mg by mouth at bedtime.      . hydrALAZINE (APRESOLINE) 50 MG tablet Take 1.5 tablets (75 mg total) by mouth 3 (three) times daily. 135 tablet 2   No current facility-administered medications for this visit.     Allergies:   Review of patient's allergies indicates no known allergies.    Social History:  The patient  reports that he  has been smoking Cigarettes.  He started smoking about 41 years ago. He has a 15.00 pack-year smoking history. He has never used smokeless tobacco. He reports that he does not drink alcohol or use drugs.   Family History:  The patient's family history is not on file.    ROS: All other systems are reviewed and negative. Unless otherwise mentioned in H&P    PHYSICAL EXAM: VS:  BP (!) 178/86   Pulse 78   Ht 5' 9.5" (1.765 m)   Wt 235 lb (106.6 kg)   SpO2 99%   BMI 34.21 kg/m  , BMI Body mass index is 34.21 kg/m. GEN: Well nourished, well developed, in no acute distress  HEENT: normal  Neck: no JVD, carotid bruits, or masses Cardiac: RRR; no murmurs, rubs, or gallops, small amount of dependent edema  Respiratory:  Clear to auscultation bilaterally, normal work of breathing GI: soft, nontender, nondistended, + BS MS: no deformity or atrophy  Skin: warm and dry, no rash Neuro:  Strength and sensation are intact Psych: euthymic mood, full affect   Lipid Panel    Component Value Date/Time   CHOL 106 (L) 12/03/2014 1152   TRIG 85 12/03/2014 1152   HDL 33 (L) 12/03/2014 1152   CHOLHDL 3.2 12/03/2014 1152   VLDL 17 12/03/2014 1152   LDLCALC 56 12/03/2014 1152      Wt Readings from Last 3 Encounters:  12/10/15 235 lb (106.6 kg)  05/06/15 228 lb (103.4 kg)  12/03/14 207 lb 1.9 oz (93.9 kg)      ASSESSMENT AND PLAN:  1.  Hypertension: Blood pressure is not well controlled. I reviewed prior office notes from other providers and he remains elevated on those visits. I will increase his hydralazine to 75 mg 3 times a day from 50 mg 3 times a day. He will need to follow-up in about 6 months.  2. Ongoing tobacco abuse: I advised him on smoking cessation. He is not inclined to stop at this time.   Current medicines are reviewed at length with the patient today.    Labs/ tests ordered today include:   Orders Placed This Encounter  Procedures  . EKG 12-Lead     Disposition:    FU with 6 months. Signed, Jory Sims, NP  12/10/2015 4:34 PM    [facility] [address], [place], [place] [address] Phone: [phone]; Fax: [phone]

## 2016-01-04 DIAGNOSIS — F250 Schizoaffective disorder, bipolar type: Secondary | ICD-10-CM | POA: Insufficient documentation

## 2016-01-11 ENCOUNTER — Emergency Department (HOSPITAL_COMMUNITY)
Admission: EM | Admit: 2016-01-11 | Discharge: 2016-01-11 | Disposition: A | Discharge status: Home / Self Care | Payer: Medicaid Other | Attending: Emergency Medicine | Admitting: Emergency Medicine

## 2016-01-11 ENCOUNTER — Encounter (HOSPITAL_COMMUNITY): Payer: Self-pay | Admitting: Emergency Medicine

## 2016-01-11 ENCOUNTER — Emergency Department (HOSPITAL_COMMUNITY): Payer: Medicaid Other

## 2016-01-11 DIAGNOSIS — R0602 Shortness of breath: Secondary | ICD-10-CM | POA: Diagnosis not present

## 2016-01-11 DIAGNOSIS — N182 Chronic kidney disease, stage 2 (mild): Secondary | ICD-10-CM | POA: Insufficient documentation

## 2016-01-11 DIAGNOSIS — M25562 Pain in left knee: Secondary | ICD-10-CM | POA: Diagnosis present

## 2016-01-11 DIAGNOSIS — I2510 Atherosclerotic heart disease of native coronary artery without angina pectoris: Secondary | ICD-10-CM | POA: Insufficient documentation

## 2016-01-11 DIAGNOSIS — M1712 Unilateral primary osteoarthritis, left knee: Secondary | ICD-10-CM | POA: Diagnosis not present

## 2016-01-11 DIAGNOSIS — Z794 Long term (current) use of insulin: Secondary | ICD-10-CM | POA: Diagnosis not present

## 2016-01-11 DIAGNOSIS — R059 Cough, unspecified: Secondary | ICD-10-CM

## 2016-01-11 DIAGNOSIS — F17210 Nicotine dependence, cigarettes, uncomplicated: Secondary | ICD-10-CM | POA: Diagnosis not present

## 2016-01-11 DIAGNOSIS — I129 Hypertensive chronic kidney disease with stage 1 through stage 4 chronic kidney disease, or unspecified chronic kidney disease: Secondary | ICD-10-CM | POA: Diagnosis not present

## 2016-01-11 DIAGNOSIS — E1122 Type 2 diabetes mellitus with diabetic chronic kidney disease: Secondary | ICD-10-CM | POA: Insufficient documentation

## 2016-01-11 DIAGNOSIS — Z79899 Other long term (current) drug therapy: Secondary | ICD-10-CM | POA: Diagnosis not present

## 2016-01-11 DIAGNOSIS — R05 Cough: Secondary | ICD-10-CM | POA: Diagnosis not present

## 2016-01-11 MED ORDER — DICLOFENAC SODIUM 1 % TD GEL: 4.0000 g | Freq: Four times a day (QID) | TRANSDERMAL | 0 refills | Status: DC

## 2016-01-11 MED ORDER — CVS HEATING PAD PADS: 1.0000 [IU] | MEDICATED_PAD | Freq: Two times a day (BID) | 0 refills | Status: DC

## 2016-01-11 MED ORDER — BENZONATATE 100 MG PO CAPS
200.0000 mg | ORAL_CAPSULE | Freq: Once | ORAL | Status: AC
  Administered 2016-01-11: 200 mg via ORAL
  Filled 2016-01-11: qty 2

## 2016-01-11 MED ORDER — ACETAMINOPHEN 500 MG PO TABS: 500.0000 mg | ORAL_TABLET | Freq: Four times a day (QID) | ORAL | 0 refills | Status: AC | PRN

## 2016-01-11 MED ORDER — BENZONATATE 100 MG PO CAPS: 200.0000 mg | ORAL_CAPSULE | Freq: Three times a day (TID) | ORAL | 0 refills | Status: DC | PRN

## 2016-01-11 NOTE — Discharge Instructions (Signed)
Use the medicines prescribed.  Apply a heating pad to your left knee 20 minutes twice daily as needed for arthritis pain relief.  You may also benefit from taking arthritis strength tylenol which has been prescribed to you.

## 2016-01-11 NOTE — ED Triage Notes (Addendum)
Pt reports left knee pain, no injury.  Pt was sleeping in bed when pain started this morning.  Pt reports coughing up sputum (clear) x1 month.  Pt denies cp/sob.  Pt alert and oriented, speaking in complete sentences.

## 2016-01-11 NOTE — ED Provider Notes (Signed)
[facility] DEPT Provider Note   CSN: [id number] Arrival date & time: 01/11/16  1415  By signing my name below, I, [name], attest that this documentation has been prepared under the direction and in the presence of Jeff Jefferson, PA-C. Electronically Signed: Rayna Brown, ED Scribe. 01/11/16. 2:18 AM.   History   Chief Complaint Chief Complaint  Patient presents with  . Knee Pain  . Cough    HPI HPI Comments: [name] is a 50 y.o. male who presents to the Emergency Department complaining of constant, mild, productive cough with clear sputum x 4 months. He reports associated SOB with bouts of coughing. He states he eats hard candy at night for his cough which provides mild short term relief and states his cough is worse at night and is triggered by a dry feeling throat.  Pt resides in assisted living. No leg swelling or other associated symptoms at this time.   He also complains of mild, atraumatic, left anterior knee pain onset this morning. He states he was sitting and eating with an outstretched left leg and then could not bend his leg due to pain. He reports a h/o similar symptoms and reports having been told he has arthritis in his left knee. His records indicate he was rx Voltaren gel in the past which he applied to his ankle for an ankle sprain but denies he has applied this to his left knee.  No other associated symptoms at this time.   The history is provided by the patient. No language interpreter was used.    Past Medical History:  Diagnosis Date  . Aortic insufficiency    mild to moderate   . Arteriosclerotic cardiovascular disease (ASCVD)    EF 60% -- Non-Q MI in 07/2008->  BMS to mid LAD  . Axillary abscess 2011   right  . Cerebrovascular disease    CVA  . Chronic obstructive pulmonary disease ([place])   . Diabetes mellitus type II   . Diabetes mellitus, type 2 (HCC)    A1c of 5.6 in 05/2011  . Hyperlipidemia   . Hypertension   . Mild anemia    Result.  Nl H&H in 02/2011  . Obesity   . Schizophrenia ([place])    Schizophrenia/bipolar disease/mental retardation  . Sinus bradycardia    On low dose beta blocker  . Tobacco abuse    15 pack years; 0.5 pack per day    Patient Active Problem List   Diagnosis Date Noted  . Acute renal failure ([place]) 12/17/2012  . Dehydration 12/17/2012  . Diarrhea 12/17/2012  . Gastroenteritis 12/17/2012  . GERD (gastroesophageal reflux disease) 12/17/2012  . Metabolic acidosis 94/76/5465  . Chronic kidney disease, stage 2, mildly decreased GFR 03/19/2012  . Arteriosclerotic cardiovascular disease (ASCVD)   . Schizophrenia ([place])   . Tobacco abuse   . Diabetes mellitus, type 2 ([place])   . Hypertension   . Hyperlipidemia   . Aortic insufficiency     Past Surgical History:  Procedure Laterality Date  . [place] Medications    Prior to Admission medications   Medication Sig Start Date End Date Taking? Authorizing Provider  acetaminophen (TYLENOL) 500 MG tablet Take 1 tablet (500 mg total) by mouth every 6 (six) hours as needed. 01/11/16   Jeff Jefferson, PA-C  amLODipine (NORVASC) 10 MG tablet Take 10 mg by mouth daily.      Historical Provider, MD  amoxicillin-clavulanate (AUGMENTIN) [phone]  MG tablet Take 1 tablet by mouth every 12 (twelve) hours. 05/06/15   Larene Pickett, PA-C  aspirin EC 81 MG tablet Take 81 mg by mouth daily.    Historical Provider, MD  benzonatate (TESSALON) 100 MG capsule Take 2 capsules (200 mg total) by mouth 3 (three) times daily as needed for cough. 01/11/16   Jeff Jefferson, PA-C  CATAPRES-TTS-3 0.3 MG/24HR patch PLACE 1 PATCH ONTO SKIN ONCE A WEEK. REMOVE OLD PATCH. 03/25/15   Herminio Commons, MD  fish oil-omega-3 fatty acids 1000 MG capsule Take 1 g by mouth 4 (four) times daily.      Historical Provider, MD  haloperidol decanoate (HALDOL DECANOATE) 100 MG/ML injection Inject 100 mg into the muscle every 14 (fourteen) days.      Historical Provider,  MD  Heating Pads ([employer] HEATING PAD) PADS 1 Units by Does not apply route 2 (two) times daily. 01/11/16   Jeff Jefferson, PA-C  hydrALAZINE (APRESOLINE) 50 MG tablet Take 1.5 tablets (75 mg total) by mouth 3 (three) times daily. 12/10/15 03/09/16  Lendon Colonel, NP  insulin glargine (LANTUS) 100 UNIT/ML injection Inject 20 Units into the skin daily.     Historical Provider, MD  lithium carbonate 300 MG capsule Take 600 mg by mouth at bedtime.     Historical Provider, MD  metoprolol tartrate (LOPRESSOR) 25 MG tablet TAKE (1/2) TABLET BY MOUTH DAILY. 11/04/15   Herminio Commons, MD  Multiple Vitamin (DAILY VITE PO) Take 1 tablet by mouth daily.    Historical Provider, MD  oxyCODONE-acetaminophen (PERCOCET/ROXICET) 5-325 MG per tablet Take 1 tablet by mouth every 6 (six) hours as needed for severe pain. 08/22/14   Quintella Reichert, MD  potassium chloride SA (K-DUR,KLOR-CON) 20 MEQ tablet Take 20 mEq by mouth daily.      Historical Provider, MD  pravastatin (PRAVACHOL) 40 MG tablet Take 40 mg by mouth daily.      Historical Provider, MD  temazepam (RESTORIL) 15 MG capsule Take 15 mg by mouth at bedtime.      Historical Provider, MD    Family History Family History  Problem Relation Age of Onset  . Hypertension    . Arthritis      Social History Social History  Substance Use Topics  . Smoking status: Current Some Day Smoker    Packs/day: 0.50    Years: 30.00    Types: Cigarettes    Start date: 12/03/1974  . Smokeless tobacco: Never Used     Comment: Patient is not motivated to quit.  He attributes his smoking to the lack of other activities.  . Alcohol use No     Allergies   Review of patient's allergies indicates no known allergies.   Review of Systems Review of Systems  Constitutional: Negative for chills and fever.  Respiratory: Positive for cough and shortness of breath.   Cardiovascular: Negative for leg swelling.  Musculoskeletal: Positive for arthralgias.  Skin: Negative for  color change and wound.   Physical Exam Updated Vital Signs BP 169/80 (BP Location: Left Arm)   Pulse 62   Temp 98.1 F (36.7 C) (Oral)   Resp 18   Ht $R'5\' 10"'Jf$  (1.778 m)   Wt 107.5 kg   SpO2 98%   BMI 34.01 kg/m   Physical Exam  Constitutional: He is oriented to person, place, and time.  HENT:  Head: Normocephalic and atraumatic.  Eyes: EOM are normal.  Neck: Normal range of motion.  Cardiovascular: Normal rate.  Pulmonary/Chest: Effort normal and breath sounds normal. No respiratory distress. He has no wheezes. He has no rales.  Abdominal: Soft.  Musculoskeletal: Normal range of motion.       Left knee: He exhibits normal range of motion, no swelling, no effusion, no deformity, no erythema, normal alignment, no LCL laxity and no MCL laxity. Tenderness found. Lateral joint line tenderness noted.  No peripheral edema noted. No crepitus, negative Lachman and negative drawer tests of left knee.   Neurological: He is alert and oriented to person, place, and time.  Skin: Skin is warm and dry.  Psychiatric: He has a normal mood and affect.  Nursing note and vitals reviewed.  ED Treatments / Results  Labs (all labs ordered are listed, but only abnormal results are displayed) Labs Reviewed - No data to display  EKG  EKG Interpretation None       Radiology Dg Chest 2 View  Result Date: 01/11/2016 CLINICAL DATA:  One month of productive cough. Long-term smoker. History of diabetes and hypertension than aortic insufficiency EXAM: CHEST  2 VIEW COMPARISON:  PA and lateral chest x-ray dated January 29, 2011 FINDINGS: The lungs are well-expanded. There is no focal infiltrate. There is no pleural effusion. The heart and pulmonary vascularity are normal. There is calcification in the wall of the aortic arch. There is multilevel degenerative disc disease of the thoracic spine with anterior and right lateral bridging osteophytes. IMPRESSION: There is no CHF, pneumonia, nor other acute  cardiopulmonary abnormality. There are mild chronic bronchitic changes. Electronically Signed   By: David  Martinique M.D.   On: 01/11/2016 15:18    Procedures Procedures  DIAGNOSTIC STUDIES: Oxygen Saturation is 98% on RA, normal by my interpretation.    COORDINATION OF CARE: 6:53 PM Discussed next steps with pt. Pt verbalized understanding and is agreeable with the plan.    Medications Ordered in ED Medications  benzonatate (TESSALON) capsule 200 mg (200 mg Oral Given 01/11/16 1937)     Initial Impression / Assessment and Plan / ED Course  I have reviewed the triage vital signs and the nursing notes.  Pertinent labs & imaging results that were available during my care of the patient were reviewed by me and considered in my medical decision making (see chart for details).  Clinical Course    Pt with chronic cough with negative cxr. No sob (except while coughing) and no cp. He was prescribed tessalon perles, advised water to bedside to help with mouth and throat dryness.  Arthritis strength tylenol for knee pain.  Old imaging of this knee reviewed showing mild arthritis.  No indication today for repeat imaging.  Advised heat tx.  Pt asked for a prescription for heating pad which was given, but I am not sure this will be honored by his insurance. Advised recheck by pcp in one week if not improving.  I personally performed the services described in this documentation, which was scribed in my presence. The recorded information has been reviewed and is accurate.  Final Clinical Impressions(s) / ED Diagnoses   Final diagnoses:  Cough  Primary osteoarthritis of left knee    New Prescriptions Discharge Medication List as of 01/11/2016  7:35 PM    START taking these medications   Details  acetaminophen (TYLENOL) 500 MG tablet Take 1 tablet (500 mg total) by mouth every 6 (six) hours as needed., Starting Mon 01/11/2016, Print    Heating Pads ([employer] HEATING PAD) PADS 1 Units by Does not apply  route  2 (two) times daily., Starting Mon 01/11/2016, Print         Jeff Jefferson, PA-C 01/12/16 [id number]    Fredia Sorrow, MD 01/12/16 [phone]

## 2016-02-04 ENCOUNTER — Other Ambulatory Visit: Payer: Self-pay | Admitting: Adult Health

## 2016-03-08 ENCOUNTER — Other Ambulatory Visit: Payer: Self-pay | Admitting: Cardiovascular Disease

## 2016-03-08 ENCOUNTER — Other Ambulatory Visit: Payer: Self-pay | Admitting: Adult Health

## 2016-04-14 ENCOUNTER — Other Ambulatory Visit: Payer: Self-pay | Admitting: Cardiovascular Disease

## 2016-06-07 ENCOUNTER — Other Ambulatory Visit (HOSPITAL_COMMUNITY)
Admission: RE | Admit: 2016-06-07 | Discharge: 2016-06-07 | Disposition: A | Discharge status: Home / Self Care | Payer: Medicaid Other | Source: Ambulatory Visit | Attending: Adult Health | Admitting: Adult Health

## 2016-06-07 ENCOUNTER — Ambulatory Visit: Payer: Medicaid Other | Admitting: Adult Health

## 2016-06-07 ENCOUNTER — Encounter: Payer: Self-pay | Admitting: Adult Health

## 2016-06-07 ENCOUNTER — Ambulatory Visit (INDEPENDENT_AMBULATORY_CARE_PROVIDER_SITE_OTHER): Payer: Medicaid Other | Admitting: Adult Health

## 2016-06-07 VITALS — BP 148/74 | HR 70 | Ht 69.0 in | Wt 239.0 lb

## 2016-06-07 DIAGNOSIS — I10 Essential (primary) hypertension: Secondary | ICD-10-CM

## 2016-06-07 DIAGNOSIS — E784 Other hyperlipidemia: Secondary | ICD-10-CM

## 2016-06-07 DIAGNOSIS — I2510 Atherosclerotic heart disease of native coronary artery without angina pectoris: Secondary | ICD-10-CM

## 2016-06-07 DIAGNOSIS — E7849 Other hyperlipidemia: Secondary | ICD-10-CM

## 2016-06-07 LAB — BASIC METABOLIC PANEL
Anion gap: 6 (ref 5–15)
BUN: 11 mg/dL (ref 6–20)
CO2: 28 mmol/L (ref 22–32)
Calcium: 9.4 mg/dL (ref 8.9–10.3)
Chloride: 107 mmol/L (ref 101–111)
Creatinine, Ser: 1.34 mg/dL — ABNORMAL HIGH (ref 0.61–1.24)
GFR calc Af Amer: >60 mL/min (ref >60)
GFR calc non Af Amer: 59 mL/min — ABNORMAL LOW (ref >60)
Glucose, Bld: 101 mg/dL — ABNORMAL HIGH (ref 65–99)
Potassium: 3.6 mmol/L (ref 3.5–5.1)
Sodium: 141 mmol/L (ref 135–145)

## 2016-06-07 LAB — HEPATIC FUNCTION PANEL
ALT: 29 U/L (ref 17–63)
AST: 28 U/L (ref 15–41)
Albumin: 3.8 g/dL (ref 3.5–5.0)
Alkaline Phosphatase: 82 U/L (ref 38–126)
Bilirubin, Direct: <0.1 mg/dL — ABNORMAL LOW (ref 0.1–0.5)
Total Bilirubin: 0.3 mg/dL (ref 0.3–1.2)
Total Protein: 7 g/dL (ref 6.5–8.1)

## 2016-06-07 LAB — LIPID PANEL
Cholesterol: 122 mg/dL (ref 0–200)
HDL: 42 mg/dL (ref >40)
LDL Cholesterol: 29 mg/dL (ref 0–99)
Total CHOL/HDL Ratio: 2.9 RATIO
Triglycerides: 257 mg/dL — ABNORMAL HIGH (ref <150)
VLDL: 51 mg/dL — ABNORMAL HIGH (ref 0–40)

## 2016-06-07 MED ORDER — HYDRALAZINE HCL 100 MG PO TABS: 100.0000 mg | ORAL_TABLET | Freq: Three times a day (TID) | ORAL | 3 refills | Status: DC

## 2016-06-07 NOTE — Patient Instructions (Signed)
Your physician wants you to follow-up in: 6 Months with Dr. Bronson Ing.  You will receive a reminder letter in the mail two months in advance. If you don't receive a letter, please call our office to schedule the follow-up appointment.  Your physician has recommended you make the following change in your medication: Increase Hydralazine to 100 mg Three Times Daily   Your physician recommends that you return for lab work in: Today   If you need a refill on your cardiac medications before your next appointment, please call your pharmacy.  Thank you for choosing [facility]!

## 2016-06-07 NOTE — Progress Notes (Signed)
Name: [name]    DOB: 01/14/1966  Age: 51 y.o.  MR#: [id number]       PCP:  Alonza Bogus, MD      Insurance: Payor: MEDICAID [place] / Plan: MEDICAID [place] ACCESS / Product Type: *No Product type* /   CC:   No chief complaint on file.   VS Vitals:   06/07/16 1551  BP: (!) 150/74  Pulse: 70  SpO2: 99%  Weight: 239 lb (108.4 kg)  Height: $Remove'5\' 9"'YlYUBiz$  (1.753 m)    Weights Current Weight  06/07/16 239 lb (108.4 kg)  01/11/16 237 lb (107.5 kg)  12/10/15 235 lb (106.6 kg)    Blood Pressure  BP Readings from Last 3 Encounters:  06/07/16 (!) 150/74  01/11/16 169/80  12/10/15 (!) 178/86     Admit date:  (Not on file) Last encounter with RMR:  03/08/2016   Allergy Patient has no known allergies.  Current Outpatient Prescriptions  Medication Sig Dispense Refill  . acetaminophen (TYLENOL) 500 MG tablet Take 1 tablet (500 mg total) by mouth every 6 (six) hours as needed. 30 tablet 0  . amLODipine (NORVASC) 10 MG tablet Take 10 mg by mouth daily.      Marland Kitchen amoxicillin-clavulanate (AUGMENTIN) 875-125 MG tablet Take 1 tablet by mouth every 12 (twelve) hours. 20 tablet 0  . aspirin EC 81 MG tablet Take 81 mg by mouth daily.    . benzonatate (TESSALON) 100 MG capsule Take 2 capsules (200 mg total) by mouth 3 (three) times daily as needed for cough. 30 capsule 0  . CATAPRES-TTS-3 0.3 MG/24HR patch PLACE 1 PATCH ONTO SKIN ONCE A WEEK. REMOVE OLD PATCH. 4 patch 3  . fish oil-omega-3 fatty acids 1000 MG capsule Take 1 g by mouth 4 (four) times daily.      . haloperidol decanoate (HALDOL DECANOATE) 100 MG/ML injection Inject 100 mg into the muscle every 14 (fourteen) days.      Marland Kitchen Heating Pads ([employer] HEATING PAD) PADS 1 Units by Does not apply route 2 (two) times daily. 1 each 0  . hydrALAZINE (APRESOLINE) 50 MG tablet TAKE 1 & 1/2 TABLETS ($RemoveBefo'75MG'YlJkHmnjwve$ ) BY MOUTH THREE TIMES DAILY. 45 tablet 0  . hydrALAZINE (APRESOLINE) 50 MG tablet TAKE 1 & 1/2 TABLETS ($RemoveBefo'75MG'jScnrtcikcZ$ ) BY MOUTH THREE TIMES DAILY. 135 tablet 3   . insulin glargine (LANTUS) 100 UNIT/ML injection Inject 20 Units into the skin daily.     Marland Kitchen lithium carbonate 300 MG capsule Take 600 mg by mouth at bedtime.     . metoprolol tartrate (LOPRESSOR) 25 MG tablet TAKE (1/2) TABLET BY MOUTH DAILY. 45 tablet 3  . Multiple Vitamin (DAILY VITE PO) Take 1 tablet by mouth daily.    Marland Kitchen oxyCODONE-acetaminophen (PERCOCET/ROXICET) 5-325 MG per tablet Take 1 tablet by mouth every 6 (six) hours as needed for severe pain. 6 tablet 0  . potassium chloride SA (K-DUR,KLOR-CON) 20 MEQ tablet Take 20 mEq by mouth daily.      . pravastatin (PRAVACHOL) 40 MG tablet Take 40 mg by mouth daily.      . temazepam (RESTORIL) 15 MG capsule Take 15 mg by mouth at bedtime.      . hydrALAZINE (APRESOLINE) 50 MG tablet Take 1.5 tablets (75 mg total) by mouth 3 (three) times daily. 135 tablet 2   No current facility-administered medications for this visit.     Discontinued Meds:   There are no discontinued medications.  Patient Active Problem List   Diagnosis Date Noted  .  Acute renal failure ([place]) 12/17/2012  . Dehydration 12/17/2012  . Diarrhea 12/17/2012  . Gastroenteritis 12/17/2012  . GERD (gastroesophageal reflux disease) 12/17/2012  . Metabolic acidosis 73/42/8768  . Chronic kidney disease, stage 2, mildly decreased GFR 03/19/2012  . Arteriosclerotic cardiovascular disease (ASCVD)   . Schizophrenia ([place])   . Tobacco abuse   . Diabetes mellitus, type 2 ([place])   . Hypertension   . Hyperlipidemia   . Aortic insufficiency     LABS    Component Value Date/Time   NA 139 12/03/2014 1152   NA 141 06/11/2013 1137   NA 142 12/20/2012 0448   K 4.0 12/03/2014 1152   K 3.9 06/11/2013 1137   K 3.4 (L) 12/20/2012 0448   CL 108 12/03/2014 1152   CL 104 06/11/2013 1137   CL 112 12/20/2012 0448   CO2 23 12/03/2014 1152   CO2 24 06/11/2013 1137   CO2 22 12/20/2012 0448   GLUCOSE 79 12/03/2014 1152   GLUCOSE 87 06/11/2013 1137   GLUCOSE 111 (H) 12/20/2012 0448    BUN 12 12/03/2014 1152   BUN 24 (H) 06/11/2013 1137   BUN 26 (H) 12/20/2012 0448   CREATININE 1.13 12/03/2014 1152   CREATININE 1.92 (H) 06/11/2013 1137   CREATININE 1.77 (H) 12/20/2012 0448   CREATININE 2.33 (H) 12/19/2012 0448   CREATININE 1.33 04/11/2012 1345   CALCIUM 9.4 12/03/2014 1152   CALCIUM 10.1 06/11/2013 1137   CALCIUM 9.2 12/20/2012 0448   GFRNONAA 39 (L) 06/11/2013 1137   GFRNONAA 43 (L) 12/20/2012 0448   GFRNONAA 31 (L) 12/19/2012 0448   GFRAA 46 (L) 06/11/2013 1137   GFRAA 50 (L) 12/20/2012 0448   GFRAA 36 (L) 12/19/2012 0448   CMP     Component Value Date/Time   NA 139 12/03/2014 1152   K 4.0 12/03/2014 1152   CL 108 12/03/2014 1152   CO2 23 12/03/2014 1152   GLUCOSE 79 12/03/2014 1152   BUN 12 12/03/2014 1152   CREATININE 1.13 12/03/2014 1152   CALCIUM 9.4 12/03/2014 1152   PROT 7.7 06/11/2013 1137   ALBUMIN 3.7 06/11/2013 1137   AST 14 06/11/2013 1137   ALT 8 06/11/2013 1137   ALKPHOS 91 06/11/2013 1137   BILITOT 0.7 06/11/2013 1137   GFRNONAA 39 (L) 06/11/2013 1137   GFRAA 46 (L) 06/11/2013 1137       Component Value Date/Time   WBC 9.2 06/11/2013 1137   WBC 7.0 12/19/2012 0448   WBC 8.3 12/18/2012 0241   HGB 13.2 06/11/2013 1137   HGB 11.0 (L) 12/19/2012 0448   HGB 11.1 (L) 12/18/2012 0241   HCT 39.2 06/11/2013 1137   HCT 32.7 (L) 12/19/2012 0448   HCT 32.7 (L) 12/18/2012 0241   MCV 82.4 06/11/2013 1137   MCV 82.6 12/19/2012 0448   MCV 82.0 12/18/2012 0241    Lipid Panel     Component Value Date/Time   CHOL 106 (L) 12/03/2014 1152   TRIG 85 12/03/2014 1152   HDL 33 (L) 12/03/2014 1152   CHOLHDL 3.2 12/03/2014 1152   VLDL 17 12/03/2014 1152   LDLCALC 56 12/03/2014 1152    ABG No results found for: PHART, PCO2ART, PO2ART, HCO3, TCO2, ACIDBASEDEF, O2SAT   Lab Results  Component Value Date   TSH 2.268 04/11/2012   BNP (last 3 results) No results for input(s): BNP in the last 8760 hours.  ProBNP (last 3 results) No results  for input(s): PROBNP in the last 8760 hours.  Cardiac Panel (  last 3 results) No results for input(s): CKTOTAL, CKMB, TROPONINI, RELINDX in the last 72 hours.  Iron/TIBC/Ferritin/ %Sat No results found for: IRON, TIBC, FERRITIN, IRONPCTSAT   EKG Orders placed or performed in visit on 12/10/15  . EKG 12-Lead     Prior Assessment and Plan Problem List as of 06/07/2016 Reviewed: 12/10/2015  4:39 PM by Jory Sims, NP     Cardiovascular and Mediastinum   Hypertension   Last Assessment & Plan 09/06/2012 Office Visit Written 09/06/2012  2:08 PM by Yehuda Savannah, MD    Blood pressure control remains suboptimal. To exclude whitecoat hypertension, a 24-hour monitor will be provided and medication adjusted accordingly.      Aortic insufficiency   Last Assessment & Plan 09/06/2012 Office Visit Written 09/06/2012  2:04 PM by Yehuda Savannah, MD    Echocardiogram suggested aortic valve disease is clinically insignificant. Serial echocardiograms do not appear to be warranted.      Arteriosclerotic cardiovascular disease (ASCVD)   Last Assessment & Plan 09/06/2012 Office Visit Edited 09/07/2012 10:03 AM by Yehuda Savannah, MD    No segmental wall motion abnormality appreciated on echocardiography. With normal LV systolic function and single-vessel disease, prognosis should be excellent. We will continue to control cardiovascular risk factors to the extent possible.        Digestive   Gastroenteritis   GERD (gastroesophageal reflux disease)     Endocrine   Diabetes mellitus, type 2 [facility])   Last Assessment & Plan 03/19/2012 Office Visit Written 03/19/2012 12:22 PM by Yehuda Savannah, MD    Recent hemoglobin A1c level of 5.8 indicating mild diabetes in excellent control.        Genitourinary   Chronic kidney disease, stage 2, mildly decreased GFR   Last Assessment & Plan 09/06/2012 Office Visit Written 09/06/2012  2:06 PM by Yehuda Savannah, MD    Renal function was stable to improved when  last assessed in 04/2012. Continued monitoring at fairly long intervals is appropriate.      Acute renal failure [facility])     Other   Hyperlipidemia   Last Assessment & Plan 09/06/2012 Office Visit Written 09/06/2012  2:08 PM by Yehuda Savannah, MD    Lipid profile was excellent when assessed less than one year ago. Current therapy with pravastatin is adequate.      Schizophrenia [facility]   Tobacco abuse   Last Assessment & Plan 03/19/2012 Office Visit Written 03/19/2012 12:09 PM by Yehuda Savannah, MD    Patient claims very modest tobacco consumption.  He is encouraged to quit entirely.      Dehydration   Diarrhea   Metabolic acidosis       Imaging: No results found.

## 2016-06-07 NOTE — Progress Notes (Signed)
Cardiology Office Note   Date:  06/07/2016   ID:  [name], DOB 1965-11-11, MRN [id number]  PCP:  Alonza Bogus, MD  Cardiologist: [name], NP   Chief Complaint  Patient presents with  . Hypertension      History of Present Illness: [name] is a 51 y.o. male who presents for Ongoing assessment and management of hypertension, with other history to include nonobstructive CAD, history of CVA, and aortic insufficiency. The patient comes today on 6 month follow-up. On last office visit blood pressure was not well-controlled, and medications were adjusted to include increasing hydralazine to 75 mg 3 times a day.  He comes today without any complaints. The patient continues to eat salted foods, and group home where he lives. He is medically compliant as staff provides his medicines daily.  Past Medical History:  Diagnosis Date  . Aortic insufficiency    mild to moderate   . Arteriosclerotic cardiovascular disease (ASCVD)    EF 60% -- Non-Q MI in 07/2008->  BMS to mid LAD  . Axillary abscess 2011   right  . Cerebrovascular disease    CVA  . Chronic obstructive pulmonary disease ([place])   . Diabetes mellitus type II   . Diabetes mellitus, type 2 (HCC)    A1c of 5.6 in 05/2011  . Hyperlipidemia   . Hypertension   . Mild anemia    Result.  Nl H&H in 02/2011  . Obesity   . Schizophrenia ([place])    Schizophrenia/bipolar disease/mental retardation  . Sinus bradycardia    On low dose beta blocker  . Tobacco abuse    15 pack years; 0.5 pack per day    Past Surgical History:  Procedure Laterality Date  . HERNIA REPAIR  1967     Current Outpatient Prescriptions  Medication Sig Dispense Refill  . acetaminophen (TYLENOL) 500 MG tablet Take 1 tablet (500 mg total) by mouth every 6 (six) hours as needed. 30 tablet 0  . amLODipine (NORVASC) 10 MG tablet Take 10 mg by mouth daily.      Marland Kitchen amoxicillin-clavulanate (AUGMENTIN) 875-125 MG tablet Take 1  tablet by mouth every 12 (twelve) hours. 20 tablet 0  . aspirin EC 81 MG tablet Take 81 mg by mouth daily.    . benzonatate (TESSALON) 100 MG capsule Take 2 capsules (200 mg total) by mouth 3 (three) times daily as needed for cough. 30 capsule 0  . CATAPRES-TTS-3 0.3 MG/24HR patch PLACE 1 PATCH ONTO SKIN ONCE A WEEK. REMOVE OLD PATCH. 4 patch 3  . fish oil-omega-3 fatty acids 1000 MG capsule Take 1 g by mouth 4 (four) times daily.      . haloperidol decanoate (HALDOL DECANOATE) 100 MG/ML injection Inject 100 mg into the muscle every 14 (fourteen) days.      Marland Kitchen Heating Pads ([employer] HEATING PAD) PADS 1 Units by Does not apply route 2 (two) times daily. 1 each 0  . insulin glargine (LANTUS) 100 UNIT/ML injection Inject 20 Units into the skin daily.     Marland Kitchen lithium carbonate 300 MG capsule Take 600 mg by mouth at bedtime.     . metoprolol tartrate (LOPRESSOR) 25 MG tablet TAKE (1/2) TABLET BY MOUTH DAILY. 45 tablet 3  . Multiple Vitamin (DAILY VITE PO) Take 1 tablet by mouth daily.    Marland Kitchen oxyCODONE-acetaminophen (PERCOCET/ROXICET) 5-325 MG per tablet Take 1 tablet by mouth every 6 (six) hours as needed for severe pain. 6 tablet 0  .  potassium chloride SA (K-DUR,KLOR-CON) 20 MEQ tablet Take 20 mEq by mouth daily.      . pravastatin (PRAVACHOL) 40 MG tablet Take 40 mg by mouth daily.      . temazepam (RESTORIL) 15 MG capsule Take 15 mg by mouth at bedtime.      . hydrALAZINE (APRESOLINE) 100 MG tablet Take 1 tablet (100 mg total) by mouth 3 (three) times daily. 270 tablet 3   No current facility-administered medications for this visit.     Allergies:   Patient has no known allergies.    Social History:  The patient  reports that he has been smoking Cigarettes.  He started smoking about 41 years ago. He has a 15.00 pack-year smoking history. He has never used smokeless tobacco. He reports that he does not drink alcohol or use drugs.   Family History:  The patient's family history is not on file.    ROS:  All other systems are reviewed and negative. Unless otherwise mentioned in H&P    PHYSICAL EXAM: VS:  BP (!) 148/74   Pulse 70   Ht $R'5\' 9"'eu$  (1.753 m)   Wt 239 lb (108.4 kg)   SpO2 99%   BMI 35.29 kg/m  , BMI Body mass index is 35.29 kg/m. GEN: Well nourished, well developed, in no acute distress  HEENT: normal  Neck: no JVD, carotid bruits, or masses Cardiac: RRR; no murmurs, rubs, or gallops,no edema  Respiratory:  clear to auscultation bilaterally, normal work of breathing GI: soft, nontender, nondistended, + BS MS: no deformity or atrophy  Skin: warm and dry, no rash Neuro:  Strength and sensation are intact Psych: euthymic mood, full affect   Recent Labs: No results found for requested labs within last 8760 hours.    Lipid Panel    Component Value Date/Time   CHOL 106 (L) 12/03/2014 1152   TRIG 85 12/03/2014 1152   HDL 33 (L) 12/03/2014 1152   CHOLHDL 3.2 12/03/2014 1152   VLDL 17 12/03/2014 1152   LDLCALC 56 12/03/2014 1152      Wt Readings from Last 3 Encounters:  06/07/16 239 lb (108.4 kg)  01/11/16 237 lb (107.5 kg)  12/10/15 235 lb (106.6 kg)      Other studies Reviewed: Echocardiogram: 05/15/2012 Left ventricle: The cavity size was normal. Wall thickness was increased in a pattern of moderate LVH. Systolic function was normal. The estimated ejection fraction was in the range of 60% to 65%. Wall motion was normal; there were no regional wall motion abnormalities. Features are consistent with a pseudonormal left ventricular filling pattern, with concomitant abnormal relaxation and increased filling pressure (grade 2 diastolic dysfunction). - Aortic valve: Trileaflet; mildly thickened leaflets. Cusp separation was normal. Transvalvular velocity was minimally increased. There was no stenosis. Mild regurgitation. Overall no marked change compared to prior study 2010. Mean gradient: 17mm Hg (S). Regurgitation pressure  half-time: 572ms. - Mitral valve: Trivial regurgitation. - Left atrium: The atrium was mildly dilated. - Tricuspid valve: Trivial regurgitation. - Pulmonary arteries: Systolic pressure could not be accurately estimated. - Pericardium, extracardiac: There was no pericardial effusion.  ASSESSMENT AND PLAN:  1.  Hypertension: Blood pressures not well controlled currently. He continues to eat salty foods at group home where he resides. He remains on Catapres-TTS patch 0.3 mg once a week, will increase hydralazine to 100 mg 3 times a day. Follow-up BMET will be ordered. We'll see him again in 6 months unless symptomatic  2. CAD: Continue beta blocker, aspirin,  statin therapy,. No complaints of chest pain currently.  3.  Hypercholesterolemia: Continue pravastatin 40 mg daily. Check lipids and LFTs.    Current medicines are reviewed at length with the patient today.    Labs/ tests ordered today include: Orders Placed This Encounter  Procedures  . Basic Metabolic Panel (BMET)  . Lipid Profile  . Hepatic function panel     Disposition:   FU with 6 months  Signed, Jory Sims, NP  06/07/2016 5:58 PM    [facility] [address], [place], [place] [address] Phone: [phone]; Fax: [phone]

## 2016-06-07 NOTE — Progress Notes (Deleted)
Cardiology Office Note   Date:  06/07/2016   ID:  [name], DOB 1966-02-20, MRN [id number]  PCP:  Alonza Bogus, MD  Cardiologist: [name], NP   No chief complaint on file.     History of Present Illness: [name] is a 51 y.o. male who presents for ongoing assessment and management of coronary artery disease with history of bare-metal stent to the left anterior descending artery, in the setting of non-ST elevation MI in April 2010. Other history includes hypertension, hyperlipidemia, insulin-dependent diabetes, and history of tobacco abuse. He was last seen in the office on 12/10/2015 and was without complaints of recurrent chest pain dyspnea or uncontrolled hypertension. He unfortunately continued to smoke. The patient is being cared for in a group home.  On last office visit blood pressure was elevated, and I increased his hydralazine to 75 mg 3 times a day from 50 mg 3 times a day. He was advised on smoking cessation.    Past Medical History:  Diagnosis Date  . Aortic insufficiency    mild to moderate   . Arteriosclerotic cardiovascular disease (ASCVD)    EF 60% -- Non-Q MI in 07/2008->  BMS to mid LAD  . Axillary abscess 2011   right  . Cerebrovascular disease    CVA  . Chronic obstructive pulmonary disease ([name])   . Diabetes mellitus type II   . Diabetes mellitus, type 2 (HCC)    A1c of 5.6 in 05/2011  . Hyperlipidemia   . Hypertension   . Mild anemia    Result.  Nl H&H in 02/2011  . Obesity   . Schizophrenia ([name])    Schizophrenia/bipolar disease/mental retardation  . Sinus bradycardia    On low dose beta blocker  . Tobacco abuse    15 pack years; 0.5 pack per day    Past Surgical History:  Procedure Laterality Date  . HERNIA REPAIR  1967     Current Outpatient Prescriptions  Medication Sig Dispense Refill  . acetaminophen (TYLENOL) 500 MG tablet Take 1 tablet (500 mg total) by mouth every 6 (six) hours as needed. 30  tablet 0  . amLODipine (NORVASC) 10 MG tablet Take 10 mg by mouth daily.      Marland Kitchen amoxicillin-clavulanate (AUGMENTIN) 875-125 MG tablet Take 1 tablet by mouth every 12 (twelve) hours. 20 tablet 0  . aspirin EC 81 MG tablet Take 81 mg by mouth daily.    . benzonatate (TESSALON) 100 MG capsule Take 2 capsules (200 mg total) by mouth 3 (three) times daily as needed for cough. 30 capsule 0  . CATAPRES-TTS-3 0.3 MG/24HR patch PLACE 1 PATCH ONTO SKIN ONCE A WEEK. REMOVE OLD PATCH. 4 patch 3  . fish oil-omega-3 fatty acids 1000 MG capsule Take 1 g by mouth 4 (four) times daily.      . haloperidol decanoate (HALDOL DECANOATE) 100 MG/ML injection Inject 100 mg into the muscle every 14 (fourteen) days.      Marland Kitchen Heating Pads ([employer] HEATING PAD) PADS 1 Units by Does not apply route 2 (two) times daily. 1 each 0  . hydrALAZINE (APRESOLINE) 50 MG tablet Take 1.5 tablets (75 mg total) by mouth 3 (three) times daily. 135 tablet 2  . hydrALAZINE (APRESOLINE) 50 MG tablet TAKE 1 & 1/2 TABLETS ($RemoveBefo'75MG'mYoMrcosBEV$ ) BY MOUTH THREE TIMES DAILY. 45 tablet 0  . hydrALAZINE (APRESOLINE) 50 MG tablet TAKE 1 & 1/2 TABLETS ($RemoveBefo'75MG'jgsboNKsjOK$ ) BY MOUTH THREE TIMES DAILY. 135 tablet 3  .  insulin glargine (LANTUS) 100 UNIT/ML injection Inject 20 Units into the skin daily.     Marland Kitchen lithium carbonate 300 MG capsule Take 600 mg by mouth at bedtime.     . metoprolol tartrate (LOPRESSOR) 25 MG tablet TAKE (1/2) TABLET BY MOUTH DAILY. 45 tablet 3  . Multiple Vitamin (DAILY VITE PO) Take 1 tablet by mouth daily.    Marland Kitchen oxyCODONE-acetaminophen (PERCOCET/ROXICET) 5-325 MG per tablet Take 1 tablet by mouth every 6 (six) hours as needed for severe pain. 6 tablet 0  . potassium chloride SA (K-DUR,KLOR-CON) 20 MEQ tablet Take 20 mEq by mouth daily.      . pravastatin (PRAVACHOL) 40 MG tablet Take 40 mg by mouth daily.      . temazepam (RESTORIL) 15 MG capsule Take 15 mg by mouth at bedtime.       No current facility-administered medications for this visit.     Allergies:    Patient has no known allergies.    Social History:  The patient  reports that he has been smoking Cigarettes.  He started smoking about 41 years ago. He has a 15.00 pack-year smoking history. He has never used smokeless tobacco. He reports that he does not drink alcohol or use drugs.   Family History:  The patient's family history is not on file.    ROS: All other systems are reviewed and negative. Unless otherwise mentioned in H&P    PHYSICAL EXAM: VS:  There were no vitals taken for this visit. , BMI There is no height or weight on file to calculate BMI. GEN: Well nourished, well developed, in no acute distress HEENT: normal Neck: no JVD, carotid bruits, or masses Cardiac: ***RRR; no murmurs, rubs, or gallops,no edema  Respiratory:  clear to auscultation bilaterally, normal work of breathing GI: soft, nontender, nondistended, + BS MS: no deformity or atrophy Skin: warm and dry, no rash Neuro:  Strength and sensation are intact Psych: euthymic mood, full affect   EKG:  EKG {ACTION; IS/IS [id number]} ordered today. The ekg ordered today demonstrates ***   Recent Labs: No results found for requested labs within last 8760 hours.    Lipid Panel    Component Value Date/Time   CHOL 106 (L) 12/03/2014 1152   TRIG 85 12/03/2014 1152   HDL 33 (L) 12/03/2014 1152   CHOLHDL 3.2 12/03/2014 1152   VLDL 17 12/03/2014 1152   LDLCALC 56 12/03/2014 1152      Wt Readings from Last 3 Encounters:  01/11/16 237 lb (107.5 kg)  12/10/15 235 lb (106.6 kg)  05/06/15 228 lb (103.4 kg)      Other studies Reviewed: Additional studies/ records that were reviewed today include: ***. Review of the above records demonstrates: ***   ASSESSMENT AND PLAN:  1.  ***   Current medicines are reviewed at length with the patient today.    Labs/ tests ordered today include: *** No orders of the defined types were placed in this encounter.    Disposition:   FU with *** in {gen number  [id number]} {TIME; UNITS DAY/WEEK/MONTH:19136}   Signed, Jory Sims, NP  06/07/2016 6:39 AM    [facility] [address], [place], [place] [address] Phone: [phone]; Fax: [phone]

## 2016-06-08 ENCOUNTER — Telehealth: Payer: Self-pay | Admitting: *Deleted

## 2016-06-08 MED ORDER — POTASSIUM CHLORIDE ER 10 MEQ PO TBCR: 10.0000 meq | EXTENDED_RELEASE_TABLET | Freq: Every day | ORAL | 3 refills | Status: DC

## 2016-06-08 NOTE — Telephone Encounter (Signed)
Potassium order placed

## 2016-06-08 NOTE — Telephone Encounter (Signed)
-----   Message from Lendon Colonel, NP sent at 06/07/2016  6:07 PM EST ----- Labs reviewed. Will need low dose potassium replacement, 10 mEq daily. Awaiting Lipids

## 2016-06-09 ENCOUNTER — Telehealth: Payer: Self-pay | Admitting: *Deleted

## 2016-06-09 NOTE — Telephone Encounter (Signed)
[name] notified of lab results

## 2016-06-09 NOTE — Telephone Encounter (Signed)
-----   Message from Lendon Colonel, NP sent at 06/08/2016  7:51 PM EST ----- Triglycerides elevated. Will need to see PCP for management

## 2016-07-05 ENCOUNTER — Other Ambulatory Visit: Payer: Self-pay | Admitting: Cardiovascular Disease

## 2016-07-28 ENCOUNTER — Other Ambulatory Visit: Payer: Self-pay | Admitting: Cardiovascular Disease

## 2016-08-23 ENCOUNTER — Other Ambulatory Visit: Payer: Self-pay | Admitting: Cardiovascular Disease

## 2016-09-06 ENCOUNTER — Other Ambulatory Visit: Payer: Self-pay

## 2016-09-06 ENCOUNTER — Other Ambulatory Visit: Payer: Self-pay | Admitting: Adult Health

## 2016-09-06 MED ORDER — POTASSIUM CHLORIDE CRYS ER 20 MEQ PO TBCR: 20.0000 meq | EXTENDED_RELEASE_TABLET | Freq: Every day | ORAL | 3 refills | Status: DC

## 2016-09-06 NOTE — Telephone Encounter (Signed)
Clarified with Arnold Long NP, K+ dose is 20 meq daily,escribed new order

## 2016-11-17 ENCOUNTER — Other Ambulatory Visit: Payer: Self-pay | Admitting: Cardiovascular Disease

## 2016-12-15 ENCOUNTER — Encounter: Payer: Self-pay | Admitting: Cardiovascular Disease

## 2016-12-15 ENCOUNTER — Ambulatory Visit (INDEPENDENT_AMBULATORY_CARE_PROVIDER_SITE_OTHER): Payer: Medicaid Other | Admitting: Cardiovascular Disease

## 2016-12-15 VITALS — BP 180/82 | HR 76 | Ht 69.5 in | Wt 245.0 lb

## 2016-12-15 DIAGNOSIS — Z720 Tobacco use: Secondary | ICD-10-CM | POA: Diagnosis not present

## 2016-12-15 DIAGNOSIS — I25118 Atherosclerotic heart disease of native coronary artery with other forms of angina pectoris: Secondary | ICD-10-CM | POA: Diagnosis not present

## 2016-12-15 DIAGNOSIS — Z79899 Other long term (current) drug therapy: Secondary | ICD-10-CM | POA: Diagnosis not present

## 2016-12-15 DIAGNOSIS — Z955 Presence of coronary angioplasty implant and graft: Secondary | ICD-10-CM | POA: Diagnosis not present

## 2016-12-15 DIAGNOSIS — I351 Nonrheumatic aortic (valve) insufficiency: Secondary | ICD-10-CM

## 2016-12-15 DIAGNOSIS — I10 Essential (primary) hypertension: Secondary | ICD-10-CM

## 2016-12-15 DIAGNOSIS — E784 Other hyperlipidemia: Secondary | ICD-10-CM

## 2016-12-15 DIAGNOSIS — E7849 Other hyperlipidemia: Secondary | ICD-10-CM

## 2016-12-15 MED ORDER — LOSARTAN POTASSIUM 25 MG PO TABS: 25.0000 mg | ORAL_TABLET | Freq: Every day | ORAL | 3 refills | Status: DC

## 2016-12-15 NOTE — Progress Notes (Signed)
SUBJECTIVE: The patient presents for routine follow-up. He has a history of coronary artery disease having sustained a non-STEMI with a bare-metal stent to the LAD in April 2010. He also has essential hypertension, hyperlipidemia, insulin-dependent diabetes mellitus, and a history of tobacco abuse.   He has been seeing K. Lawrence DNP and I have not seen him since 2016.  He denies exertional chest pain and exertional dyspnea. He is on a clonidine patch. He says he has a dry mouth at about 2 AM.  Lipids 06/07/16: Total cholesterol 122, triglycerides 257, HDL 42, LDL 29.  Basic metabolic panel on 1/0/17 showed BUN 11, creatinine elevated at 1.34.   Review of Systems: As per "subjective", otherwise negative.  No Known Allergies  Current Outpatient Prescriptions  Medication Sig Dispense Refill  . acetaminophen (TYLENOL) 500 MG tablet Take 1 tablet (500 mg total) by mouth every 6 (six) hours as needed. 30 tablet 0  . amLODipine (NORVASC) 10 MG tablet Take 10 mg by mouth daily.      Marland Kitchen amoxicillin-clavulanate (AUGMENTIN) 875-125 MG tablet Take 1 tablet by mouth every 12 (twelve) hours. 20 tablet 0  . aspirin EC 81 MG tablet Take 81 mg by mouth daily.    . benzonatate (TESSALON) 100 MG capsule Take 2 capsules (200 mg total) by mouth 3 (three) times daily as needed for cough. 30 capsule 0  . CATAPRES-TTS-3 0.3 MG/24HR patch PLACE 1 PATCH ONTO SKIN ONCE A WEEK. REMOVE OLD PATCH. 4 patch 3  . fish oil-omega-3 fatty acids 1000 MG capsule Take 1 g by mouth 4 (four) times daily.      . haloperidol decanoate (HALDOL DECANOATE) 100 MG/ML injection Inject 100 mg into the muscle every 14 (fourteen) days.      Marland Kitchen Heating Pads ([employer] HEATING PAD) PADS 1 Units by Does not apply route 2 (two) times daily. 1 each 0  . hydrALAZINE (APRESOLINE) 100 MG tablet TAKE (1) TABLET BY MOUTH (3) TIMES DAILY. 90 tablet 6  . insulin glargine (LANTUS) 100 UNIT/ML injection Inject 20 Units into the skin daily.     Marland Kitchen  lithium carbonate 300 MG capsule Take 600 mg by mouth at bedtime.     . metoprolol tartrate (LOPRESSOR) 25 MG tablet TAKE (1/2) TABLET BY MOUTH DAILY. 30 tablet 6  . Multiple Vitamin (DAILY VITE PO) Take 1 tablet by mouth daily.    Marland Kitchen oxyCODONE-acetaminophen (PERCOCET/ROXICET) 5-325 MG per tablet Take 1 tablet by mouth every 6 (six) hours as needed for severe pain. 6 tablet 0  . pravastatin (PRAVACHOL) 40 MG tablet Take 40 mg by mouth daily.      . temazepam (RESTORIL) 15 MG capsule Take 15 mg by mouth at bedtime.      . potassium chloride SA (KLOR-CON M20) 20 MEQ tablet Take 1 tablet (20 mEq total) by mouth daily. 90 tablet 3   No current facility-administered medications for this visit.     Past Medical History:  Diagnosis Date  . Aortic insufficiency    mild to moderate   . Arteriosclerotic cardiovascular disease (ASCVD)    EF 60% -- Non-Q MI in 07/2008->  BMS to mid LAD  . Axillary abscess 2011   right  . Cerebrovascular disease    CVA  . Chronic obstructive pulmonary disease ([place])   . Diabetes mellitus type II   . Diabetes mellitus, type 2 (HCC)    A1c of 5.6 in 05/2011  . Hyperlipidemia   . Hypertension   .  Mild anemia    Result.  Nl H&H in 02/2011  . Obesity   . Schizophrenia ([place])    Schizophrenia/bipolar disease/mental retardation  . Sinus bradycardia    On low dose beta blocker  . Tobacco abuse    15 pack years; 0.5 pack per day    Past Surgical History:  Procedure Laterality Date  . HERNIA REPAIR  1967    Social History   Social History  . Marital status: Single    Spouse name: N/A  . Number of children: N/A  . Years of education: N/A   Occupational History  . Disabled    Social History Main Topics  . Smoking status: Current Some Day Smoker    Packs/day: 0.50    Years: 30.00    Types: Cigarettes    Start date: 12/03/1974  . Smokeless tobacco: Never Used     Comment: Patient is not motivated to quit.  He attributes his smoking to the lack of other  activities.  . Alcohol use No  . Drug use: No  . Sexual activity: No   Other Topics Concern  . Not on file   Social History Narrative   Resides in an assisted-living facility     Vitals:   12/15/16 1323  BP: (!) 180/82  Pulse: 76  SpO2: 93%  Weight: 245 lb (111.1 kg)  Height: 5' 9.5" (1.765 m)    Wt Readings from Last 3 Encounters:  12/15/16 245 lb (111.1 kg)  06/07/16 239 lb (108.4 kg)  01/11/16 237 lb (107.5 kg)     PHYSICAL EXAM General: NAD HEENT: Normal. Neck: No JVD, no thyromegaly. Lungs: Clear to auscultation bilaterally with normal respiratory effort. CV: Nondisplaced PMI.  Regular rate and rhythm, normal S1/S2, no D9/I3, II/VI systolic murmur over RUSB. No pretibial or periankle edema.  No carotid bruit.   Abdomen: Soft, nontender, no distention.  Neurologic: Alert and oriented.  Psych: Normal affect. Skin: Normal. Musculoskeletal: No gross deformities.    ECG: Most recent ECG reviewed.   Labs: Lab Results  Component Value Date/Time   K 3.6 06/07/2016 04:40 PM   BUN 11 06/07/2016 04:40 PM   CREATININE 1.34 (H) 06/07/2016 04:40 PM   CREATININE 1.13 12/03/2014 11:52 AM   ALT 29 06/07/2016 04:40 PM   TSH 2.268 04/11/2012 01:45 PM   HGB 13.2 06/11/2013 11:37 AM     Lipids: Lab Results  Component Value Date/Time   LDLCALC 29 06/07/2016 04:40 PM   CHOL 122 06/07/2016 04:40 PM   TRIG 257 (H) 06/07/2016 04:40 PM   HDL 42 06/07/2016 04:40 PM       ASSESSMENT AND PLAN: 1. CAD: Stable ischemic heart disease. Continue aspirin, pravastatin, and metoprolol.  2. Essential HTN: Elevated on amlodipine 10 mg, clonidine patch 0.3 mg, and hydralazine 100 mg 3 times daily. I will start losartan 25 mg daily and check renal function with a basic metabolic panel within the next several days.  3. Hyperlipidemia: Lipids reviewed above. Continue pravastatin.  4. Aortic regurgitation: Mild in February 2014. Stable.      Disposition: Follow up 1  yr.   Kate Sable, M.D., F.A.C.C.

## 2016-12-15 NOTE — Patient Instructions (Signed)
Your physician wants you to follow-up in:  6 months with Dr Virgina Jock will receive a reminder letter in the mail two months in advance. If you don't receive a letter, please call our office to schedule the follow-up appointment.    START Losartan 25 mg daily    Get lab work in 5 days ( Sept 18 th )    Continue all other medications     No test ordered today      Thank you for choosing [facility] !

## 2016-12-20 ENCOUNTER — Other Ambulatory Visit: Payer: Self-pay | Admitting: Cardiovascular Disease

## 2016-12-21 ENCOUNTER — Telehealth: Payer: Self-pay

## 2016-12-21 DIAGNOSIS — Z79899 Other long term (current) drug therapy: Secondary | ICD-10-CM

## 2016-12-21 LAB — BASIC METABOLIC PANEL
BUN/Creatinine Ratio: 9 (ref 9–20)
BUN: 14 mg/dL (ref 6–24)
CO2: 24 mmol/L (ref 20–29)
Calcium: 9.3 mg/dL (ref 8.7–10.2)
Chloride: 105 mmol/L (ref 96–106)
Creatinine, Ser: 1.63 mg/dL — ABNORMAL HIGH (ref 0.76–1.27)
GFR calc Af Amer: 55 mL/min/{1.73_m2} — ABNORMAL LOW (ref >59)
GFR calc non Af Amer: 47 mL/min/{1.73_m2} — ABNORMAL LOW (ref >59)
Glucose: 130 mg/dL — ABNORMAL HIGH (ref 65–99)
Potassium: 4.1 mmol/L (ref 3.5–5.2)
Sodium: 142 mmol/L (ref 134–144)

## 2016-12-21 NOTE — Telephone Encounter (Signed)
Left message with caregiver @ group home. I will mail lab slip.

## 2016-12-21 NOTE — Telephone Encounter (Signed)
-----   Message from Laurine Blazer, LPN sent at 05/01/7865  1:12 PM EDT -----   ----- Message ----- From: Massie Maroon, CMA Sent: 12/21/2016   1:03 PM To: Laurine Blazer, LPN    ----- Message ----- From: Herminio Commons, MD Sent: 12/21/2016  12:41 PM To: Staci T Ashworth, CMA  Renal function has slightly worsened when compared to March. Please repeat BMET in one month.

## 2017-01-09 ENCOUNTER — Other Ambulatory Visit: Payer: Self-pay | Admitting: Cardiovascular Disease

## 2017-01-09 MED ORDER — LOSARTAN POTASSIUM 25 MG PO TABS: 25.0000 mg | ORAL_TABLET | Freq: Every day | ORAL | 11 refills | Status: DC

## 2017-01-09 NOTE — Telephone Encounter (Signed)
[facility] is now pt's pharmacy, they requested MD's last office visit to be faxed

## 2017-01-09 NOTE — Telephone Encounter (Signed)
Vicente Males calling from [facility] in regards to the pt's losartan (COZAAR) 25 MG tablet [[id number]]  --it was not sent to the right pharmacy and she has some questions [phone]

## 2017-02-15 LAB — BASIC METABOLIC PANEL
BUN/Creatinine Ratio: 11 (calc) (ref 6–22)
BUN: 21 mg/dL (ref 7–25)
CO2: 29 mmol/L (ref 20–32)
Calcium: 8.9 mg/dL (ref 8.6–10.3)
Chloride: 106 mmol/L (ref 98–110)
Creat: 1.93 mg/dL — ABNORMAL HIGH (ref 0.70–1.33)
Glucose, Bld: 83 mg/dL (ref 65–139)
Potassium: 4.6 mmol/L (ref 3.5–5.3)
Sodium: 141 mmol/L (ref 135–146)

## 2017-02-16 ENCOUNTER — Telehealth: Payer: Self-pay

## 2017-02-16 DIAGNOSIS — N289 Disorder of kidney and ureter, unspecified: Secondary | ICD-10-CM

## 2017-02-16 NOTE — Telephone Encounter (Signed)
Called pt. No answer. Unable to leave message.

## 2017-03-30 ENCOUNTER — Ambulatory Visit (HOSPITAL_COMMUNITY)
Admission: RE | Admit: 2017-03-30 | Discharge: 2017-03-30 | Disposition: A | Discharge status: Home / Self Care | Payer: Medicaid Other | Source: Ambulatory Visit | Attending: Pulmonary Disease | Admitting: Pulmonary Disease

## 2017-03-30 ENCOUNTER — Other Ambulatory Visit (HOSPITAL_COMMUNITY): Payer: Self-pay | Admitting: Pulmonary Disease

## 2017-03-30 DIAGNOSIS — M25552 Pain in left hip: Secondary | ICD-10-CM

## 2017-03-30 DIAGNOSIS — M25562 Pain in left knee: Secondary | ICD-10-CM | POA: Diagnosis present

## 2017-03-30 DIAGNOSIS — M1612 Unilateral primary osteoarthritis, left hip: Secondary | ICD-10-CM | POA: Diagnosis not present

## 2017-03-30 DIAGNOSIS — M1712 Unilateral primary osteoarthritis, left knee: Secondary | ICD-10-CM | POA: Insufficient documentation

## 2017-05-15 ENCOUNTER — Other Ambulatory Visit: Payer: Self-pay | Admitting: Adult Health

## 2017-05-15 MED ORDER — POTASSIUM CHLORIDE CRYS ER 20 MEQ PO TBCR: 20.0000 meq | EXTENDED_RELEASE_TABLET | Freq: Every day | ORAL | 3 refills | Status: DC

## 2017-06-14 ENCOUNTER — Other Ambulatory Visit (HOSPITAL_COMMUNITY): Payer: Self-pay | Admitting: Nephrology

## 2017-06-14 DIAGNOSIS — N183 Chronic kidney disease, stage 3 unspecified: Secondary | ICD-10-CM

## 2017-06-22 ENCOUNTER — Ambulatory Visit (HOSPITAL_COMMUNITY): Payer: Medicaid Other

## 2017-06-22 ENCOUNTER — Encounter (HOSPITAL_COMMUNITY): Payer: Self-pay

## 2017-07-03 ENCOUNTER — Ambulatory Visit (HOSPITAL_COMMUNITY)
Admission: RE | Admit: 2017-07-03 | Discharge: 2017-07-03 | Disposition: A | Discharge status: Home / Self Care | Payer: Medicaid Other | Source: Ambulatory Visit | Attending: Nephrology | Admitting: Nephrology

## 2017-07-03 DIAGNOSIS — N183 Chronic kidney disease, stage 3 unspecified: Secondary | ICD-10-CM

## 2017-08-08 ENCOUNTER — Other Ambulatory Visit: Payer: Self-pay | Admitting: Cardiovascular Disease

## 2017-08-18 ENCOUNTER — Other Ambulatory Visit: Payer: Self-pay | Admitting: Cardiovascular Disease

## 2017-09-06 ENCOUNTER — Other Ambulatory Visit: Payer: Self-pay | Admitting: Cardiovascular Disease

## 2017-10-06 ENCOUNTER — Other Ambulatory Visit: Payer: Self-pay | Admitting: Cardiovascular Disease

## 2017-11-16 ENCOUNTER — Other Ambulatory Visit: Payer: Self-pay | Admitting: Cardiovascular Disease

## 2017-12-06 ENCOUNTER — Other Ambulatory Visit: Payer: Self-pay | Admitting: Cardiovascular Disease

## 2017-12-26 ENCOUNTER — Other Ambulatory Visit (HOSPITAL_COMMUNITY)
Admission: RE | Admit: 2017-12-26 | Discharge: 2017-12-26 | Disposition: A | Discharge status: Home / Self Care | Payer: Medicaid Other | Source: Ambulatory Visit | Attending: Pulmonary Disease | Admitting: Pulmonary Disease

## 2017-12-26 ENCOUNTER — Ambulatory Visit (HOSPITAL_COMMUNITY)
Admission: RE | Admit: 2017-12-26 | Discharge: 2017-12-26 | Disposition: A | Discharge status: Home / Self Care | Payer: Medicaid Other | Source: Ambulatory Visit | Attending: Pulmonary Disease | Admitting: Pulmonary Disease

## 2017-12-26 ENCOUNTER — Other Ambulatory Visit (HOSPITAL_COMMUNITY): Payer: Self-pay | Admitting: Pulmonary Disease

## 2017-12-26 DIAGNOSIS — J449 Chronic obstructive pulmonary disease, unspecified: Secondary | ICD-10-CM | POA: Diagnosis not present

## 2017-12-26 DIAGNOSIS — E785 Hyperlipidemia, unspecified: Secondary | ICD-10-CM | POA: Diagnosis not present

## 2017-12-26 DIAGNOSIS — E119 Type 2 diabetes mellitus without complications: Secondary | ICD-10-CM | POA: Insufficient documentation

## 2017-12-26 DIAGNOSIS — R05 Cough: Secondary | ICD-10-CM | POA: Diagnosis not present

## 2017-12-26 DIAGNOSIS — I2510 Atherosclerotic heart disease of native coronary artery without angina pectoris: Secondary | ICD-10-CM | POA: Insufficient documentation

## 2017-12-26 DIAGNOSIS — R059 Cough, unspecified: Secondary | ICD-10-CM

## 2017-12-26 LAB — COMPREHENSIVE METABOLIC PANEL
ALT: 14 U/L (ref 0–44)
AST: 19 U/L (ref 15–41)
Albumin: 3.6 g/dL (ref 3.5–5.0)
Alkaline Phosphatase: 75 U/L (ref 38–126)
Anion gap: 9 (ref 5–15)
BUN: 16 mg/dL (ref 6–20)
CO2: 23 mmol/L (ref 22–32)
Calcium: 9 mg/dL (ref 8.9–10.3)
Chloride: 110 mmol/L (ref 98–111)
Creatinine, Ser: 1.98 mg/dL — ABNORMAL HIGH (ref 0.61–1.24)
GFR calc Af Amer: 42 mL/min — ABNORMAL LOW (ref >60)
GFR calc non Af Amer: 37 mL/min — ABNORMAL LOW (ref >60)
Glucose, Bld: 90 mg/dL (ref 70–99)
Potassium: 3.9 mmol/L (ref 3.5–5.1)
Sodium: 142 mmol/L (ref 135–145)
Total Bilirubin: 0.6 mg/dL (ref 0.3–1.2)
Total Protein: 7.3 g/dL (ref 6.5–8.1)

## 2017-12-26 LAB — HEMOGLOBIN A1C
Hgb A1c MFr Bld: 6.1 % — ABNORMAL HIGH (ref 4.8–5.6)
Mean Plasma Glucose: 128.37 mg/dL

## 2017-12-26 LAB — LIPID PANEL
Cholesterol: 136 mg/dL (ref 0–200)
HDL: 31 mg/dL — ABNORMAL LOW (ref >40)
LDL Cholesterol: 78 mg/dL (ref 0–99)
Total CHOL/HDL Ratio: 4.4 RATIO
Triglycerides: 136 mg/dL (ref <150)
VLDL: 27 mg/dL (ref 0–40)

## 2018-01-04 ENCOUNTER — Other Ambulatory Visit: Payer: Self-pay | Admitting: Cardiovascular Disease

## 2018-01-08 ENCOUNTER — Ambulatory Visit: Payer: Medicaid Other | Admitting: Cardiovascular Disease

## 2018-01-31 ENCOUNTER — Other Ambulatory Visit: Payer: Self-pay | Admitting: Cardiovascular Disease

## 2018-02-06 ENCOUNTER — Encounter (HOSPITAL_COMMUNITY): Payer: Self-pay | Admitting: Emergency Medicine

## 2018-02-06 ENCOUNTER — Inpatient Hospital Stay (HOSPITAL_COMMUNITY)
Admission: EM | Admit: 2018-02-06 | Discharge: 2018-02-08 | DRG: 684 | Disposition: A | Discharge status: Home / Self Care | Payer: Medicaid Other | Attending: Family Medicine | Admitting: Family Medicine

## 2018-02-06 ENCOUNTER — Other Ambulatory Visit: Payer: Self-pay

## 2018-02-06 ENCOUNTER — Other Ambulatory Visit: Payer: Self-pay | Admitting: Cardiovascular Disease

## 2018-02-06 ENCOUNTER — Emergency Department (HOSPITAL_COMMUNITY): Payer: Medicaid Other

## 2018-02-06 DIAGNOSIS — E785 Hyperlipidemia, unspecified: Secondary | ICD-10-CM | POA: Diagnosis present

## 2018-02-06 DIAGNOSIS — E1122 Type 2 diabetes mellitus with diabetic chronic kidney disease: Secondary | ICD-10-CM | POA: Diagnosis present

## 2018-02-06 DIAGNOSIS — I2510 Atherosclerotic heart disease of native coronary artery without angina pectoris: Secondary | ICD-10-CM | POA: Diagnosis present

## 2018-02-06 DIAGNOSIS — Z794 Long term (current) use of insulin: Secondary | ICD-10-CM

## 2018-02-06 DIAGNOSIS — F209 Schizophrenia, unspecified: Secondary | ICD-10-CM | POA: Diagnosis present

## 2018-02-06 DIAGNOSIS — E119 Type 2 diabetes mellitus without complications: Secondary | ICD-10-CM

## 2018-02-06 DIAGNOSIS — K219 Gastro-esophageal reflux disease without esophagitis: Secondary | ICD-10-CM | POA: Diagnosis present

## 2018-02-06 DIAGNOSIS — I351 Nonrheumatic aortic (valve) insufficiency: Secondary | ICD-10-CM | POA: Diagnosis present

## 2018-02-06 DIAGNOSIS — I10 Essential (primary) hypertension: Secondary | ICD-10-CM | POA: Diagnosis present

## 2018-02-06 DIAGNOSIS — I679 Cerebrovascular disease, unspecified: Secondary | ICD-10-CM | POA: Diagnosis present

## 2018-02-06 DIAGNOSIS — N183 Chronic kidney disease, stage 3 (moderate): Secondary | ICD-10-CM | POA: Diagnosis present

## 2018-02-06 DIAGNOSIS — J449 Chronic obstructive pulmonary disease, unspecified: Secondary | ICD-10-CM | POA: Diagnosis present

## 2018-02-06 DIAGNOSIS — F79 Unspecified intellectual disabilities: Secondary | ICD-10-CM | POA: Diagnosis present

## 2018-02-06 DIAGNOSIS — I129 Hypertensive chronic kidney disease with stage 1 through stage 4 chronic kidney disease, or unspecified chronic kidney disease: Secondary | ICD-10-CM | POA: Diagnosis present

## 2018-02-06 DIAGNOSIS — R197 Diarrhea, unspecified: Secondary | ICD-10-CM | POA: Diagnosis present

## 2018-02-06 DIAGNOSIS — N189 Chronic kidney disease, unspecified: Secondary | ICD-10-CM | POA: Diagnosis present

## 2018-02-06 DIAGNOSIS — F17210 Nicotine dependence, cigarettes, uncomplicated: Secondary | ICD-10-CM | POA: Diagnosis present

## 2018-02-06 DIAGNOSIS — Z7982 Long term (current) use of aspirin: Secondary | ICD-10-CM | POA: Diagnosis not present

## 2018-02-06 DIAGNOSIS — N179 Acute kidney failure, unspecified: Secondary | ICD-10-CM | POA: Diagnosis present

## 2018-02-06 LAB — CBC WITH DIFFERENTIAL/PLATELET
Abs Immature Granulocytes: 0.05 10*3/uL (ref 0.00–0.07)
Basophils Absolute: 0 10*3/uL (ref 0.0–0.1)
Basophils Relative: 0 %
Eosinophils Absolute: 0.2 10*3/uL (ref 0.0–0.5)
Eosinophils Relative: 3 %
HCT: 38.7 % — ABNORMAL LOW (ref 39.0–52.0)
Hemoglobin: 12 g/dL — ABNORMAL LOW (ref 13.0–17.0)
Immature Granulocytes: 1 %
Lymphocytes Relative: 19 %
Lymphs Abs: 1.3 10*3/uL (ref 0.7–4.0)
MCH: 26.7 pg (ref 26.0–34.0)
MCHC: 31 g/dL (ref 30.0–36.0)
MCV: 86 fL (ref 80.0–100.0)
Monocytes Absolute: 0.9 10*3/uL (ref 0.1–1.0)
Monocytes Relative: 14 %
Neutro Abs: 4.2 10*3/uL (ref 1.7–7.7)
Neutrophils Relative %: 63 %
Platelets: 199 10*3/uL (ref 150–400)
RBC: 4.5 MIL/uL (ref 4.22–5.81)
RDW: 15.7 % — ABNORMAL HIGH (ref 11.5–15.5)
WBC: 6.6 10*3/uL (ref 4.0–10.5)
nRBC: 0 % (ref 0.0–0.2)

## 2018-02-06 LAB — COMPREHENSIVE METABOLIC PANEL
ALT: 23 U/L (ref 0–44)
AST: 13 U/L — ABNORMAL LOW (ref 15–41)
Albumin: 3.2 g/dL — ABNORMAL LOW (ref 3.5–5.0)
Alkaline Phosphatase: 67 U/L (ref 38–126)
Anion gap: 10 (ref 5–15)
BUN: 35 mg/dL — ABNORMAL HIGH (ref 6–20)
CO2: 20 mmol/L — ABNORMAL LOW (ref 22–32)
Calcium: 8.8 mg/dL — ABNORMAL LOW (ref 8.9–10.3)
Chloride: 109 mmol/L (ref 98–111)
Creatinine, Ser: 3.4 mg/dL — ABNORMAL HIGH (ref 0.61–1.24)
GFR calc Af Amer: 22 mL/min — ABNORMAL LOW (ref >60)
GFR calc non Af Amer: 19 mL/min — ABNORMAL LOW (ref >60)
Glucose, Bld: 97 mg/dL (ref 70–99)
Potassium: 3.6 mmol/L (ref 3.5–5.1)
Sodium: 139 mmol/L (ref 135–145)
Total Bilirubin: 0.8 mg/dL (ref 0.3–1.2)
Total Protein: 6.9 g/dL (ref 6.5–8.1)

## 2018-02-06 LAB — URINALYSIS, ROUTINE W REFLEX MICROSCOPIC
Bacteria, UA: NONE SEEN
Bilirubin Urine: NEGATIVE
Glucose, UA: NEGATIVE mg/dL
Hgb urine dipstick: NEGATIVE
Ketones, ur: NEGATIVE mg/dL
Leukocytes, UA: NEGATIVE
Nitrite: NEGATIVE
Protein, ur: 100 mg/dL — AB
Specific Gravity, Urine: 1.015 (ref 1.005–1.030)
pH: 5 (ref 5.0–8.0)

## 2018-02-06 LAB — C DIFFICILE QUICK SCREEN W PCR REFLEX
C Diff antigen: NEGATIVE
C Diff interpretation: NOT DETECTED
C Diff toxin: NEGATIVE

## 2018-02-06 LAB — I-STAT CHEM 8, ED
BUN: 34 mg/dL — ABNORMAL HIGH (ref 6–20)
Calcium, Ion: 1.25 mmol/L (ref 1.15–1.40)
Chloride: 110 mmol/L (ref 98–111)
Creatinine, Ser: 3.6 mg/dL — ABNORMAL HIGH (ref 0.61–1.24)
Glucose, Bld: 86 mg/dL (ref 70–99)
HCT: 34 % — ABNORMAL LOW (ref 39.0–52.0)
Hemoglobin: 11.6 g/dL — ABNORMAL LOW (ref 13.0–17.0)
Potassium: 3.7 mmol/L (ref 3.5–5.1)
Sodium: 142 mmol/L (ref 135–145)
TCO2: 21 mmol/L — ABNORMAL LOW (ref 22–32)

## 2018-02-06 LAB — SODIUM, URINE, RANDOM: Sodium, Ur: <10 mmol/L

## 2018-02-06 LAB — CREATININE, URINE, RANDOM: Creatinine, Urine: 274.11 mg/dL

## 2018-02-06 LAB — MAGNESIUM: Magnesium: 1.8 mg/dL (ref 1.7–2.4)

## 2018-02-06 LAB — LIPASE, BLOOD: Lipase: 24 U/L (ref 11–51)

## 2018-02-06 LAB — GLUCOSE, CAPILLARY: Glucose-Capillary: 85 mg/dL (ref 70–99)

## 2018-02-06 MED ORDER — TRAZODONE HCL 50 MG PO TABS
50.0000 mg | ORAL_TABLET | Freq: Every day | ORAL | Status: DC
  Administered 2018-02-06 – 2018-02-07 (×2): 50 mg via ORAL
  Filled 2018-02-06 (×2): qty 1

## 2018-02-06 MED ORDER — ACETAMINOPHEN 500 MG PO TABS: 500.0000 mg | ORAL_TABLET | Freq: Four times a day (QID) | ORAL | Status: DC | PRN

## 2018-02-06 MED ORDER — ONDANSETRON HCL 4 MG PO TABS: 4.0000 mg | ORAL_TABLET | Freq: Four times a day (QID) | ORAL | Status: DC | PRN

## 2018-02-06 MED ORDER — ACETAMINOPHEN 325 MG PO TABS: 650.0000 mg | ORAL_TABLET | Freq: Four times a day (QID) | ORAL | Status: DC | PRN

## 2018-02-06 MED ORDER — HYDRALAZINE HCL 25 MG PO TABS
100.0000 mg | ORAL_TABLET | Freq: Three times a day (TID) | ORAL | Status: DC
  Administered 2018-02-06 – 2018-02-08 (×5): 100 mg via ORAL
  Filled 2018-02-06 (×5): qty 4

## 2018-02-06 MED ORDER — METRONIDAZOLE IN NACL 5-0.79 MG/ML-% IV SOLN
500.0000 mg | Freq: Three times a day (TID) | INTRAVENOUS | Status: DC
  Administered 2018-02-06 – 2018-02-07 (×3): 500 mg via INTRAVENOUS
  Filled 2018-02-06 (×3): qty 100

## 2018-02-06 MED ORDER — CLOBETASOL PROPIONATE 0.05 % EX CREA
1.0000 "application " | TOPICAL_CREAM | Freq: Every morning | CUTANEOUS | Status: DC
  Filled 2018-02-06: qty 15

## 2018-02-06 MED ORDER — ASPIRIN EC 81 MG PO TBEC
81.0000 mg | DELAYED_RELEASE_TABLET | Freq: Every morning | ORAL | Status: DC
  Administered 2018-02-07 – 2018-02-08 (×2): 81 mg via ORAL
  Filled 2018-02-06 (×2): qty 1

## 2018-02-06 MED ORDER — SODIUM CHLORIDE 0.9 % IV SOLN
INTRAVENOUS | Status: DC
  Administered 2018-02-06 – 2018-02-08 (×5): via INTRAVENOUS

## 2018-02-06 MED ORDER — METOPROLOL TARTRATE 25 MG PO TABS
12.5000 mg | ORAL_TABLET | Freq: Every morning | ORAL | Status: DC
  Administered 2018-02-07: 12.5 mg via ORAL
  Filled 2018-02-06: qty 1

## 2018-02-06 MED ORDER — SODIUM CHLORIDE 0.9 % IV SOLN
2.0000 g | INTRAVENOUS | Status: DC
  Administered 2018-02-06: 2 g via INTRAVENOUS
  Filled 2018-02-06 (×2): qty 20

## 2018-02-06 MED ORDER — AMLODIPINE BESYLATE 5 MG PO TABS
10.0000 mg | ORAL_TABLET | Freq: Every morning | ORAL | Status: DC
  Administered 2018-02-07 – 2018-02-08 (×2): 10 mg via ORAL
  Filled 2018-02-06 (×2): qty 2

## 2018-02-06 MED ORDER — LOPERAMIDE HCL 2 MG PO CAPS
4.0000 mg | ORAL_CAPSULE | Freq: Once | ORAL | Status: AC
  Administered 2018-02-06: 4 mg via ORAL
  Filled 2018-02-06: qty 2

## 2018-02-06 MED ORDER — INSULIN ASPART 100 UNIT/ML ~~LOC~~ SOLN: 0.0000 [IU] | Freq: Three times a day (TID) | SUBCUTANEOUS | Status: DC

## 2018-02-06 MED ORDER — ONDANSETRON HCL 4 MG/2ML IJ SOLN: 4.0000 mg | Freq: Four times a day (QID) | INTRAMUSCULAR | Status: DC | PRN

## 2018-02-06 MED ORDER — LACTATED RINGERS IV BOLUS
1000.0000 mL | Freq: Once | INTRAVENOUS | Status: AC
  Administered 2018-02-06: 1000 mL via INTRAVENOUS

## 2018-02-06 MED ORDER — SODIUM CHLORIDE 0.9 % IV SOLN: 1.0000 g | INTRAVENOUS | Status: DC

## 2018-02-06 MED ORDER — CLONIDINE HCL 0.3 MG/24HR TD PTWK
0.3000 mg | MEDICATED_PATCH | TRANSDERMAL | Status: DC
  Administered 2018-02-08: 0.3 mg via TRANSDERMAL
  Filled 2018-02-06: qty 1

## 2018-02-06 MED ORDER — LOPERAMIDE HCL 2 MG PO CAPS: 2.0000 mg | ORAL_CAPSULE | Freq: Four times a day (QID) | ORAL | Status: DC | PRN

## 2018-02-06 MED ORDER — ONDANSETRON HCL 4 MG/2ML IJ SOLN
4.0000 mg | Freq: Once | INTRAMUSCULAR | Status: AC
  Administered 2018-02-06: 4 mg via INTRAVENOUS
  Filled 2018-02-06: qty 2

## 2018-02-06 MED ORDER — CIPROFLOXACIN IN D5W 400 MG/200ML IV SOLN
400.0000 mg | Freq: Once | INTRAVENOUS | Status: AC
  Administered 2018-02-06: 400 mg via INTRAVENOUS
  Filled 2018-02-06: qty 200

## 2018-02-06 MED ORDER — OMEGA-3-ACID ETHYL ESTERS 1 G PO CAPS
1.0000 g | ORAL_CAPSULE | Freq: Four times a day (QID) | ORAL | Status: DC
  Administered 2018-02-06 – 2018-02-08 (×6): 1 g via ORAL
  Filled 2018-02-06 (×6): qty 1

## 2018-02-06 MED ORDER — HEPARIN SODIUM (PORCINE) 5000 UNIT/ML IJ SOLN
5000.0000 [IU] | Freq: Three times a day (TID) | INTRAMUSCULAR | Status: DC
  Administered 2018-02-06 – 2018-02-08 (×5): 5000 [IU] via SUBCUTANEOUS
  Filled 2018-02-06 (×5): qty 1

## 2018-02-06 MED ORDER — ACETAMINOPHEN 650 MG RE SUPP: 650.0000 mg | Freq: Four times a day (QID) | RECTAL | Status: DC | PRN

## 2018-02-06 NOTE — H&P (Signed)
History and Physical    [name] DGU:[id number] DOB: October 22, 1965 DOA: 02/06/2018  PCP: Sinda Du, MD  Patient coming from: Home  I have personally briefly reviewed patient's old medical records in [place]  Chief Complaint: Diarrhea  HPI: [name] is a 52 y.o. male with medical history significant of schizophrenia, DM2, HTN, CKD stage 3.  Patient presents to the ED with c/o generalized weakness that has been progressively worsening over past few days.  He reports 2 weeks of diarrhea.  8 episodes per day, also vomiting at least once or twice a day as well.   ED Course: Creat 3.4 up from 1.9, BUN 30, CT abd / pelvis shows liquid stool in colon c/w diarrheal illness. C.Diff neg, GI pathogen pnl pending, given cipro.  IVF bolus.   Review of Systems: As per HPI otherwise 10 point review of systems negative.   Past Medical History:  Diagnosis Date  . Aortic insufficiency    mild to moderate   . Arteriosclerotic cardiovascular disease (ASCVD)    EF 60% -- Non-Q MI in 07/2008->  BMS to mid LAD  . Axillary abscess 2011   right  . Cerebrovascular disease    CVA  . Chronic obstructive pulmonary disease ([place])   . Diabetes mellitus type II   . Diabetes mellitus, type 2 (HCC)    A1c of 5.6 in 05/2011  . Hyperlipidemia   . Hypertension   . Mild anemia    Result.  Nl H&H in 02/2011  . Obesity   . Schizophrenia ([place])    Schizophrenia/bipolar disease/mental retardation  . Sinus bradycardia    On low dose beta blocker  . Tobacco abuse    15 pack years; 0.5 pack per day    Past Surgical History:  Procedure Laterality Date  . [place]     reports that he has been smoking cigarettes. He started smoking about 43 years ago. He has a 15.00 pack-year smoking history. He has never used smokeless tobacco. He reports that he does not drink alcohol or use drugs.  No Known Allergies  Family History  Problem Relation Age of Onset  . Hypertension  Unknown   . Arthritis Unknown      Prior to Admission medications   Medication Sig Start Date End Date Taking? Authorizing Provider  acetaminophen (TYLENOL) 500 MG tablet Take 1 tablet (500 mg total) by mouth every 6 (six) hours as needed. 01/11/16  Yes Idol, Almyra Free, PA-C  amLODipine (NORVASC) 10 MG tablet Take 10 mg by mouth every morning.    Yes [provider]  aspirin EC 81 MG tablet Take 81 mg by mouth every morning.    Yes [provider]  CATAPRES-TTS-3 0.3 MG/24HR patch PLACE 1 PATCH ONTO SKIN ONCE A WEEK. REMOVE OLD PATCH. Patient taking differently: Place 0.3 mg onto the skin every Thursday.  02/01/18  Yes Herminio Commons, MD  clobetasol cream (TEMOVATE) 9.56 % Apply 1 application topically every morning. Applied to heels of feet   Yes [provider]  diclofenac sodium (VOLTAREN) 1 % GEL Apply 2 g topically 4 (four) times daily.   Yes [provider]  fish oil-omega-3 fatty acids 1000 MG capsule Take 1 g by mouth 4 (four) times daily.     Yes [provider]  haloperidol decanoate (HALDOL DECANOATE) 100 MG/ML injection Inject 100 mg into the muscle every 14 (fourteen) days.     Yes [provider]  hydrALAZINE (  APRESOLINE) 100 MG tablet TAKE (1) TABLET BY MOUTH (3) TIMES DAILY. Patient taking differently: Take 100 mg by mouth 3 (three) times daily.  02/06/18  Yes Herminio Commons, MD  insulin glargine (LANTUS) 100 UNIT/ML injection Inject 20 Units into the skin 2 (two) times daily. For blood sugar levels over 100. Do not administer for blood sugar levels lower than 100   Yes [provider]  ketoconazole (NIZORAL) 2 % cream Apply 1 application topically every morning. Applied to feet   Yes [provider]  loperamide (QC ANTI-DIARRHEAL) 2 MG tablet Take 2 mg by mouth 4 (four) times daily as needed for diarrhea or loose stools.   Yes [provider]  losartan (COZAAR) 25 MG tablet TAKE 1 TABLET BY  MOUTH ONCE DAILY. Patient taking differently: Take 25 mg by mouth every morning.  02/06/18  Yes Herminio Commons, MD  metoprolol tartrate (LOPRESSOR) 25 MG tablet TAKE (1/2) TABLET BY MOUTH DAILY. Patient taking differently: Take 12.5 mg by mouth every morning.  08/08/17  Yes Herminio Commons, MD  Multiple Vitamin (DAILY VITE PO) Take 1 tablet by mouth every morning.    Yes [provider]  nitroGLYCERIN (NITROSTAT) 0.4 MG SL tablet Place 0.4 mg under the tongue every 5 (five) minutes as needed for chest pain.   Yes [provider]  potassium chloride (K-DUR) 10 MEQ tablet TAKE (1) TABLET BY MOUTH ONCE DAILY. Patient taking differently: Take 10 mEq by mouth every morning.  09/06/17  Yes Herminio Commons, MD  pravastatin (PRAVACHOL) 40 MG tablet Take 40 mg by mouth at bedtime.    Yes [provider]  traZODone (DESYREL) 50 MG tablet Take 50 mg by mouth at bedtime.   Yes [provider]    Physical Exam: Vitals:   02/06/18 1511 02/06/18 1512 02/06/18 2000  BP:  (!) 149/65 (!) 149/66  Pulse:  80 73  Resp:  18 20  Temp:  98.6 F (37 C) 98.9 F (37.2 C)  TempSrc:  Tympanic Oral  SpO2:  93% 96%  Weight: 105.2 kg    Height: 5' 8.5" (1.74 m)      Constitutional: NAD, calm, comfortable Eyes: PERRL, lids and conjunctivae normal ENMT: Mucous membranes are moist. Posterior pharynx clear of any exudate or lesions.Normal dentition.  Neck: normal, supple, no masses, no thyromegaly Respiratory: clear to auscultation bilaterally, no wheezing, no crackles. Normal respiratory effort. No accessory muscle use.  Cardiovascular: Regular rate and rhythm, no murmurs / rubs / gallops. No extremity edema. 2+ pedal pulses. No carotid bruits.  Abdomen: no tenderness, no masses palpated. No hepatosplenomegaly. Bowel sounds positive.  Musculoskeletal: no clubbing / cyanosis. No joint deformity upper and lower extremities. Good ROM, no contractures. Normal muscle tone.    Skin: no rashes, lesions, ulcers. No induration Neurologic: CN 2-12 grossly intact. Sensation intact, DTR normal. Strength 5/5 in all 4.  Psychiatric: Normal judgment and insight. Alert and oriented x 3. Normal mood.    Labs on Admission: I have personally reviewed following labs and imaging studies  CBC: Recent Labs  Lab 02/06/18 1543 02/06/18 2016  WBC 6.6  --   NEUTROABS 4.2  --   HGB 12.0* 11.6*  HCT 38.7* 34.0*  MCV 86.0  --   PLT 199  --    Basic Metabolic Panel: Recent Labs  Lab 02/06/18 1543 02/06/18 2016  NA 139 142  K 3.6 3.7  CL 109 110  CO2 20*  --   GLUCOSE  97 86  BUN 35* 34*  CREATININE 3.40* 3.60*  CALCIUM 8.8*  --   MG 1.8  --    GFR: Estimated Creatinine Clearance: 27.8 mL/min (A) (by C-G formula based on SCr of 3.6 mg/dL (H)). Liver Function Tests: Recent Labs  Lab 02/06/18 1543  AST 13*  ALT 23  ALKPHOS 67  BILITOT 0.8  PROT 6.9  ALBUMIN 3.2*   Recent Labs  Lab 02/06/18 1543  LIPASE 24   No results for input(s): AMMONIA in the last 168 hours. Coagulation Profile: No results for input(s): INR, PROTIME in the last 168 hours. Cardiac Enzymes: No results for input(s): CKTOTAL, CKMB, CKMBINDEX, TROPONINI in the last 168 hours. BNP (last 3 results) No results for input(s): PROBNP in the last 8760 hours. HbA1C: No results for input(s): HGBA1C in the last 72 hours. CBG: No results for input(s): GLUCAP in the last 168 hours. Lipid Profile: No results for input(s): CHOL, HDL, LDLCALC, TRIG, CHOLHDL, LDLDIRECT in the last 72 hours. Thyroid Function Tests: No results for input(s): TSH, T4TOTAL, FREET4, T3FREE, THYROIDAB in the last 72 hours. Anemia Panel: No results for input(s): VITAMINB12, FOLATE, FERRITIN, TIBC, IRON, RETICCTPCT in the last 72 hours. Urine analysis:    Component Value Date/Time   COLORURINE YELLOW 02/06/2018 1535   APPEARANCEUR CLEAR 02/06/2018 1535   LABSPEC 1.015 02/06/2018 1535   PHURINE 5.0 02/06/2018 1535    GLUCOSEU NEGATIVE 02/06/2018 1535   HGBUR NEGATIVE 02/06/2018 1535   BILIRUBINUR NEGATIVE 02/06/2018 1535   KETONESUR NEGATIVE 02/06/2018 1535   PROTEINUR 100 (A) 02/06/2018 1535   UROBILINOGEN 0.2 12/17/2012 1644   NITRITE NEGATIVE 02/06/2018 1535   LEUKOCYTESUR NEGATIVE 02/06/2018 1535    Radiological Exams on Admission: Ct Abdomen Pelvis Wo Contrast  Result Date: 02/06/2018 CLINICAL DATA:  Diarrhea nausea EXAM: CT ABDOMEN AND PELVIS WITHOUT CONTRAST TECHNIQUE: Multidetector CT imaging of the abdomen and pelvis was performed following the standard protocol without IV contrast. COMPARISON:  CT 12/24/2006 FINDINGS: Lower chest: Lung bases demonstrate no acute consolidation or effusion. Borderline heart size. Hepatobiliary: No focal liver abnormality is seen. Slight increased density in the gallbladder may reflect tiny stones or small amount of sludge. No biliary dilatation Pancreas: Unremarkable. No pancreatic ductal dilatation or surrounding inflammatory changes. Spleen: Normal in size without focal abnormality. Adrenals/Urinary Tract: Adrenal glands are unremarkable. Kidneys are normal, without renal calculi, focal lesion, or hydronephrosis. Bladder is unremarkable. Stomach/Bowel: The stomach is nonenlarged. No dilated small bowel. Diffuse fluid in the colon. No colon wall thickening. Negative appendix Vascular/Lymphatic: Nonaneurysmal aorta. Mild aortic atherosclerosis. Subcentimeter mesenteric and retroperitoneal nodes. Reproductive: Prostate is unremarkable. Other: Negative for free air or free fluid. Musculoskeletal: Progressed arthritis of the bilateral hips, left greater than right with sclerosis and left acetabular cyst formation. IMPRESSION: 1. Fluid within the colon consistent with diarrheal illness. No focal wall thickening to suggest a colitis. Negative for obstruction or free air. Negative for appendicitis. 2. Possible small amount of gallbladder sludge or stone. Electronically Signed    By: Donavan Foil M.D.   On: 02/06/2018 19:52    EKG: Independently reviewed.  Assessment/Plan Principal Problem:   Diarrhea Active Problems:   Hypertension   Schizophrenia ([place])   Diabetes mellitus, type 2 (HCC)   Acute renal failure superimposed on stage 3 chronic kidney disease ([place])    1. Diarrhea - 1. GI pathogen pnl pending 2. C.Diff neg 3. CT just shows diarrheal illness 4. Empiric rocephin / flagyl 5. Hemoccult neg 2. AKI on CKD stage  3 - 1. IVF: 2L bolus in ED and 125 cc/hr 2. Strict intake and output 3. Urine lytes 4. Renal US 5. Hold losartan 6. If not improving, may wish to get nephrology consult in AM 3. HTN - 1. Continue home BP meds except losartan 4. DM2 - 1. Sensitive scale SSI AC 5. Schizophrenia - 1. Cont home meds 2. Looks like he gets haldol every 14 days, just had last week.  DVT prophylaxis: Heparin [place] Code Status: Full Family Communication: No family in room Disposition Plan: Home Consults called: None Admission status: Admit to inpatient  Severity of Illness: The appropriate patient status for this patient is INPATIENT. Inpatient status is judged to be reasonable and necessary in order to provide the required intensity of service to ensure the patient's safety. The patient's presenting symptoms, physical exam findings, and initial radiographic and laboratory data in the context of their chronic comorbidities is felt to place them at high risk for further clinical deterioration. Furthermore, it is not anticipated that the patient will be medically stable for discharge from the hospital within 2 midnights of admission. The following factors support the patient status of inpatient.   " The patient's presenting symptoms include 2 weeks diarrhea, generalized weakness. " The initial radiographic and laboratory data are worrisome because of AKI with creat 3.4 up from a baseline of 1.9. " The chronic co-morbidities include CKD stage 3,  schizophrenia.   * I certify that at the point of admission it is my clinical judgment that the patient will require inpatient hospital care spanning beyond 2 midnights from the point of admission due to high intensity of service, high risk for further deterioration and high frequency of surveillance required.Etta Quill DO [facility] Pager [phone] Only works nights!  If 7AM-7PM, please contact the primary day team physician taking care of patient  [email or web address] Password [email or web address]  02/06/2018, 9:25 PM

## 2018-02-06 NOTE — ED Notes (Signed)
[place]anch, [phone] contact when pt is discharge

## 2018-02-06 NOTE — ED Provider Notes (Signed)
Emergency Department Provider Note   I have reviewed the triage vital signs and the nursing notes.   HISTORY  Chief Complaint Diarrhea   HPI [name] is a 52 y.o. male with multiple medical problems as documented below the presents to the emergency department today secondary to diarrhea.  Patient states he had multiple diarrhea episodes of at least 8 a day for the last 2 weeks.  He states he is vomited at least once or twice a day each of those days as well.  States he has not had a fever.  Feels like his abdomen is more swollen than normal but has no pain there.  No drugs or alcohol.  No recent antibiotics.  No blood or dark stools. No other associated or modifying symptoms.    Past Medical History:  Diagnosis Date  . Aortic insufficiency    mild to moderate   . Arteriosclerotic cardiovascular disease (ASCVD)    EF 60% -- Non-Q MI in 07/2008->  BMS to mid LAD  . Axillary abscess 2011   right  . Cerebrovascular disease    CVA  . Chronic obstructive pulmonary disease ([place])   . Diabetes mellitus type II   . Diabetes mellitus, type 2 (HCC)    A1c of 5.6 in 05/2011  . Hyperlipidemia   . Hypertension   . Mild anemia    Result.  Nl H&H in 02/2011  . Obesity   . Schizophrenia ([place])    Schizophrenia/bipolar disease/mental retardation  . Sinus bradycardia    On low dose beta blocker  . Tobacco abuse    15 pack years; 0.5 pack per day    Patient Active Problem List   Diagnosis Date Noted  . Acute renal failure superimposed on stage 3 chronic kidney disease ([place]) 02/06/2018  . Acute renal failure ([place]) 12/17/2012  . Dehydration 12/17/2012  . Diarrhea 12/17/2012  . Gastroenteritis 12/17/2012  . GERD (gastroesophageal reflux disease) 12/17/2012  . Metabolic acidosis 41/32/4401  . Chronic kidney disease, stage 2, mildly decreased GFR 03/19/2012  . Arteriosclerotic cardiovascular disease (ASCVD)   . Schizophrenia ([place])   . Tobacco abuse   . Diabetes mellitus,  type 2 ([place])   . Hypertension   . Hyperlipidemia   . Aortic insufficiency     Past Surgical History:  Procedure Laterality Date  . HERNIA REPAIR  1967    Current Outpatient Rx  . Order #: [id number] Class: Print  . Order #: [id number] Class: Historical Med  . Order #: [id number] Class: Historical Med  . Order #: [id number] Class: Normal  . Order #: [id number] Class: Historical Med  . Order #: [id number] Class: Historical Med  . Order #: [id number] Class: Historical Med  . Order #: [id number] Class: Historical Med  . Order #: [id number] Class: Normal  . Order #: [id number] Class: Historical Med  . Order #: [id number] Class: Historical Med  . Order #: [id number] Class: Historical Med  . Order #: [id number] Class: Normal  . Order #: [id number] Class: Normal  . Order #: [id number] Class: Historical Med  . Order #: [id number] Class: Historical Med  . Order #: [id number] Class: Normal  . Order #: [id number] Class: Historical Med  . Order #: [id number] Class: Historical Med    Allergies Patient has no known allergies.  Family History  Problem Relation Age of Onset  . Hypertension Unknown   . Arthritis Unknown     Social History Social History   Tobacco Use  . Smoking status: Current Some Day Smoker    Packs/day: 0.50    Years: 30.00    Pack  years: 15.00    Types: Cigarettes    Start date: 12/03/1974  . Smokeless tobacco: Never Used  . Tobacco comment: Patient is not motivated to quit.  He attributes his smoking to the lack of other activities.  Substance Use Topics  . Alcohol use: No    Alcohol/week: 0.0 standard drinks  . Drug use: No    Review of Systems  All other systems negative except as documented in the HPI. All pertinent positives and negatives as reviewed in the HPI. ____________________________________________   PHYSICAL EXAM:  VITAL SIGNS: ED Triage Vitals  Enc Vitals Group     BP 02/06/18 1512 (!) 149/65     Pulse Rate 02/06/18 1512 80     Resp 02/06/18 1512 18     Temp 02/06/18  1512 98.6 F (37 C)     Temp Source 02/06/18 1512 Tympanic     SpO2 02/06/18 1512 93 %     Weight 02/06/18 1511 232 lb (105.2 kg)     Height 02/06/18 1511 5' 8.5" (1.74 m)     Head Circumference --      Peak Flow --      Pain Score 02/06/18 1512 7     Pain Loc --      Pain Edu? --      Excl. in [place]? --     Constitutional: Alert and oriented. Well appearing and in no acute distress. Eyes: Conjunctivae are slightly icteric. PERRL. EOMI. Head: Atraumatic. Nose: No congestion/rhinnorhea. Mouth/Throat: Mucous membranes are moist.  Oropharynx non-erythematous. Neck: No stridor.  No meningeal signs.   Cardiovascular: Normal rate, regular rhythm. Good peripheral circulation. Grossly normal heart sounds.   Respiratory: Normal respiratory effort.  No retractions. Lungs CTAB. Gastrointestinal: Soft and diffusely tender.  Mild distention with tympany to percussion.   Musculoskeletal: No lower extremity tenderness nor edema. No gross deformities of extremities. Neurologic:  Normal speech and language. No gross focal neurologic deficits are appreciated.  Skin:  Skin is warm, dry and intact. No rash noted.   ____________________________________________   LABS (all labs ordered are listed, but only abnormal results are displayed)  Labs Reviewed  CBC WITH DIFFERENTIAL/PLATELET - Abnormal; Notable for the following components:      Result Value   Hemoglobin 12.0 (*)    HCT 38.7 (*)    RDW 15.7 (*)    All other components within normal limits  COMPREHENSIVE METABOLIC PANEL - Abnormal; Notable for the following components:   CO2 20 (*)    BUN 35 (*)    Creatinine, Ser 3.40 (*)    Calcium 8.8 (*)    Albumin 3.2 (*)    AST 13 (*)    GFR calc non Af Amer 19 (*)    GFR calc Af Amer 22 (*)    All other components within normal limits  URINALYSIS, ROUTINE W REFLEX MICROSCOPIC - Abnormal; Notable for the following components:   Protein, ur 100 (*)    All other components within normal limits    I-STAT CHEM 8, ED - Abnormal; Notable for the following components:   BUN 34 (*)    Creatinine, Ser 3.60 (*)    TCO2 21 (*)    Hemoglobin 11.6 (*)    HCT 34.0 (*)    All other components within normal limits  C DIFFICILE QUICK SCREEN W PCR REFLEX  GASTROINTESTINAL PANEL BY PCR, STOOL (REPLACES STOOL CULTURE)  MAGNESIUM  LIPASE, BLOOD  HIV ANTIBODY (ROUTINE TESTING W REFLEX)  CBC  BASIC METABOLIC PANEL  SODIUM, URINE, RANDOM  CREATININE, URINE, RANDOM   ____________________________________________    RADIOLOGY  Ct Abdomen Pelvis Wo Contrast  Result Date: 02/06/2018 CLINICAL DATA:  Diarrhea nausea EXAM: CT ABDOMEN AND PELVIS WITHOUT CONTRAST TECHNIQUE: Multidetector CT imaging of the abdomen and pelvis was performed following the standard protocol without IV contrast. COMPARISON:  CT 12/24/2006 FINDINGS: Lower chest: Lung bases demonstrate no acute consolidation or effusion. Borderline heart size. Hepatobiliary: No focal liver abnormality is seen. Slight increased density in the gallbladder may reflect tiny stones or small amount of sludge. No biliary dilatation Pancreas: Unremarkable. No pancreatic ductal dilatation or surrounding inflammatory changes. Spleen: Normal in size without focal abnormality. Adrenals/Urinary Tract: Adrenal glands are unremarkable. Kidneys are normal, without renal calculi, focal lesion, or hydronephrosis. Bladder is unremarkable. Stomach/Bowel: The stomach is nonenlarged. No dilated small bowel. Diffuse fluid in the colon. No colon wall thickening. Negative appendix Vascular/Lymphatic: Nonaneurysmal aorta. Mild aortic atherosclerosis. Subcentimeter mesenteric and retroperitoneal nodes. Reproductive: Prostate is unremarkable. Other: Negative for free air or free fluid. Musculoskeletal: Progressed arthritis of the bilateral hips, left greater than right with sclerosis and left acetabular cyst formation. IMPRESSION: 1. Fluid within the colon consistent with  diarrheal illness. No focal wall thickening to suggest a colitis. Negative for obstruction or free air. Negative for appendicitis. 2. Possible small amount of gallbladder sludge or stone. Electronically Signed   By: Donavan Foil M.D.   On: 02/06/2018 19:52    ____________________________________________   INITIAL IMPRESSION / ASSESSMENT AND PLAN / ED COURSE  Eval for any endorgan damage secondary to 2 weeks of diarrhea such as dehydration.  Also check stool studies to include C. difficile even though he does not have a history of been on antibiotics recently with his distended abdomen would be worried about C. difficile colitis or toxically:.  We will get a CT scan secondary to diffuse tenderness and distention of his abdomen.  Otherwise I will treat him symptomatically at this time.  AKI secondary to dehydration liekly from diarrhea. Treated with cipro pending stool studies. AKI did not improve with IVF, will d/w medicien regarding observation for fluids/recheck kidneys.   Pertinent labs & imaging results that were available during my care of the patient were reviewed by me and considered in my medical decision making (see chart for details).  ____________________________________________  FINAL CLINICAL IMPRESSION(S) / ED DIAGNOSES  Final diagnoses:  Diarrhea of presumed infectious origin  Acute kidney injury ([place])     MEDICATIONS GIVEN DURING THIS VISIT:  Medications  metroNIDAZOLE (FLAGYL) IVPB 500 mg (has no administration in time range)  0.9 %  sodium chloride infusion (has no administration in time range)  acetaminophen (TYLENOL) tablet 650 mg (has no administration in time range)    Or  acetaminophen (TYLENOL) suppository 650 mg (has no administration in time range)  ondansetron (ZOFRAN) tablet 4 mg (has no administration in time range)    Or  ondansetron (ZOFRAN) injection 4 mg (has no administration in time range)  heparin injection 5,000 Units (has no administration in  time range)  cefTRIAXone (ROCEPHIN) 2 g in sodium chloride 0.9 % 100 mL IVPB (has no administration in time range)  amLODipine (NORVASC) tablet 10 mg (has no administration in time range)  aspirin EC tablet 81 mg (has no administration in time range)  cloNIDine (CATAPRES - Dosed in mg/24 hr) patch 0.3 mg (has no administration in time range)  clobetasol cream (TEMOVATE) 6.07 % 1 application (has no administration in time range)  hydrALAZINE (APRESOLINE) tablet 100 mg (has no administration in time range)  fish oil-omega-3 fatty acids capsule 1 g (has no administration in time range)  metoprolol tartrate (LOPRESSOR) tablet 12.5 mg (has no administration in time range)  loperamide (IMODIUM) capsule 2 mg (has no administration in time range)  traZODone (DESYREL) tablet 50 mg (has no administration in time range)  insulin aspart (novoLOG) injection 0-9 Units (has no administration in time range)  ondansetron (ZOFRAN) injection 4 mg (4 mg Intravenous Given 02/06/18 1551)  lactated ringers bolus 1,000 mL (0 mLs Intravenous Stopped 02/06/18 1813)  lactated ringers bolus 1,000 mL ( Intravenous Rate/Dose Verify 02/06/18 2159)  loperamide (IMODIUM) capsule 4 mg (4 mg Oral Given 02/06/18 2011)  ciprofloxacin (CIPRO) IVPB 400 mg ( Intravenous Stopped 02/06/18 2157)     NEW OUTPATIENT MEDICATIONS STARTED DURING THIS VISIT:  New Prescriptions   No medications on file    Note:  This note was prepared with assistance of Dragon voice recognition software. Occasional wrong-word or sound-a-like substitutions may have occurred due to the inherent limitations of voice recognition software.   Merrily Pew, MD 02/06/18 2211

## 2018-02-06 NOTE — ED Notes (Signed)
De Blanch is contact for pt She is group home owner that pt stays at [facility] [phone]

## 2018-02-06 NOTE — ED Notes (Signed)
Pt updated on plan of care, denies any complaints,

## 2018-02-06 NOTE — ED Triage Notes (Signed)
Pt c/o 2 weeks of D/. Increased weakness. Pt unable to state if he is having dark tarry or bloody stools.

## 2018-02-07 ENCOUNTER — Inpatient Hospital Stay (HOSPITAL_COMMUNITY): Payer: Medicaid Other

## 2018-02-07 LAB — GASTROINTESTINAL PANEL BY PCR, STOOL (REPLACES STOOL CULTURE)

## 2018-02-07 LAB — BASIC METABOLIC PANEL
Anion gap: 9 (ref 5–15)
BUN: 26 mg/dL — ABNORMAL HIGH (ref 6–20)
CO2: 18 mmol/L — ABNORMAL LOW (ref 22–32)
Calcium: 8.5 mg/dL — ABNORMAL LOW (ref 8.9–10.3)
Chloride: 113 mmol/L — ABNORMAL HIGH (ref 98–111)
Creatinine, Ser: 2.8 mg/dL — ABNORMAL HIGH (ref 0.61–1.24)
GFR calc Af Amer: 28 mL/min — ABNORMAL LOW (ref >60)
GFR calc non Af Amer: 24 mL/min — ABNORMAL LOW (ref >60)
Glucose, Bld: 98 mg/dL (ref 70–99)
Potassium: 3.6 mmol/L (ref 3.5–5.1)
Sodium: 140 mmol/L (ref 135–145)

## 2018-02-07 LAB — GLUCOSE, CAPILLARY
Glucose-Capillary: 82 mg/dL (ref 70–99)
Glucose-Capillary: 85 mg/dL (ref 70–99)
Glucose-Capillary: 88 mg/dL (ref 70–99)
Glucose-Capillary: 84 mg/dL (ref 70–99)

## 2018-02-07 LAB — CBC
HCT: 35.2 % — ABNORMAL LOW (ref 39.0–52.0)
Hemoglobin: 10.6 g/dL — ABNORMAL LOW (ref 13.0–17.0)
MCH: 26.4 pg (ref 26.0–34.0)
MCHC: 30.1 g/dL (ref 30.0–36.0)
MCV: 87.8 fL (ref 80.0–100.0)
Platelets: 187 10*3/uL (ref 150–400)
RBC: 4.01 MIL/uL — ABNORMAL LOW (ref 4.22–5.81)
RDW: 15.7 % — ABNORMAL HIGH (ref 11.5–15.5)
WBC: 6.8 10*3/uL (ref 4.0–10.5)
nRBC: 0 % (ref 0.0–0.2)

## 2018-02-07 MED ORDER — METOPROLOL TARTRATE 25 MG PO TABS
25.0000 mg | ORAL_TABLET | Freq: Every morning | ORAL | Status: DC
  Administered 2018-02-08: 25 mg via ORAL
  Filled 2018-02-07: qty 1

## 2018-02-07 MED ORDER — CLOBETASOL PROPIONATE 0.05 % EX OINT
TOPICAL_OINTMENT | Freq: Two times a day (BID) | CUTANEOUS | Status: DC
  Administered 2018-02-07 – 2018-02-08 (×2): via TOPICAL
  Filled 2018-02-07: qty 15

## 2018-02-07 NOTE — Progress Notes (Signed)
PROGRESS NOTE    [name]  OIT:[id number] DOB: 1965-12-12 DOA: 02/06/2018 PCP: Sinda Du, MD    Brief Narrative:  52 year old male with a history of hypertension, diabetes, chronic kidney disease stage III, schizophrenia, admitted to the hospital with generalized weakness and diarrhea.  He was noted to have dehydration and acute kidney injury on chronic kidney disease.  Admitted for IV fluids and supportive management.  Stool studies have been unrevealing.   Assessment & Plan:   Principal Problem:   Diarrhea Active Problems:   Hypertension   Schizophrenia ([place])   Diabetes mellitus, type 2 ([place])   Acute renal failure superimposed on stage 3 chronic kidney disease ([place])   1. Diarrhea.  C. difficile panel is negative.  GI pathogen panel also negative.  CT shows diarrheal illness.  Discontinue further antibiotics.  Suspect viral illness.  Treat supportively.  Overall symptoms appear to be improving. 2. Acute kidney injury on chronic kidney disease stage III.  Related to diarrhea.  This is improving with IV fluids.  Continue to hold losartan.  Continue IV fluids for another 24 hours.  Renal ultrasound does not show any acute process. 3. Hypertension.  On metoprolol, clonidine, hydralazine and amlodipine.  Blood pressures are elevated since losartan has been held.  Will increase metoprolol for now. 4. Diabetes.  Continue on sliding scale insulin.  Blood sugar stable. 5. Schizophrenia.  Continued on home regimen.  DVT prophylaxis: Heparin Code Status: Full code Family Communication: No family present Disposition Plan: Anticipate discharge home tomorrow if renal function continues to improve and GI symptoms have resolved   Consultants:     Procedures:     Antimicrobials:   Ceftriaxone 11/5 > 11/6  Flagyl 11/5 > 11/6   Subjective: Feeling better.  No vomiting.  Diarrhea improving.  He had one loose bowel movement this morning.  Objective: Vitals:   02/06/18 2157 02/06/18 2234 02/07/18 0700 02/07/18 1459  BP: (!) 164/72 (!) 155/87 (!) 175/60 (!) 176/73  Pulse: 78 77 83 74  Resp: $Remo'16 16  18  'dDKoJ$ Temp: 98.1 F (36.7 C) 98.6 F (37 C) 98.9 F (37.2 C) 99.6 F (37.6 C)  TempSrc: Oral Oral Oral Oral  SpO2: 98% 98% 95% 94%  Weight:  47.2 kg    Height:        Intake/Output Summary (Last 24 hours) at 02/07/2018 1721 Last data filed at 02/07/2018 1500 Gross per 24 hour  Intake 4571.52 ml  Output 2000 ml  Net 2571.52 ml   Filed Weights   02/06/18 1511 02/06/18 2234  Weight: 105.2 kg 47.2 kg    Examination:  General exam: Appears calm and comfortable  Respiratory system: Clear to auscultation. Respiratory effort normal. Cardiovascular system: S1 & S2 heard, RRR. No JVD, murmurs, rubs, gallops or clicks. No pedal edema. Gastrointestinal system: Abdomen is nondistended, soft and nontender. No organomegaly or masses felt. Normal bowel sounds heard. Central nervous system: Alert and oriented. No focal neurological deficits. Extremities: Symmetric 5 x 5 power. Skin: No rashes, lesions or ulcers Psychiatry: Judgement and insight appear normal. Mood & affect appropriate.     Data Reviewed: I have personally reviewed following labs and imaging studies  CBC: Recent Labs  Lab 02/06/18 1543 02/06/18 2016 02/07/18 0516  WBC 6.6  --  6.8  NEUTROABS 4.2  --   --   HGB 12.0* 11.6* 10.6*  HCT 38.7* 34.0* 35.2*  MCV 86.0  --  87.8  PLT 199  --  187   Basic  Metabolic Panel: Recent Labs  Lab 02/06/18 1543 02/06/18 2016 02/07/18 0516  NA 139 142 140  K 3.6 3.7 3.6  CL 109 110 113*  CO2 20*  --  18*  GLUCOSE 97 86 98  BUN 35* 34* 26*  CREATININE 3.40* 3.60* 2.80*  CALCIUM 8.8*  --  8.5*  MG 1.8  --   --    GFR: Estimated Creatinine Clearance: 20.1 mL/min (A) (by C-G formula based on SCr of 2.8 mg/dL (H)). Liver Function Tests: Recent Labs  Lab 02/06/18 1543  AST 13*  ALT 23  ALKPHOS 67  BILITOT 0.8  PROT 6.9  ALBUMIN  3.2*   Recent Labs  Lab 02/06/18 1543  LIPASE 24   No results for input(s): AMMONIA in the last 168 hours. Coagulation Profile: No results for input(s): INR, PROTIME in the last 168 hours. Cardiac Enzymes: No results for input(s): CKTOTAL, CKMB, CKMBINDEX, TROPONINI in the last 168 hours. BNP (last 3 results) No results for input(s): PROBNP in the last 8760 hours. HbA1C: No results for input(s): HGBA1C in the last 72 hours. CBG: Recent Labs  Lab 02/06/18 2321 02/07/18 0735 02/07/18 1117 02/07/18 1643  GLUCAP 85 82 85 88   Lipid Profile: No results for input(s): CHOL, HDL, LDLCALC, TRIG, CHOLHDL, LDLDIRECT in the last 72 hours. Thyroid Function Tests: No results for input(s): TSH, T4TOTAL, FREET4, T3FREE, THYROIDAB in the last 72 hours. Anemia Panel: No results for input(s): VITAMINB12, FOLATE, FERRITIN, TIBC, IRON, RETICCTPCT in the last 72 hours. Sepsis Labs: No results for input(s): PROCALCITON, LATICACIDVEN in the last 168 hours.  Recent Results (from the past 240 hour(s))  Gastrointestinal Panel by PCR , Stool     Status: None   Collection Time: 02/06/18  3:35 PM  Result Value Ref Range Status   Campylobacter species NOT DETECTED NOT DETECTED Final   Plesimonas shigelloides NOT DETECTED NOT DETECTED Final   Salmonella species NOT DETECTED NOT DETECTED Final   Yersinia enterocolitica NOT DETECTED NOT DETECTED Final   Vibrio species NOT DETECTED NOT DETECTED Final   Vibrio cholerae NOT DETECTED NOT DETECTED Final   Enteroaggregative E coli (EAEC) NOT DETECTED NOT DETECTED Final   Enteropathogenic E coli (EPEC) NOT DETECTED NOT DETECTED Final   Enterotoxigenic E coli (ETEC) NOT DETECTED NOT DETECTED Final   Shiga like toxin producing E coli (STEC) NOT DETECTED NOT DETECTED Final   Shigella/Enteroinvasive E coli (EIEC) NOT DETECTED NOT DETECTED Final   Cryptosporidium NOT DETECTED NOT DETECTED Final   Cyclospora cayetanensis NOT DETECTED NOT DETECTED Final    Entamoeba histolytica NOT DETECTED NOT DETECTED Final   Giardia lamblia NOT DETECTED NOT DETECTED Final   Adenovirus F40/41 NOT DETECTED NOT DETECTED Final   Astrovirus NOT DETECTED NOT DETECTED Final   Norovirus GI/GII NOT DETECTED NOT DETECTED Final   Rotavirus A NOT DETECTED NOT DETECTED Final   Sapovirus (I, II, IV, and V) NOT DETECTED NOT DETECTED Final    Comment: Performed at [facility], [address] [place], [place] [address]  C difficile quick scan w PCR reflex     Status: None   Collection Time: 02/06/18  3:35 PM  Result Value Ref Range Status   C Diff antigen NEGATIVE NEGATIVE Final   C Diff toxin NEGATIVE NEGATIVE Final   C Diff interpretation No C. difficile detected.  Final    Comment: Performed at [facility], [address], [place], [place] [address]         Radiology Studies:  Ct Abdomen Pelvis Wo Contrast  Result Date: 02/06/2018 CLINICAL DATA:  Diarrhea nausea EXAM: CT ABDOMEN AND PELVIS WITHOUT CONTRAST TECHNIQUE: Multidetector CT imaging of the abdomen and pelvis was performed following the standard protocol without IV contrast. COMPARISON:  CT 12/24/2006 FINDINGS: Lower chest: Lung bases demonstrate no acute consolidation or effusion. Borderline heart size. Hepatobiliary: No focal liver abnormality is seen. Slight increased density in the gallbladder may reflect tiny stones or small amount of sludge. No biliary dilatation Pancreas: Unremarkable. No pancreatic ductal dilatation or surrounding inflammatory changes. Spleen: Normal in size without focal abnormality. Adrenals/Urinary Tract: Adrenal glands are unremarkable. Kidneys are normal, without renal calculi, focal lesion, or hydronephrosis. Bladder is unremarkable. Stomach/Bowel: The stomach is nonenlarged. No dilated small bowel. Diffuse fluid in the colon. No colon wall thickening. Negative appendix Vascular/Lymphatic: Nonaneurysmal aorta. Mild aortic atherosclerosis. Subcentimeter mesenteric and  retroperitoneal nodes. Reproductive: Prostate is unremarkable. Other: Negative for free air or free fluid. Musculoskeletal: Progressed arthritis of the bilateral hips, left greater than right with sclerosis and left acetabular cyst formation. IMPRESSION: 1. Fluid within the colon consistent with diarrheal illness. No focal wall thickening to suggest a colitis. Negative for obstruction or free air. Negative for appendicitis. 2. Possible small amount of gallbladder sludge or stone. Electronically Signed   By: Donavan Foil M.D.   On: 02/06/2018 19:52   US Renal  Result Date: 02/07/2018 CLINICAL DATA:  Acute kidney injury. EXAM: RENAL / URINARY TRACT ULTRASOUND COMPLETE COMPARISON:  CT 02/06/2018. FINDINGS: Right Kidney: Renal measurements: 10.1 x 5.7 x 6.1 cm = volume: 182.4 mL. Increased echogenicity. No mass or hydronephrosis visualized. Left Kidney: Renal measurements: 10.1 x 5.3 x 6.0 cm = volume: 166.2 mL. Increased echogenicity. No mass or hydronephrosis visualized. Bladder: Appears normal for degree of bladder distention. IMPRESSION: Increased echogenicity both kidneys. This is consistent chronic medical renal disease. No acute abnormality identified. No evidence of hydronephrosis. Electronically Signed   By: Marcello Moores  Register   On: 02/07/2018 12:28        Scheduled Meds: . amLODipine  10 mg Oral q morning - 10a  . aspirin EC  81 mg Oral q morning - 10a  . clobetasol ointment   Topical BID  . [START ON 02/08/2018] cloNIDine  0.3 mg Transdermal Q Thu  . heparin  5,000 Units Subcutaneous Q8H  . hydrALAZINE  100 mg Oral TID  . insulin aspart  0-9 Units Subcutaneous TID WC  . metoprolol tartrate  12.5 mg Oral q morning - 10a  . omega-3 acid ethyl esters  1 g Oral QID  . traZODone  50 mg Oral QHS   Continuous Infusions: . sodium chloride 125 mL/hr at 02/07/18 1647  . cefTRIAXone (ROCEPHIN)  IV Stopped (02/06/18 2245)  . metronidazole 500 mg (02/07/18 1300)     LOS: 1 day    Time spent:  37mins    Kathie Dike, MD [facility] Pager [phone]  If 7PM-7AM, please contact night-coverage [email or web address] Password [email or web address] 02/07/2018, 5:21 PM

## 2018-02-08 DIAGNOSIS — N183 Chronic kidney disease, stage 3 (moderate): Secondary | ICD-10-CM

## 2018-02-08 DIAGNOSIS — R197 Diarrhea, unspecified: Secondary | ICD-10-CM

## 2018-02-08 DIAGNOSIS — I10 Essential (primary) hypertension: Secondary | ICD-10-CM

## 2018-02-08 DIAGNOSIS — Z794 Long term (current) use of insulin: Secondary | ICD-10-CM

## 2018-02-08 DIAGNOSIS — N179 Acute kidney failure, unspecified: Principal | ICD-10-CM

## 2018-02-08 DIAGNOSIS — E1122 Type 2 diabetes mellitus with diabetic chronic kidney disease: Secondary | ICD-10-CM

## 2018-02-08 DIAGNOSIS — F209 Schizophrenia, unspecified: Secondary | ICD-10-CM

## 2018-02-08 LAB — BASIC METABOLIC PANEL
Anion gap: 8 (ref 5–15)
BUN: 22 mg/dL — ABNORMAL HIGH (ref 6–20)
CO2: 17 mmol/L — ABNORMAL LOW (ref 22–32)
Calcium: 8.9 mg/dL (ref 8.9–10.3)
Chloride: 120 mmol/L — ABNORMAL HIGH (ref 98–111)
Creatinine, Ser: 2.55 mg/dL — ABNORMAL HIGH (ref 0.61–1.24)
GFR calc Af Amer: 31 mL/min — ABNORMAL LOW (ref >60)
GFR calc non Af Amer: 27 mL/min — ABNORMAL LOW (ref >60)
Glucose, Bld: 90 mg/dL (ref 70–99)
Potassium: 3.8 mmol/L (ref 3.5–5.1)
Sodium: 145 mmol/L (ref 135–145)

## 2018-02-08 LAB — GLUCOSE, CAPILLARY: Glucose-Capillary: 97 mg/dL (ref 70–99)

## 2018-02-08 LAB — HIV ANTIBODY (ROUTINE TESTING W REFLEX): HIV Screen 4th Generation wRfx: NONREACTIVE

## 2018-02-08 MED ORDER — CLONIDINE 0.3 MG/24HR TD PTWK: 0.3000 mg | MEDICATED_PATCH | TRANSDERMAL | Status: DC

## 2018-02-08 MED ORDER — METOPROLOL TARTRATE 25 MG PO TABS: 25.0000 mg | ORAL_TABLET | Freq: Two times a day (BID) | ORAL | 3 refills | Status: DC

## 2018-02-08 MED ORDER — HYDRALAZINE HCL 100 MG PO TABS: 100.0000 mg | ORAL_TABLET | Freq: Three times a day (TID) | ORAL | Status: DC

## 2018-02-08 NOTE — Clinical Social Work Note (Addendum)
Patient is a resident at [facility]. LCSW spoke with De Blanch, owner, advising that patient was discharged. She indicated that she would pick patient up by 12 noon. Facility fax verified and discharge clinicals sent via [place] hub.   LCSW notified RN of pick up time.   LCSW signing off.    Raegen Tarpley, Clydene Pugh, LCSW

## 2018-02-08 NOTE — Discharge Instructions (Signed)
Diarrhea, Adult Diarrhea is when you have loose and water poop (stool) often. Diarrhea can make you feel weak and cause you to get dehydrated. Dehydration can make you tired and thirsty, make you have a dry mouth, and make it so you pee (urinate) less often. Diarrhea often lasts 2-3 days. However, it can last longer if it is a sign of something more serious. It is important to treat your diarrhea as told by your doctor. Follow these instructions at home: Eating and drinking  Follow these recommendations as told by your doctor:  Take an oral rehydration solution (ORS). This is a drink that is sold at pharmacies and stores.  Drink clear fluids, such as: ? Water. ? Ice chips. ? Diluted fruit juice. ? Low-calorie sports drinks.  Eat bland, easy-to-digest foods in small amounts as you are able. These foods include: ? Bananas. ? Applesauce. ? Rice. ? Low-fat (lean) meats. ? Toast. ? Crackers.  Avoid drinking fluids that have a lot of sugar or caffeine in them.  Avoid alcohol.  Avoid spicy or fatty foods.  General instructions   Drink enough fluid to keep your pee (urine) clear or pale yellow.  Wash your hands often. If you cannot use soap and water, use hand sanitizer.  Make sure that all people in your home wash their hands well and often.  Take over-the-counter and prescription medicines only as told by your doctor.  Rest at home while you get better.  Watch your condition for any changes.  Take a warm bath to help with any burning or pain from having diarrhea.  Keep all follow-up visits as told by your doctor. This is important. Contact a doctor if:  You have a fever.  Your diarrhea gets worse.  You have new symptoms.  You cannot keep fluids down.  You feel light-headed or dizzy.  You have a headache.  You have muscle cramps. Get help right away if:  You have chest pain.  You feel very weak or you pass out (faint).  You have bloody or black poop or  poop that look like tar.  You have very bad pain, cramping, or bloating in your belly (abdomen).  You have trouble breathing or you are breathing very quickly.  Your heart is beating very quickly.  Your skin feels cold and clammy.  You feel confused.  You have signs of dehydration, such as: ? Dark pee, hardly any pee, or no pee. ? Cracked lips. ? Dry mouth. ? Sunken eyes. ? Sleepiness. ? Weakness. This information is not intended to replace advice given to you by your health care provider. Make sure you discuss any questions you have with your health care provider. Document Released: 09/07/2007 Document Revised: 10/09/2015 Document Reviewed: 11/25/2014 Elsevier Interactive Patient Education  2018 [place] Choices to Help Relieve Diarrhea, Adult When you have diarrhea, the foods you eat and your eating habits are very important. Choosing the right foods and drinks can help:  Relieve diarrhea.  Replace lost fluids and nutrients.  Prevent dehydration.  What general guidelines should I follow? Relieving diarrhea  Choose foods with less than 2 g or .07 oz. of fiber per serving.  Limit fats to less than 8 tsp (38 g or 1.34 oz.) a day.  Avoid the following: ? Foods and beverages sweetened with high-fructose corn syrup, honey, or sugar alcohols such as xylitol, sorbitol, and mannitol. ? Foods that contain a lot of fat or sugar. ? Fried, greasy, or spicy  foods. ? High-fiber grains, breads, and cereals. ? Raw fruits and vegetables.  Eat foods that are rich in probiotics. These foods include dairy products such as yogurt and fermented milk products. They help increase healthy bacteria in the stomach and intestines (gastrointestinal tract, or GI tract).  If you have lactose intolerance, avoid dairy products. These may make your diarrhea worse.  Take medicine to help stop diarrhea (antidiarrheal medicine) only as told by your health care provider. Replacing  nutrients  Eat small meals or snacks every 3-4 hours.  Eat bland foods, such as white rice, toast, or baked potato, until your diarrhea starts to get better. Gradually reintroduce nutrient-rich foods as tolerated or as told by your health care provider. This includes: ? Well-cooked protein foods. ? Peeled, seeded, and soft-cooked fruits and vegetables. ? Low-fat dairy products.  Take vitamin and mineral supplements as told by your health care provider. Preventing dehydration   Start by sipping water or a special solution to prevent dehydration (oral rehydration solution, ORS). Urine that is clear or pale yellow means that you are getting enough fluid.  Try to drink at least 8-10 cups of fluid each day to help replace lost fluids.  You may add other liquids in addition to water, such as clear juice or decaffeinated sports drinks, as tolerated or as told by your health care provider.  Avoid drinks with caffeine, such as coffee, tea, or soft drinks.  Avoid alcohol. What foods are recommended? The items listed may not be a complete list. Talk with your health care provider about what dietary choices are best for you. Grains White rice. White, [place], or pita breads (fresh or toasted), including plain rolls, buns, or bagels. White pasta. Saltine, soda, or graham crackers. Pretzels. Low-fiber cereal. Cooked cereals made with water (such as cornmeal, farina, or cream cereals). Plain muffins. Matzo. Melba toast. Zwieback. Vegetables Potatoes (without the skin). Most well-cooked and canned vegetables without skins or seeds. Tender lettuce. Fruits Apple sauce. Fruits canned in juice. Cooked apricots, cherries, grapefruit, peaches, pears, or plums. Fresh bananas and cantaloupe. Meats and other protein foods Baked or boiled chicken. Eggs. Tofu. Fish. Seafood. Smooth nut butters. Ground or well-cooked tender beef, ham, veal, lamb, pork, or poultry. Dairy Plain yogurt, kefir, and unsweetened  liquid yogurt. Lactose-free milk, buttermilk, skim milk, or soy milk. Low-fat or nonfat hard cheese. Beverages Water. Low-calorie sports drinks. Fruit juices without pulp. Strained tomato and vegetable juices. Decaffeinated teas. Sugar-free beverages not sweetened with sugar alcohols. Oral rehydration solutions, if approved by your health care provider. Seasoning and other foods Bouillon, broth, or soups made from recommended foods. What foods are not recommended? The items listed may not be a complete list. Talk with your health care provider about what dietary choices are best for you. Grains Whole grain, whole wheat, bran, or rye breads, rolls, pastas, and crackers. Wild or brown rice. Whole grain or bran cereals. Barley. Oats and oatmeal. Corn tortillas or taco shells. Granola. Popcorn. Vegetables Raw vegetables. Fried vegetables. Cabbage, broccoli, Brussels sprouts, artichokes, baked beans, beet greens, corn, kale, legumes, peas, sweet potatoes, and yams. Potato skins. Cooked spinach and cabbage. Fruits Dried fruit, including raisins and dates. Raw fruits. Stewed or dried prunes. Canned fruits with syrup. Meat and other protein foods Fried or fatty meats. Deli meats. Chunky nut butters. Nuts and seeds. Beans and lentils. Berniece Salines. Hot dogs. Sausage. Dairy High-fat cheeses. Whole milk, chocolate milk, and beverages made with milk, such as milk shakes. Half-and-half. Cream. sour cream. Ice  cream. Beverages Caffeinated beverages (such as coffee, tea, soda, or energy drinks). Alcoholic beverages. Fruit juices with pulp. Prune juice. Soft drinks sweetened with high-fructose corn syrup or sugar alcohols. High-calorie sports drinks. Fats and oils Butter. Cream sauces. Margarine. Salad oils. Plain salad dressings. Olives. Avocados. Mayonnaise. Sweets and desserts Sweet rolls, doughnuts, and sweet breads. Sugar-free desserts sweetened with sugar alcohols such as xylitol and sorbitol. Seasoning and  other foods Honey. Hot sauce. Chili powder. Gravy. Cream-based or milk-based soups. Pancakes and waffles. Summary  When you have diarrhea, the foods you eat and your eating habits are very important.  Make sure you get at least 8-10 cups of fluid each day, or enough to keep your urine clear or pale yellow.  Eat bland foods and gradually reintroduce healthy, nutrient-rich foods as tolerated, or as told by your health care provider.  Avoid high-fiber, fried, greasy, or spicy foods. This information is not intended to replace advice given to you by your health care provider. Make sure you discuss any questions you have with your health care provider. Document Released: 06/11/2003 Document Revised: 03/18/2016 Document Reviewed: 03/18/2016 Elsevier Interactive Patient Education  2018 [employer].    Acute Kidney Injury, Adult Acute kidney injury is a sudden worsening of kidney function. The kidneys are organs that have several jobs. They filter the blood to remove waste products and extra fluid. They also maintain a healthy balance of minerals and hormones in the body, which helps control blood pressure and keep bones strong. With this condition, your kidneys do not do their jobs as well as they should. This condition ranges from mild to severe. Over time it may develop into long-lasting (chronic) kidney disease. Early detection and treatment may prevent acute kidney injury from developing into a chronic condition. What are the causes? Common causes of this condition include:  A problem with blood flow to the kidneys. This may be caused by: ? Low blood pressure (hypotension) or shock. ? Blood loss. ? Heart and blood vessel (cardiovascular) disease. ? Severe burns. ? Liver disease.  Direct damage to the kidneys. This may be caused by: ? Certain medicines. ? A kidney infection. ? Poisoning. ? Being around or in contact with toxic substances. ? A surgical wound. ? A hard, direct hit to  the kidney area.  A sudden blockage of urine flow. This may be caused by: ? Cancer. ? Kidney stones. ? An enlarged prostate in males.  What are the signs or symptoms? Symptoms of this condition may not be obvious until the condition becomes severe. Symptoms of this condition can include:  Tiredness (lethargy), or difficulty staying awake.  Nausea or vomiting.  Swelling (edema) of the face, legs, ankles, or feet.  Problems with urination, such as: ? Abdominal pain, or pain along the side of your stomach (flank). ? Decreased urine production. ? Decrease in the force of urine flow.  Muscle twitches and cramps, especially in the legs.  Confusion or trouble concentrating.  Loss of appetite.  Fever.  How is this diagnosed? This condition may be diagnosed with tests, including:  Blood tests.  Urine tests.  Imaging tests.  A test in which a sample of tissue is removed from the kidneys to be examined under a microscope (kidney biopsy).  How is this treated? Treatment for this condition depends on the cause and how severe the condition is. In mild cases, treatment may not be needed. The kidneys may heal on their own. In more severe cases, treatment will involve:  Treating the cause of the kidney injury. This may involve changing any medicines you are taking or adjusting your dosage.  Fluids. You may need specialized IV fluids to balance your body's needs.  Having a catheter placed to drain urine and prevent blockages.  Preventing problems from occurring. This may mean avoiding certain medicines or procedures that can cause further injury to the kidneys.  In some cases treatment may also require:  A procedure to remove toxic wastes from the body (dialysis or continuous renal replacement therapy - CRRT).  Surgery. This may be done to repair a torn kidney, or to remove the blockage from the urinary system.  Follow these instructions at home: Medicines  Take  over-the-counter and prescription medicines only as told by your health care provider.  Do not take any new medicines without your health care provider's approval. Many medicines can worsen your kidney damage.  Do not take any vitamin and mineral supplements without your health care provider's approval. Many nutritional supplements can worsen your kidney damage. Lifestyle  If your health care provider prescribed changes to your diet, follow them. You may need to decrease the amount of protein you eat.  Achieve and maintain a healthy weight. If you need help with this, ask your health care provider.  Start or continue an exercise plan. Try to exercise at least 30 minutes a day, 5 days a week.  Do not use any tobacco products, such as cigarettes, chewing tobacco, and e-cigarettes. If you need help quitting, ask your health care provider. General instructions  Keep track of your blood pressure. Report changes in your blood pressure as told by your health care provider.  Stay up to date with immunizations. Ask your health care provider which immunizations you need.  Keep all follow-up visits as told by your health care provider. This is important. Where to find more information:  American Association of Kidney Patients: BombTimer.gl  National Kidney Foundation: www.kidney.[place]: https://mathis.com/  [facility]: ? www.lifeoptions.org ? www.kidneyschool.org Contact a health care provider if:  Your symptoms get worse.  You develop new symptoms. Get help right away if:  You develop symptoms of worsening kidney disease, which include: ? Headaches. ? Abnormally dark or light skin. ? Easy bruising. ? Frequent hiccups. ? Chest pain. ? Shortness of breath. ? End of menstruation in women. ? Seizures. ? Confusion or altered mental status. ? Abdominal or back pain. ? Itchiness.  You have a fever.  Your body is producing less urine.  You  have pain or bleeding when you urinate. Summary  Acute kidney injury is a sudden worsening of kidney function.  Acute kidney injury can be caused by problems with blood flow to the kidneys, direct damage to the kidneys, and sudden blockage of urine flow.  Symptoms of this condition may not be obvious until it becomes severe. Symptoms may include edema, lethargy, confusion, nausea or vomiting, and problems passing urine.  This condition can usually be diagnosed with blood tests, urine tests, and imaging tests. Sometimes a kidney biopsy is done to diagnose this condition.  Treatment for this condition often involves treating the underlying cause. It is treated with fluids, medicines, dialysis, diet changes, or surgery. This information is not intended to replace advice given to you by your health care provider. Make sure you discuss any questions you have with your health care provider. Document Released: 10/04/2010 Document Revised: 07/21/2016 Document Reviewed: 03/11/2016 Elsevier Interactive Patient Education  [employer].

## 2018-02-08 NOTE — Discharge Summary (Signed)
Physician Discharge Summary  STEDMAN SUMMERVILLE JOA:[id number] DOB: May 15, 1965 DOA: 02/06/2018  PCP: Sinda Du, MD  Admit date: 02/06/2018 Discharge date: 02/08/2018  Admitted From:  Home  Disposition: Home  Recommendations for Outpatient Follow-up:  1. Follow up with PCP in 2 weeks 2. Please obtain BMP in 1-2 weeks to follow up renal function 3. Consider restarting losartan outpatient if renal function has stabilized   Discharge Condition: STABLE   CODE STATUS: FULL    Brief Hospitalization Summary: Please see all hospital notes, images, labs for full details of the hospitalization. HPI: [name] is a 52 y.o. male with medical history significant of schizophrenia, DM2, HTN, CKD stage 3.  Patient presents to the ED with c/o generalized weakness that has been progressively worsening over past few days.  He reports 2 weeks of diarrhea.  8 episodes per day, also vomiting at least once or twice a day as well.  ED Course: Creat 3.4 up from 1.9, BUN 30, CT abd / pelvis shows liquid stool in colon c/w diarrheal illness. C.Diff neg, GI pathogen pnl pending, given cipro.  IVF bolus.  Brief Narrative:  52 year old male with a history of hypertension, diabetes, chronic kidney disease stage III, schizophrenia, admitted to the hospital with generalized weakness and diarrhea.  He was noted to have dehydration and acute kidney injury on chronic kidney disease.  Admitted for IV fluids and supportive management.  Stool studies have been unrevealing.  Assessment & Plan:   Principal Problem:   Diarrhea Active Problems:   Hypertension   Schizophrenia ([place])   Diabetes mellitus, type 2 ([place])   Acute renal failure superimposed on stage 3 chronic kidney disease ([place])   1. Diarrhea.  resolved now.  C. difficile panel is negative.  GI pathogen panel also negative.  CT shows diarrheal illness.  Discontinue further antibiotics.  Suspect viral illness.  Treated supportively.  Overall  symptoms appear to be much improved. Pt tolerating diet. 2. Acute kidney injury on chronic kidney disease stage III.  Related to diarrhea.  This is improving with IV fluids.  Continue to hold losartan.  Treated with IV fluids.  Renal ultrasound does not show any acute process.  Creatinine improving.  Recheck BMP in 1-2 weeks with PCP.   3. Hypertension.  On metoprolol, clonidine, hydralazine and amlodipine.  Blood pressures are elevated since losartan has been held.  Increased metoprolol to 25 mg BID.  Follow up with PCP outpatient for recheck and adjustments.   4. Diabetes.  Continue on sliding scale insulin.  Blood sugar stable. 5. Schizophrenia.  Continued on home regimen.  DVT prophylaxis: Heparin Code Status: Full code Family Communication: No family present Disposition Plan: return to group home.   Procedures:     Antimicrobials:   Ceftriaxone 11/5 > 11/6  Flagyl 11/5 > 11/6  Discharge Diagnoses:  Principal Problem:   Diarrhea Active Problems:   Hypertension   Schizophrenia ([place])   Diabetes mellitus, type 2 ([place])   Acute renal failure superimposed on stage 3 chronic kidney disease ([place])  Discharge Instructions: Discharge Instructions    Call MD for:  difficulty breathing, headache or visual disturbances   Complete by:  As directed    Call MD for:  extreme fatigue   Complete by:  As directed    Call MD for:  persistant dizziness or light-headedness   Complete by:  As directed    Call MD for:  persistant nausea and vomiting   Complete by:  As directed  Diet - low sodium heart healthy   Complete by:  As directed    Increase activity slowly   Complete by:  As directed      Allergies as of 02/08/2018   No Known Allergies     Medication List    STOP taking these medications   losartan 25 MG tablet Commonly known as:  COZAAR     TAKE these medications   acetaminophen 500 MG tablet Commonly known as:  TYLENOL Take 1 tablet (500 mg total) by mouth every 6  (six) hours as needed.   amLODipine 10 MG tablet Commonly known as:  NORVASC Take 10 mg by mouth every morning.   aspirin EC 81 MG tablet Take 81 mg by mouth every morning.   clobetasol cream 0.05 % Commonly known as:  TEMOVATE Apply 1 application topically every morning. Applied to heels of feet   cloNIDine 0.3 mg/24hr patch Commonly known as:  CATAPRES - Dosed in mg/24 hr Place 1 patch (0.3 mg total) onto the skin every Thursday. What changed:    medication strength  See the new instructions.   DAILY VITE PO Take 1 tablet by mouth every morning.   diclofenac sodium 1 % Gel Commonly known as:  VOLTAREN Apply 2 g topically 4 (four) times daily.   fish oil-omega-3 fatty acids 1000 MG capsule Take 1 g by mouth 4 (four) times daily.   haloperidol decanoate 100 MG/ML injection Commonly known as:  HALDOL DECANOATE Inject 100 mg into the muscle every 14 (fourteen) days.   hydrALAZINE 100 MG tablet Commonly known as:  APRESOLINE Take 1 tablet (100 mg total) by mouth 3 (three) times daily. What changed:  See the new instructions.   insulin glargine 100 UNIT/ML injection Commonly known as:  LANTUS Inject 20 Units into the skin 2 (two) times daily. For blood sugar levels over 100. Do not administer for blood sugar levels lower than 100   ketoconazole 2 % cream Commonly known as:  NIZORAL Apply 1 application topically every morning. Applied to feet   metoprolol tartrate 25 MG tablet Commonly known as:  LOPRESSOR Take 1 tablet (25 mg total) by mouth 2 (two) times daily. What changed:  See the new instructions.   nitroGLYCERIN 0.4 MG SL tablet Commonly known as:  NITROSTAT Place 0.4 mg under the tongue every 5 (five) minutes as needed for chest pain.   potassium chloride 10 MEQ tablet Commonly known as:  K-DUR TAKE (1) TABLET BY MOUTH ONCE DAILY. What changed:  See the new instructions.   pravastatin 40 MG tablet Commonly known as:  PRAVACHOL Take 40 mg by mouth at  bedtime.   QC ANTI-DIARRHEAL 2 MG tablet Generic drug:  loperamide Take 2 mg by mouth 4 (four) times daily as needed for diarrhea or loose stools.   traZODone 50 MG tablet Commonly known as:  DESYREL Take 50 mg by mouth at bedtime.      Follow-up Information    Sinda Du, MD. Schedule an appointment as soon as possible for a visit in 2 week(s).   Specialty:  Pulmonary Disease Why:  Hospital Follow Up Contact information: [place] [address] [phone]        Herminio Commons, MD. Schedule an appointment as soon as possible for a visit in 2 week(s).   Specialty:  Cardiology Why:  Hospital Follow Up  Contact information: [place] [address] [phone]  No Known Allergies Allergies as of 02/08/2018   No Known Allergies     Medication List    STOP taking these medications   losartan 25 MG tablet Commonly known as:  COZAAR     TAKE these medications   acetaminophen 500 MG tablet Commonly known as:  TYLENOL Take 1 tablet (500 mg total) by mouth every 6 (six) hours as needed.   amLODipine 10 MG tablet Commonly known as:  NORVASC Take 10 mg by mouth every morning.   aspirin EC 81 MG tablet Take 81 mg by mouth every morning.   clobetasol cream 0.05 % Commonly known as:  TEMOVATE Apply 1 application topically every morning. Applied to heels of feet   cloNIDine 0.3 mg/24hr patch Commonly known as:  CATAPRES - Dosed in mg/24 hr Place 1 patch (0.3 mg total) onto the skin every Thursday. What changed:    medication strength  See the new instructions.   DAILY VITE PO Take 1 tablet by mouth every morning.   diclofenac sodium 1 % Gel Commonly known as:  VOLTAREN Apply 2 g topically 4 (four) times daily.   fish oil-omega-3 fatty acids 1000 MG capsule Take 1 g by mouth 4 (four) times daily.   haloperidol decanoate 100 MG/ML injection Commonly known as:  HALDOL DECANOATE Inject 100 mg  into the muscle every 14 (fourteen) days.   hydrALAZINE 100 MG tablet Commonly known as:  APRESOLINE Take 1 tablet (100 mg total) by mouth 3 (three) times daily. What changed:  See the new instructions.   insulin glargine 100 UNIT/ML injection Commonly known as:  LANTUS Inject 20 Units into the skin 2 (two) times daily. For blood sugar levels over 100. Do not administer for blood sugar levels lower than 100   ketoconazole 2 % cream Commonly known as:  NIZORAL Apply 1 application topically every morning. Applied to feet   metoprolol tartrate 25 MG tablet Commonly known as:  LOPRESSOR Take 1 tablet (25 mg total) by mouth 2 (two) times daily. What changed:  See the new instructions.   nitroGLYCERIN 0.4 MG SL tablet Commonly known as:  NITROSTAT Place 0.4 mg under the tongue every 5 (five) minutes as needed for chest pain.   potassium chloride 10 MEQ tablet Commonly known as:  K-DUR TAKE (1) TABLET BY MOUTH ONCE DAILY. What changed:  See the new instructions.   pravastatin 40 MG tablet Commonly known as:  PRAVACHOL Take 40 mg by mouth at bedtime.   QC ANTI-DIARRHEAL 2 MG tablet Generic drug:  loperamide Take 2 mg by mouth 4 (four) times daily as needed for diarrhea or loose stools.   traZODone 50 MG tablet Commonly known as:  DESYREL Take 50 mg by mouth at bedtime.       Procedures/Studies: Ct Abdomen Pelvis Wo Contrast  Result Date: 02/06/2018 CLINICAL DATA:  Diarrhea nausea EXAM: CT ABDOMEN AND PELVIS WITHOUT CONTRAST TECHNIQUE: Multidetector CT imaging of the abdomen and pelvis was performed following the standard protocol without IV contrast. COMPARISON:  CT 12/24/2006 FINDINGS: Lower chest: Lung bases demonstrate no acute consolidation or effusion. Borderline heart size. Hepatobiliary: No focal liver abnormality is seen. Slight increased density in the gallbladder may reflect tiny stones or small amount of sludge. No biliary dilatation Pancreas: Unremarkable. No  pancreatic ductal dilatation or surrounding inflammatory changes. Spleen: Normal in size without focal abnormality. Adrenals/Urinary Tract: Adrenal glands are unremarkable. Kidneys are normal, without renal calculi, focal lesion, or hydronephrosis. Bladder is unremarkable. Stomach/Bowel: The stomach is  nonenlarged. No dilated small bowel. Diffuse fluid in the colon. No colon wall thickening. Negative appendix Vascular/Lymphatic: Nonaneurysmal aorta. Mild aortic atherosclerosis. Subcentimeter mesenteric and retroperitoneal nodes. Reproductive: Prostate is unremarkable. Other: Negative for free air or free fluid. Musculoskeletal: Progressed arthritis of the bilateral hips, left greater than right with sclerosis and left acetabular cyst formation. IMPRESSION: 1. Fluid within the colon consistent with diarrheal illness. No focal wall thickening to suggest a colitis. Negative for obstruction or free air. Negative for appendicitis. 2. Possible small amount of gallbladder sludge or stone. Electronically Signed   By: Donavan Foil M.D.   On: 02/06/2018 19:52   US Renal  Result Date: 02/07/2018 CLINICAL DATA:  Acute kidney injury. EXAM: RENAL / URINARY TRACT ULTRASOUND COMPLETE COMPARISON:  CT 02/06/2018. FINDINGS: Right Kidney: Renal measurements: 10.1 x 5.7 x 6.1 cm = volume: 182.4 mL. Increased echogenicity. No mass or hydronephrosis visualized. Left Kidney: Renal measurements: 10.1 x 5.3 x 6.0 cm = volume: 166.2 mL. Increased echogenicity. No mass or hydronephrosis visualized. Bladder: Appears normal for degree of bladder distention. IMPRESSION: Increased echogenicity both kidneys. This is consistent chronic medical renal disease. No acute abnormality identified. No evidence of hydronephrosis. Electronically Signed   By: Marcello Moores  Register   On: 02/07/2018 12:28      Subjective: Pt says that he feels much better.  He is tolerating diet and diarrhea has resolved.  He says that he would like to go back to his group  home.    Discharge Exam: Vitals:   02/08/18 0700 02/08/18 0849  BP: (!) 180/70   Pulse: 72 62  Resp: 18   Temp: 98.1 F (36.7 C)   SpO2: 96%    Vitals:   02/07/18 2226 02/08/18 0013 02/08/18 0700 02/08/18 0849  BP: (!) 206/88 (!) 179/89 (!) 180/70   Pulse: 80  72 62  Resp: 18  18   Temp: 98.9 F (37.2 C)  98.1 F (36.7 C)   TempSrc: Oral  Oral   SpO2: 98%  96%   Weight:      Height:        General exam: Appears calm and comfortable  Respiratory system: Clear to auscultation. Respiratory effort normal. Cardiovascular system: S1 & S2 heard, RRR. No JVD, murmurs, rubs, gallops or clicks. No pedal edema. Gastrointestinal system: Abdomen is nondistended, soft and nontender. No organomegaly or masses felt. Normal bowel sounds heard. Central nervous system: Alert and oriented. No focal neurological deficits. Extremities: Symmetric 5 x 5 power. Skin: No rashes, lesions or ulcers Psychiatry: Judgement and insight appear normal. Mood & affect appropriate.    The results of significant diagnostics from this hospitalization (including imaging, microbiology, ancillary and laboratory) are listed below for reference.     Microbiology: Recent Results (from the past 240 hour(s))  Gastrointestinal Panel by PCR , Stool     Status: None   Collection Time: 02/06/18  3:35 PM  Result Value Ref Range Status   Campylobacter species NOT DETECTED NOT DETECTED Final   Plesimonas shigelloides NOT DETECTED NOT DETECTED Final   Salmonella species NOT DETECTED NOT DETECTED Final   Yersinia enterocolitica NOT DETECTED NOT DETECTED Final   Vibrio species NOT DETECTED NOT DETECTED Final   Vibrio cholerae NOT DETECTED NOT DETECTED Final   Enteroaggregative E coli (EAEC) NOT DETECTED NOT DETECTED Final   Enteropathogenic E coli (EPEC) NOT DETECTED NOT DETECTED Final   Enterotoxigenic E coli (ETEC) NOT DETECTED NOT DETECTED Final   Shiga like toxin producing E coli (STEC)  NOT DETECTED NOT DETECTED  Final   Shigella/Enteroinvasive E coli (EIEC) NOT DETECTED NOT DETECTED Final   Cryptosporidium NOT DETECTED NOT DETECTED Final   Cyclospora cayetanensis NOT DETECTED NOT DETECTED Final   Entamoeba histolytica NOT DETECTED NOT DETECTED Final   Giardia lamblia NOT DETECTED NOT DETECTED Final   Adenovirus F40/41 NOT DETECTED NOT DETECTED Final   Astrovirus NOT DETECTED NOT DETECTED Final   Norovirus GI/GII NOT DETECTED NOT DETECTED Final   Rotavirus A NOT DETECTED NOT DETECTED Final   Sapovirus (I, II, IV, and V) NOT DETECTED NOT DETECTED Final    Comment: Performed at [facility], [address] [place], [place] [address]  C difficile quick scan w PCR reflex     Status: None   Collection Time: 02/06/18  3:35 PM  Result Value Ref Range Status   C Diff antigen NEGATIVE NEGATIVE Final   C Diff toxin NEGATIVE NEGATIVE Final   C Diff interpretation No C. difficile detected.  Final    Comment: Performed at [facility], [address] [place],  [address]     Labs: BNP (last 3 results) No results for input(s): BNP in the last 8760 hours. Basic Metabolic Panel: Recent Labs  Lab 02/06/18 1543 02/06/18 2016 02/07/18 0516 02/08/18 0530  NA 139 142 140 145  K 3.6 3.7 3.6 3.8  CL 109 110 113* 120*  CO2 20*  --  18* 17*  GLUCOSE 97 86 98 90  BUN 35* 34* 26* 22*  CREATININE 3.40* 3.60* 2.80* 2.55*  CALCIUM 8.8*  --  8.5* 8.9  MG 1.8  --   --   --    Liver Function Tests: Recent Labs  Lab 02/06/18 1543  AST 13*  ALT 23  ALKPHOS 67  BILITOT 0.8  PROT 6.9  ALBUMIN 3.2*   Recent Labs  Lab 02/06/18 1543  LIPASE 24   No results for input(s): AMMONIA in the last 168 hours. CBC: Recent Labs  Lab 02/06/18 1543 02/06/18 2016 02/07/18 0516  WBC 6.6  --  6.8  NEUTROABS 4.2  --   --   HGB 12.0* 11.6* 10.6*  HCT 38.7* 34.0* 35.2*  MCV 86.0  --  87.8  PLT 199  --  187   Cardiac Enzymes: No results for input(s): CKTOTAL, CKMB, CKMBINDEX, TROPONINI in the  last 168 hours. BNP: Invalid input(s): POCBNP CBG: Recent Labs  Lab 02/07/18 0735 02/07/18 1117 02/07/18 1643 02/07/18 2225 02/08/18 0746  GLUCAP 82 85 88 84 97   D-Dimer No results for input(s): DDIMER in the last 72 hours. Hgb A1c No results for input(s): HGBA1C in the last 72 hours. Lipid Profile No results for input(s): CHOL, HDL, LDLCALC, TRIG, CHOLHDL, LDLDIRECT in the last 72 hours. Thyroid function studies No results for input(s): TSH, T4TOTAL, T3FREE, THYROIDAB in the last 72 hours.  Invalid input(s): FREET3 Anemia work up No results for input(s): VITAMINB12, FOLATE, FERRITIN, TIBC, IRON, RETICCTPCT in the last 72 hours. Urinalysis    Component Value Date/Time   COLORURINE YELLOW 02/06/2018 1535   APPEARANCEUR CLEAR 02/06/2018 1535   LABSPEC 1.015 02/06/2018 1535   PHURINE 5.0 02/06/2018 1535   GLUCOSEU NEGATIVE 02/06/2018 1535   HGBUR NEGATIVE 02/06/2018 1535   BILIRUBINUR NEGATIVE 02/06/2018 1535   KETONESUR NEGATIVE 02/06/2018 1535   PROTEINUR 100 (A) 02/06/2018 1535   UROBILINOGEN 0.2 12/17/2012 1644   NITRITE NEGATIVE 02/06/2018 1535   LEUKOCYTESUR NEGATIVE 02/06/2018 1535   Sepsis Labs Invalid input(s): PROCALCITONIN,  WBC,  [place] Microbiology Recent Results (from the past 240 hour(s))  Gastrointestinal Panel by PCR , Stool     Status: None   Collection Time: 02/06/18  3:35 PM  Result Value Ref Range Status   Campylobacter species NOT DETECTED NOT DETECTED Final   Plesimonas shigelloides NOT DETECTED NOT DETECTED Final   Salmonella species NOT DETECTED NOT DETECTED Final   Yersinia enterocolitica NOT DETECTED NOT DETECTED Final   Vibrio species NOT DETECTED NOT DETECTED Final   Vibrio cholerae NOT DETECTED NOT DETECTED Final   Enteroaggregative E coli (EAEC) NOT DETECTED NOT DETECTED Final   Enteropathogenic E coli (EPEC) NOT DETECTED NOT DETECTED Final   Enterotoxigenic E coli (ETEC) NOT DETECTED NOT DETECTED Final   Shiga like toxin  producing E coli (STEC) NOT DETECTED NOT DETECTED Final   Shigella/Enteroinvasive E coli (EIEC) NOT DETECTED NOT DETECTED Final   Cryptosporidium NOT DETECTED NOT DETECTED Final   Cyclospora cayetanensis NOT DETECTED NOT DETECTED Final   Entamoeba histolytica NOT DETECTED NOT DETECTED Final   Giardia lamblia NOT DETECTED NOT DETECTED Final   Adenovirus F40/41 NOT DETECTED NOT DETECTED Final   Astrovirus NOT DETECTED NOT DETECTED Final   Norovirus GI/GII NOT DETECTED NOT DETECTED Final   Rotavirus A NOT DETECTED NOT DETECTED Final   Sapovirus (I, II, IV, and V) NOT DETECTED NOT DETECTED Final    Comment: Performed at [facility], [address] [place], [place] [address]  C difficile quick scan w PCR reflex     Status: None   Collection Time: 02/06/18  3:35 PM  Result Value Ref Range Status   C Diff antigen NEGATIVE NEGATIVE Final   C Diff toxin NEGATIVE NEGATIVE Final   C Diff interpretation No C. difficile detected.  Final    Comment: Performed at [facility], [address] [place], [place] [address]   Time coordinating discharge: 34 minutes  SIGNED:  Irwin Brakeman, MD  [facility] 02/08/2018, 9:50 AM Pager [phone]  If 7PM-7AM, please contact night-coverage [email or web address] Password [email or web address]

## 2018-02-08 NOTE — NC FL2 (Signed)
[place] LEVEL OF CARE SCREENING TOOL     IDENTIFICATION  Patient Name: [name] Birthdate: [birth year] Sex: male Admission Date (Current Location): 02/06/2018  [place] and [place] Number:  [place] [facility] [id number] [place] and Address:  [facility] [address] [place]      Provider Number: [id number]  Attending Physician Name and Address:  Murlean Iba, MD  Relative Name and Phone Number:       Current Level of Care: Hospital Recommended Level of Care: [facility] Prior Approval Number:    Date Approved/Denied:   PASRR Number:    Discharge Plan: Domiciliary (Rest home)    Current Diagnoses: Patient Active Problem List   Diagnosis Date Noted  . Acute renal failure superimposed on stage 3 chronic kidney disease ([place]) 02/06/2018  . Acute renal failure ([place]) 12/17/2012  . Dehydration 12/17/2012  . Diarrhea 12/17/2012  . Gastroenteritis 12/17/2012  . GERD (gastroesophageal reflux disease) 12/17/2012  . Metabolic acidosis 32/20/2542  . Chronic kidney disease, stage 2, mildly decreased GFR 03/19/2012  . Arteriosclerotic cardiovascular disease (ASCVD)   . Schizophrenia ([place])   . Tobacco abuse   . Diabetes mellitus, type 2 ([place])   . Hypertension   . Hyperlipidemia   . Aortic insufficiency     Orientation RESPIRATION BLADDER Height & Weight     Self, Place, Situation, Time  Normal Continent Weight: 104 lb (47.2 kg) Height:  5' 8.5" (174 cm)  BEHAVIORAL SYMPTOMS/MOOD NEUROLOGICAL BOWEL NUTRITION STATUS      Continent (low sodium heart healthy)  AMBULATORY STATUS COMMUNICATION OF NEEDS Skin   Supervision Verbally Normal                       Personal Care Assistance Level of Assistance  Bathing, Feeding, Dressing Bathing Assistance: Limited assistance Feeding assistance: Independent Dressing Assistance: Limited assistance     Functional Limitations Info  Sight, Hearing, Speech Sight Info:  Adequate Hearing Info: Adequate Speech Info: Adequate    SPECIAL CARE FACTORS FREQUENCY                       Contractures Contractures Info: Not present    Additional Factors Info  Code Status, Allergies, Psychotropic Code Status Info: Full Code Allergies Info: NKA Psychotropic Info: Desyrel, Haldol Deconoate         Current Medications (02/08/2018):  This is the current hospital active medication list Current Facility-Administered Medications  Medication Dose Route Frequency Provider Last Rate Last Dose  . 0.9 %  sodium chloride infusion   Intravenous Continuous Etta Quill, DO 125 mL/hr at 02/08/18 0841    . acetaminophen (TYLENOL) tablet 650 mg  650 mg Oral Q6H PRN Etta Quill, DO       Or  . acetaminophen (TYLENOL) suppository 650 mg  650 mg Rectal Q6H PRN Etta Quill, DO      . amLODipine (NORVASC) tablet 10 mg  10 mg Oral q morning - 10a Etta Quill, DO   10 mg at 02/08/18 0853  . aspirin EC tablet 81 mg  81 mg Oral q morning - 10a Etta Quill, DO   81 mg at 02/08/18 7062  . clobetasol ointment (TEMOVATE) 0.05 %   Topical BID Kathie Dike, MD      . cloNIDine (CATAPRES - Dosed in mg/24 hr) patch 0.3 mg  0.3 mg Transdermal Q Cathrine Muster, Jared M, DO   0.3  mg at 02/08/18 0853  . heparin injection 5,000 Units  5,000 Units Subcutaneous Q8H Etta Quill, DO   5,000 Units at 02/08/18 0559  . hydrALAZINE (APRESOLINE) tablet 100 mg  100 mg Oral TID Etta Quill, DO   100 mg at 02/08/18 0853  . insulin aspart (novoLOG) injection 0-9 Units  0-9 Units Subcutaneous TID WC Etta Quill, DO      . loperamide (IMODIUM) capsule 2 mg  2 mg Oral QID PRN Etta Quill, DO      . metoprolol tartrate (LOPRESSOR) tablet 25 mg  25 mg Oral q morning - 10a Kathie Dike, MD   25 mg at 02/08/18 0853  . omega-3 acid ethyl esters (LOVAZA) capsule 1 g  1 g Oral QID Jennette Kettle M, DO   1 g at 02/08/18 3244  . ondansetron (ZOFRAN) tablet 4 mg  4 mg  Oral Q6H PRN Etta Quill, DO       Or  . ondansetron [facility]) injection 4 mg  4 mg Intravenous Q6H PRN Etta Quill, DO      . traZODone (DESYREL) tablet 50 mg  50 mg Oral QHS Jennette Kettle M, DO   50 mg at 02/07/18 2251     Discharge Medications: Medication List    STOP taking these medications   losartan 25 MG tablet Commonly known as:  COZAAR     TAKE these medications   acetaminophen 500 MG tablet Commonly known as:  TYLENOL Take 1 tablet (500 mg total) by mouth every 6 (six) hours as needed.   amLODipine 10 MG tablet Commonly known as:  NORVASC Take 10 mg by mouth every morning.   aspirin EC 81 MG tablet Take 81 mg by mouth every morning.   clobetasol cream 0.05 % Commonly known as:  TEMOVATE Apply 1 application topically every morning. Applied to heels of feet   cloNIDine 0.3 mg/24hr patch Commonly known as:  CATAPRES - Dosed in mg/24 hr Place 1 patch (0.3 mg total) onto the skin every Thursday. What changed:    medication strength  See the new instructions.   DAILY VITE PO Take 1 tablet by mouth every morning.   diclofenac sodium 1 % Gel Commonly known as:  VOLTAREN Apply 2 g topically 4 (four) times daily.   fish oil-omega-3 fatty acids 1000 MG capsule Take 1 g by mouth 4 (four) times daily.   haloperidol decanoate 100 MG/ML injection Commonly known as:  HALDOL DECANOATE Inject 100 mg into the muscle every 14 (fourteen) days.   hydrALAZINE 100 MG tablet Commonly known as:  APRESOLINE Take 1 tablet (100 mg total) by mouth 3 (three) times daily. What changed:  See the new instructions.   insulin glargine 100 UNIT/ML injection Commonly known as:  LANTUS Inject 20 Units into the skin 2 (two) times daily. For blood sugar levels over 100. Do not administer for blood sugar levels lower than 100   ketoconazole 2 % cream Commonly known as:  NIZORAL Apply 1 application topically every morning. Applied to feet   metoprolol  tartrate 25 MG tablet Commonly known as:  LOPRESSOR Take 1 tablet (25 mg total) by mouth 2 (two) times daily. What changed:  See the new instructions.   nitroGLYCERIN 0.4 MG SL tablet Commonly known as:  NITROSTAT Place 0.4 mg under the tongue every 5 (five) minutes as needed for chest pain.   potassium chloride 10 MEQ tablet Commonly known as:  K-DUR TAKE (1) TABLET BY  MOUTH ONCE DAILY. What changed:  See the new instructions.   pravastatin 40 MG tablet Commonly known as:  PRAVACHOL Take 40 mg by mouth at bedtime.   QC ANTI-DIARRHEAL 2 MG tablet Generic drug:  loperamide Take 2 mg by mouth 4 (four) times daily as needed for diarrhea or loose stools.   traZODone 50 MG tablet Commonly known as:  DESYREL Take 50 mg by mouth at bedtime.    Relevant Imaging Results:  Relevant Lab Results:   Additional Information    Yanel Dombrosky, Clydene Pugh, LCSW

## 2018-02-13 ENCOUNTER — Ambulatory Visit (INDEPENDENT_AMBULATORY_CARE_PROVIDER_SITE_OTHER): Payer: Medicaid Other | Admitting: Cardiovascular Disease

## 2018-02-13 ENCOUNTER — Encounter: Payer: Self-pay | Admitting: Cardiovascular Disease

## 2018-02-13 VITALS — BP 155/74 | HR 72 | Ht 68.5 in | Wt 230.0 lb

## 2018-02-13 DIAGNOSIS — I351 Nonrheumatic aortic (valve) insufficiency: Secondary | ICD-10-CM | POA: Diagnosis not present

## 2018-02-13 DIAGNOSIS — Z9289 Personal history of other medical treatment: Secondary | ICD-10-CM

## 2018-02-13 DIAGNOSIS — I25118 Atherosclerotic heart disease of native coronary artery with other forms of angina pectoris: Secondary | ICD-10-CM | POA: Diagnosis not present

## 2018-02-13 DIAGNOSIS — E785 Hyperlipidemia, unspecified: Secondary | ICD-10-CM | POA: Diagnosis not present

## 2018-02-13 DIAGNOSIS — I10 Essential (primary) hypertension: Secondary | ICD-10-CM | POA: Diagnosis not present

## 2018-02-13 DIAGNOSIS — N183 Chronic kidney disease, stage 3 unspecified: Secondary | ICD-10-CM

## 2018-02-13 DIAGNOSIS — R197 Diarrhea, unspecified: Secondary | ICD-10-CM

## 2018-02-13 MED ORDER — CARVEDILOL 3.125 MG PO TABS: 3.1250 mg | ORAL_TABLET | Freq: Two times a day (BID) | ORAL | 4 refills | Status: DC

## 2018-02-13 NOTE — Progress Notes (Signed)
SUBJECTIVE: The patient presents for follow-up after being hospitalized for diarrhea with consequent acute on chronic kidney disease stage III.  He was given IV fluids.  GI panel was negative. Losartan was held.  BUN and creatinine were 22 and 2.55 respectively on 02/08/2018.  I have not seen him since 12/15/2016.  He has a history of coronary artery disease having sustained a non-STEMI with a bare-metal stent to the LAD in April 2010. He also has essential hypertension, hyperlipidemia, insulin-dependent diabetes mellitus, and a history of tobacco abuse.   The patient denies any symptoms of chest pain, palpitations, shortness of breath, lightheadedness, dizziness, leg swelling, orthopnea, PND, and syncope.  He has had one episode of diarrhea today.  He has some abdominal soreness.  He is in good spirits.  ECG performed in the office today which I ordered and personally interpreted demonstrates normal sinus rhythm with LVH and repolarization abnormalities.   Review of Systems: As per "subjective", otherwise negative.  No Known Allergies  Current Outpatient Medications  Medication Sig Dispense Refill  . acetaminophen (TYLENOL) 500 MG tablet Take 1 tablet (500 mg total) by mouth every 6 (six) hours as needed. 30 tablet 0  . amLODipine (NORVASC) 10 MG tablet Take 10 mg by mouth every morning.     Marland Kitchen aspirin EC 81 MG tablet Take 81 mg by mouth every morning.     . clobetasol cream (TEMOVATE) 8.50 % Apply 1 application topically every morning. Applied to heels of feet    . cloNIDine (CATAPRES-TTS-3) 0.3 mg/24hr patch Place 1 patch (0.3 mg total) onto the skin every Thursday.    . diclofenac sodium (VOLTAREN) 1 % GEL Apply 2 g topically 4 (four) times daily.    . fish oil-omega-3 fatty acids 1000 MG capsule Take 1 g by mouth 4 (four) times daily.      . haloperidol decanoate (HALDOL DECANOATE) 100 MG/ML injection Inject 100 mg into the muscle every 14 (fourteen) days.      . hydrALAZINE  (APRESOLINE) 100 MG tablet Take 1 tablet (100 mg total) by mouth 3 (three) times daily.    . insulin glargine (LANTUS) 100 UNIT/ML injection Inject 20 Units into the skin 2 (two) times daily. For blood sugar levels over 100. Do not administer for blood sugar levels lower than 100    . ketoconazole (NIZORAL) 2 % cream Apply 1 application topically every morning. Applied to feet    . loperamide (QC ANTI-DIARRHEAL) 2 MG tablet Take 2 mg by mouth 4 (four) times daily as needed for diarrhea or loose stools.    . metoprolol tartrate (LOPRESSOR) 25 MG tablet Take 1 tablet (25 mg total) by mouth 2 (two) times daily. 45 tablet 3  . Multiple Vitamin (DAILY VITE PO) Take 1 tablet by mouth every morning.     . nitroGLYCERIN (NITROSTAT) 0.4 MG SL tablet Place 0.4 mg under the tongue every 5 (five) minutes as needed for chest pain.    . potassium chloride (K-DUR) 10 MEQ tablet TAKE (1) TABLET BY MOUTH ONCE DAILY. (Patient taking differently: Take 10 mEq by mouth every morning. ) 90 tablet 3  . pravastatin (PRAVACHOL) 40 MG tablet Take 40 mg by mouth at bedtime.     . traZODone (DESYREL) 50 MG tablet Take 50 mg by mouth at bedtime.     No current facility-administered medications for this visit.     Past Medical History:  Diagnosis Date  . Aortic insufficiency    mild  to moderate   . Arteriosclerotic cardiovascular disease (ASCVD)    EF 60% -- Non-Q MI in 07/2008->  BMS to mid LAD  . Axillary abscess 2011   right  . Cerebrovascular disease    CVA  . Chronic obstructive pulmonary disease ([place])   . Diabetes mellitus type II   . Diabetes mellitus, type 2 (HCC)    A1c of 5.6 in 05/2011  . Hyperlipidemia   . Hypertension   . Mild anemia    Result.  Nl H&H in 02/2011  . Obesity   . Schizophrenia ([place])    Schizophrenia/bipolar disease/mental retardation  . Sinus bradycardia    On low dose beta blocker  . Tobacco abuse    15 pack years; 0.5 pack per day    Past Surgical History:  Procedure  Laterality Date  . HERNIA REPAIR  1967    Social History   Socioeconomic History  . Marital status: Single    Spouse name: Not on file  . Number of children: Not on file  . Years of education: Not on file  . Highest education level: Not on file  Occupational History  . Occupation: Disabled  Social Needs  . Financial resource strain: Not on file  . Food insecurity:    Worry: Not on file    Inability: Not on file  . Transportation needs:    Medical: Not on file    Non-medical: Not on file  Tobacco Use  . Smoking status: Current Some Day Smoker    Packs/day: 0.50    Years: 30.00    Pack years: 15.00    Types: Cigarettes    Start date: 12/03/1974  . Smokeless tobacco: Never Used  . Tobacco comment: Patient is not motivated to quit.  He attributes his smoking to the lack of other activities.  Substance and Sexual Activity  . Alcohol use: No    Alcohol/week: 0.0 standard drinks  . Drug use: No  . Sexual activity: Never  Lifestyle  . Physical activity:    Days per week: Not on file    Minutes per session: Not on file  . Stress: Not on file  Relationships  . Social connections:    Talks on phone: Not on file    Gets together: Not on file    Attends religious service: Not on file    Active member of club or organization: Not on file    Attends meetings of clubs or organizations: Not on file    Relationship status: Not on file  . Intimate partner violence:    Fear of current or ex partner: Not on file    Emotionally abused: Not on file    Physically abused: Not on file    Forced sexual activity: Not on file  Other Topics Concern  . Not on file  Social History Narrative   Resides in an assisted-living facility     Vitals:   02/13/18 1330  BP: (!) 155/74  Pulse: 72  SpO2: 98%  Weight: 230 lb (104.3 kg)  Height: 5' 8.5" (1.74 m)    Wt Readings from Last 3 Encounters:  02/13/18 230 lb (104.3 kg)  02/06/18 104 lb (47.2 kg)  12/15/16 245 lb (111.1 kg)      PHYSICAL EXAM General: NAD HEENT: Normal. Neck: No JVD, no thyromegaly. Lungs: Clear to auscultation bilaterally with normal respiratory effort. CV: Regular rate and rhythm, normal S1/S2, no G6/Y4, soft systolic murmur over right upper sternal border. No pretibial or periankle  edema.  No carotid bruit.   Abdomen: Soft, nontender, no distention.  Neurologic: Alert and oriented.  Psych: Normal affect. Skin: Normal. Musculoskeletal: No gross deformities.    ECG: Reviewed above under Subjective   Labs: Lab Results  Component Value Date/Time   K 3.8 02/08/2018 05:30 AM   BUN 22 (H) 02/08/2018 05:30 AM   BUN 14 12/20/2016 10:24 AM   CREATININE 2.55 (H) 02/08/2018 05:30 AM   CREATININE 1.93 (H) 02/15/2017 10:19 AM   ALT 23 02/06/2018 03:43 PM   TSH 2.268 04/11/2012 01:45 PM   HGB 10.6 (L) 02/07/2018 05:16 AM     Lipids: Lab Results  Component Value Date/Time   LDLCALC 78 12/26/2017 09:12 AM   CHOL 136 12/26/2017 09:12 AM   TRIG 136 12/26/2017 09:12 AM   HDL 31 (L) 12/26/2017 09:12 AM       ASSESSMENT AND PLAN: 1. CAD: Stable ischemic heart disease.  Currently on aspirin, pravastatin, and metoprolol. I will switch Lopressor to carvedilol 3.125 mg twice daily for more optimal blood pressure control.  2. Essential HTN: Elevated on amlodipine 10 mg, clonidine patch 0.3 mg, and hydralazine 100 mg 3 times daily. I will switch Lopressor to carvedilol 3.125 mg twice daily for more optimal blood pressure control.  3. Hyperlipidemia: Lipids reviewed above. Continue pravastatin 40 mg.  4.  Chronic kidney disease stage III: Labs reviewed above.  I recommended adequate fluid consumption with electrolyte supplementation.  5. Aortic regurgitation: Mild in February 2014.  I will obtain a follow-up echocardiogram.  6.  Diarrhea: This is resolving.  One episode today. I recommended adequate fluid consumption with electrolyte supplementation.    Disposition: Follow up 1  year.   Kate Sable, M.D., F.A.C.C.

## 2018-02-13 NOTE — Patient Instructions (Signed)
Medication Instructions:  STOP Lopressor  START Coreg 3.125 mg twice a day  If you need a refill on your cardiac medications before your next appointment, please call your pharmacy.   Lab work: None If you have labs (blood work) drawn today and your tests are completely normal, you will receive your results only by: Marland Kitchen MyChart Message (if you have MyChart) OR . A paper copy in the mail If you have any lab test that is abnormal or we need to change your treatment, we will call you to review the results.  Testing/Procedures: Your physician has requested that you have an echocardiogram. Echocardiography is a painless test that uses sound waves to create images of your heart. It provides your doctor with information about the size and shape of your heart and how well your heart's chambers and valves are working. This procedure takes approximately one hour. There are no restrictions for this procedure.    Follow-Up: At [facility], you and your health needs are our priority.  As part of our continuing mission to provide you with exceptional heart care, we have created designated Provider Care Teams.  These Care Teams include your primary Cardiologist (physician) and Advanced Practice Providers (APPs -  Physician Assistants and Nurse Practitioners) who all work together to provide you with the care you need, when you need it. You will need a follow up appointment in 1 years.  Please call our office 2 months in advance to schedule this appointment.  You may see Kate Sable, MD or one of the following Advanced Practice Providers on your designated Care Team:   Bernerd Pho, PA-C [facility]) . Ermalinda Barrios, PA-C ([place])  Any Other Special Instructions Will Be Listed Below (If Applicable). NONE

## 2018-02-20 ENCOUNTER — Ambulatory Visit (HOSPITAL_COMMUNITY)
Admission: RE | Admit: 2018-02-20 | Discharge: 2018-02-20 | Disposition: A | Discharge status: Home / Self Care | Payer: Medicaid Other | Source: Ambulatory Visit | Attending: Cardiovascular Disease | Admitting: Cardiovascular Disease

## 2018-02-20 DIAGNOSIS — I351 Nonrheumatic aortic (valve) insufficiency: Secondary | ICD-10-CM | POA: Insufficient documentation

## 2018-02-20 NOTE — Progress Notes (Signed)
*  PRELIMINARY RESULTS* Echocardiogram 2D Echocardiogram has been performed.  [name] 02/20/2018, 11:01 AM

## 2018-02-26 ENCOUNTER — Encounter: Payer: Self-pay | Admitting: Internal Medicine

## 2018-03-07 ENCOUNTER — Other Ambulatory Visit: Payer: Self-pay | Admitting: Cardiovascular Disease

## 2018-03-14 ENCOUNTER — Ambulatory Visit: Payer: Medicaid Other | Admitting: Cardiovascular Disease

## 2018-04-09 ENCOUNTER — Other Ambulatory Visit: Payer: Self-pay | Admitting: Cardiovascular Disease

## 2018-04-13 ENCOUNTER — Encounter (HOSPITAL_COMMUNITY): Admission: RE | Admit: 2018-04-13 | Payer: Medicaid Other | Source: Ambulatory Visit

## 2018-04-18 ENCOUNTER — Encounter (HOSPITAL_COMMUNITY)
Admission: RE | Admit: 2018-04-18 | Discharge: 2018-04-18 | Disposition: A | Discharge status: Home / Self Care | Payer: Medicaid Other | Source: Ambulatory Visit | Attending: Nephrology | Admitting: Nephrology

## 2018-04-18 DIAGNOSIS — D509 Iron deficiency anemia, unspecified: Secondary | ICD-10-CM | POA: Diagnosis present

## 2018-04-18 MED ORDER — SODIUM CHLORIDE 0.9 % IV SOLN
INTRAVENOUS | Status: DC
  Administered 2018-04-18: 15:00:00 via INTRAVENOUS

## 2018-04-18 MED ORDER — SODIUM CHLORIDE 0.9 % IV SOLN
510.0000 mg | Freq: Once | INTRAVENOUS | Status: AC
  Administered 2018-04-18: 510 mg via INTRAVENOUS
  Filled 2018-04-18: qty 17

## 2018-04-25 ENCOUNTER — Encounter (HOSPITAL_COMMUNITY)
Admission: RE | Admit: 2018-04-25 | Discharge: 2018-04-25 | Disposition: A | Discharge status: Home / Self Care | Payer: Medicaid Other | Source: Ambulatory Visit | Attending: Nephrology | Admitting: Nephrology

## 2018-04-25 ENCOUNTER — Encounter (HOSPITAL_COMMUNITY): Payer: Self-pay

## 2018-04-25 DIAGNOSIS — D509 Iron deficiency anemia, unspecified: Secondary | ICD-10-CM | POA: Diagnosis not present

## 2018-04-25 MED ORDER — SODIUM CHLORIDE 0.9 % IV SOLN
Freq: Once | INTRAVENOUS | Status: AC
  Administered 2018-04-25: 09:00:00 via INTRAVENOUS

## 2018-04-25 MED ORDER — SODIUM CHLORIDE 0.9 % IV SOLN
510.0000 mg | Freq: Once | INTRAVENOUS | Status: AC
  Administered 2018-04-25: 510 mg via INTRAVENOUS
  Filled 2018-04-25: qty 510

## 2018-05-17 ENCOUNTER — Ambulatory Visit (HOSPITAL_COMMUNITY)
Admission: RE | Admit: 2018-05-17 | Discharge: 2018-05-17 | Disposition: A | Discharge status: Home / Self Care | Payer: Medicaid Other | Source: Ambulatory Visit | Attending: Pulmonary Disease | Admitting: Pulmonary Disease

## 2018-05-17 ENCOUNTER — Other Ambulatory Visit (HOSPITAL_COMMUNITY): Payer: Self-pay | Admitting: Pulmonary Disease

## 2018-05-17 DIAGNOSIS — M25562 Pain in left knee: Secondary | ICD-10-CM | POA: Insufficient documentation

## 2018-05-17 DIAGNOSIS — M25552 Pain in left hip: Secondary | ICD-10-CM

## 2018-05-17 DIAGNOSIS — M79672 Pain in left foot: Secondary | ICD-10-CM | POA: Diagnosis present

## 2018-05-23 ENCOUNTER — Ambulatory Visit: Payer: Medicaid Other | Admitting: Nurse Practitioner

## 2018-05-23 ENCOUNTER — Telehealth: Payer: Self-pay | Admitting: Nurse Practitioner

## 2018-05-23 ENCOUNTER — Encounter: Payer: Self-pay | Admitting: Internal Medicine

## 2018-05-23 NOTE — Progress Notes (Deleted)
Primary Care Physician:  Sinda Du, MD Primary Gastroenterologist:  Dr. Gala Romney  No chief complaint on file.   HPI:   [name] is a 53 y.o. male who presents on referral from primary care for vomiting and diarrhea.  The patient has not been seen by our office since 2008.  Reviewed information provided with the referral including ***.  No history of colonoscopy or endoscopy in our system.  Today he states   Past Medical History:  Diagnosis Date  . Aortic insufficiency    mild to moderate   . Arteriosclerotic cardiovascular disease (ASCVD)    EF 60% -- Non-Q MI in 07/2008->  BMS to mid LAD  . Axillary abscess 2011   right  . Cerebrovascular disease    CVA  . Chronic obstructive pulmonary disease ([place])   . Diabetes mellitus type II   . Diabetes mellitus, type 2 (HCC)    A1c of 5.6 in 05/2011  . Hyperlipidemia   . Hypertension   . Mild anemia    Result.  Nl H&H in 02/2011  . Obesity   . Schizophrenia ([place])    Schizophrenia/bipolar disease/mental retardation  . Sinus bradycardia    On low dose beta blocker  . Tobacco abuse    15 pack years; 0.5 pack per day    Past Surgical History:  Procedure Laterality Date  . HERNIA REPAIR  1967    Current Outpatient Medications  Medication Sig Dispense Refill  . acetaminophen (TYLENOL) 500 MG tablet Take 1 tablet (500 mg total) by mouth every 6 (six) hours as needed. 30 tablet 0  . amLODipine (NORVASC) 10 MG tablet Take 10 mg by mouth every morning.     Marland Kitchen aspirin EC 81 MG tablet Take 81 mg by mouth every morning.     . carvedilol (COREG) 3.125 MG tablet Take 1 tablet (3.125 mg total) by mouth 2 (two) times daily with a meal. 180 tablet 4  . CATAPRES-TTS-3 0.3 MG/24HR patch PLACE 1 PATCH ONTO SKIN ONCE A WEEK. REMOVE OLD PATCH. 4 patch 6  . clobetasol cream (TEMOVATE) 2.20 % Apply 1 application topically every morning. Applied to heels of feet    . diclofenac sodium (VOLTAREN) 1 % GEL Apply 2 g topically 4 (four)  times daily.    . fish oil-omega-3 fatty acids 1000 MG capsule Take 1 g by mouth 4 (four) times daily.      . haloperidol decanoate (HALDOL DECANOATE) 100 MG/ML injection Inject 100 mg into the muscle every 14 (fourteen) days.      . hydrALAZINE (APRESOLINE) 100 MG tablet TAKE (1) TABLET BY MOUTH (3) TIMES DAILY. 90 tablet 6  . insulin glargine (LANTUS) 100 UNIT/ML injection Inject 20 Units into the skin 2 (two) times daily. For blood sugar levels over 100. Do not administer for blood sugar levels lower than 100    . ketoconazole (NIZORAL) 2 % cream Apply 1 application topically every morning. Applied to feet    . loperamide (QC ANTI-DIARRHEAL) 2 MG tablet Take 2 mg by mouth 4 (four) times daily as needed for diarrhea or loose stools.    . Multiple Vitamin (DAILY VITE PO) Take 1 tablet by mouth every morning.     . nitroGLYCERIN (NITROSTAT) 0.4 MG SL tablet Place 0.4 mg under the tongue every 5 (five) minutes as needed for chest pain.    . potassium chloride (K-DUR) 10 MEQ tablet TAKE (1) TABLET BY MOUTH ONCE DAILY. 30 tablet 6  .  pravastatin (PRAVACHOL) 40 MG tablet Take 40 mg by mouth at bedtime.     . traZODone (DESYREL) 50 MG tablet Take 50 mg by mouth at bedtime.     No current facility-administered medications for this visit.     Allergies as of 05/23/2018  . (No Known Allergies)    Family History  Problem Relation Age of Onset  . Hypertension Other   . Arthritis Other     Social History   Socioeconomic History  . Marital status: Single    Spouse name: Not on file  . Number of children: Not on file  . Years of education: Not on file  . Highest education level: Not on file  Occupational History  . Occupation: Disabled  Social Needs  . Financial resource strain: Not on file  . Food insecurity:    Worry: Not on file    Inability: Not on file  . Transportation needs:    Medical: Not on file    Non-medical: Not on file  Tobacco Use  . Smoking status: Current Some Day  Smoker    Packs/day: 0.50    Years: 30.00    Pack years: 15.00    Types: Cigarettes    Start date: 12/03/1974  . Smokeless tobacco: Never Used  . Tobacco comment: Patient is not motivated to quit.  He attributes his smoking to the lack of other activities.  Substance and Sexual Activity  . Alcohol use: No    Alcohol/week: 0.0 standard drinks  . Drug use: No  . Sexual activity: Never  Lifestyle  . Physical activity:    Days per week: Not on file    Minutes per session: Not on file  . Stress: Not on file  Relationships  . Social connections:    Talks on phone: Not on file    Gets together: Not on file    Attends religious service: Not on file    Active member of club or organization: Not on file    Attends meetings of clubs or organizations: Not on file    Relationship status: Not on file  . Intimate partner violence:    Fear of current or ex partner: Not on file    Emotionally abused: Not on file    Physically abused: Not on file    Forced sexual activity: Not on file  Other Topics Concern  . Not on file  Social History Narrative   Resides in an assisted-living facility    Review of Systems: General: Negative for anorexia, weight loss, fever, chills, fatigue, weakness. Eyes: Negative for vision changes.  ENT: Negative for hoarseness, difficulty swallowing , nasal congestion. CV: Negative for chest pain, angina, palpitations, dyspnea on exertion, peripheral edema.  Respiratory: Negative for dyspnea at rest, dyspnea on exertion, cough, sputum, wheezing.  GI: See history of present illness. GU:  Negative for dysuria, hematuria, urinary incontinence, urinary frequency, nocturnal urination.  MS: Negative for joint pain, low back pain.  Derm: Negative for rash or itching.  Neuro: Negative for weakness, abnormal sensation, seizure, frequent headaches, memory loss, confusion.  Psych: Negative for anxiety, depression, suicidal ideation, hallucinations.  Endo: Negative for  unusual weight change.  Heme: Negative for bruising or bleeding. Allergy: Negative for rash or hives.    Physical Exam: There were no vitals taken for this visit. General:   Alert and oriented. Pleasant and cooperative. Well-nourished and well-developed.  Head:  Normocephalic and atraumatic. Eyes:  Without icterus, sclera clear and conjunctiva pink.  Ears:  Normal auditory acuity. Mouth:  No deformity or lesions, oral mucosa pink.  Throat/Neck:  Supple, without mass or thyromegaly. Cardiovascular:  S1, S2 present without murmurs appreciated. Normal pulses noted. Extremities without clubbing or edema. Respiratory:  Clear to auscultation bilaterally. No wheezes, rales, or rhonchi. No distress.  Gastrointestinal:  +BS, soft, non-tender and non-distended. No HSM noted. No guarding or rebound. No masses appreciated.  Rectal:  Deferred  Musculoskalatal:  Symmetrical without gross deformities. Normal posture. Skin:  Intact without significant lesions or rashes. Neurologic:  Alert and oriented x4;  grossly normal neurologically. Psych:  Alert and cooperative. Normal mood and affect. Heme/Lymph/Immune: No significant cervical adenopathy. No excessive bruising noted.    05/23/2018 7:38 AM   Disclaimer: This note was dictated with voice recognition software. Similar sounding words can inadvertently be transcribed and may not be corrected upon review.

## 2018-05-23 NOTE — Telephone Encounter (Signed)
PATIENT WAS A NO SHOW AND LETTER SENT  °

## 2018-06-11 ENCOUNTER — Ambulatory Visit: Payer: Medicaid Other | Admitting: Orthopedic Surgery

## 2018-06-11 ENCOUNTER — Encounter: Payer: Self-pay | Admitting: Orthopedic Surgery

## 2018-06-11 VITALS — BP 143/66 | HR 54 | Ht 68.5 in | Wt 233.0 lb

## 2018-06-11 DIAGNOSIS — M160 Bilateral primary osteoarthritis of hip: Secondary | ICD-10-CM

## 2018-06-11 NOTE — Progress Notes (Signed)
NEW PATIENT OFFICE VISIT  Chief Complaint  Patient presents with  . 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  He has deformity of the right lower extremity  No other history was obtainable with the schizophrenia  He was discharged from the hospital in November 2019Wallace N Skoog is a 53 y.o. male with medical history significant of schizophrenia, DM2, HTN, CKD stage 3.  Patient presents to the ED with c/o generalized weakness that has been progressively worsening over past few days.  He reports 2 weeks of diarrhea.  8 episodes per day, also vomiting at least once or twice a day as well.    Review of Systems  Respiratory: Positive for shortness of breath.   Cardiovascular: Negative for chest pain and palpitations.  Psychiatric/Behavioral: Positive for hallucinations.     Past Medical History:  Diagnosis Date  . Aortic insufficiency    mild to moderate   . Arteriosclerotic cardiovascular disease (ASCVD)    EF 60% -- Non-Q MI in 07/2008->  BMS to mid LAD  . Axillary abscess 2011   right  . Cerebrovascular disease    CVA  . Chronic obstructive pulmonary disease ([place])   . Diabetes mellitus type II   . Diabetes mellitus, type 2 (HCC)    A1c of 5.6 in 05/2011  . Hyperlipidemia   . Hypertension   . Mild anemia    Result.  Nl H&H in 02/2011  . Obesity   . Schizophrenia ([place])    Schizophrenia/bipolar disease/mental retardation  . Sinus bradycardia    On low dose beta blocker  . Tobacco abuse    15 pack years; 0.5 pack per day    Past Surgical History:  Procedure Laterality Date  . HERNIA REPAIR  1967    Family History  Problem Relation Age of Onset  . Hypertension Other   . Arthritis Other    Social History   Tobacco  Use  . Smoking status: Current Some Day Smoker    Packs/day: 0.50    Years: 30.00    Pack years: 15.00    Types: Cigarettes    Start date: 12/03/1974  . Smokeless tobacco: Never Used  . Tobacco comment: Patient is not motivated to quit.  He attributes his smoking to the lack of other activities.  Substance Use Topics  . Alcohol use: No    Alcohol/week: 0.0 standard drinks  . Drug use: No    No Known Allergies  Current Meds  Medication Sig  . acetaminophen (TYLENOL) 500 MG tablet Take 1 tablet (500 mg total) by mouth every 6 (six) hours as needed.  Marland Kitchen amLODipine (NORVASC) 10 MG tablet Take 10 mg by mouth every morning.   Marland Kitchen aspirin EC 81 MG tablet Take 81 mg by mouth every morning.   Marland Kitchen CATAPRES-TTS-3 0.3 MG/24HR patch PLACE 1 PATCH ONTO SKIN ONCE A WEEK. REMOVE OLD PATCH.  . clobetasol cream (TEMOVATE) 3.71 % Apply 1 application topically every morning. Applied to heels of feet  . diclofenac sodium (VOLTAREN) 1 % GEL Apply 2 g topically 4 (four) times daily.  . fish oil-omega-3 fatty acids 1000 MG capsule Take 1 g by mouth 4 (four) times daily.    Marland Kitchen  haloperidol decanoate (HALDOL DECANOATE) 100 MG/ML injection Inject 100 mg into the muscle every 14 (fourteen) days.    . hydrALAZINE (APRESOLINE) 100 MG tablet TAKE (1) TABLET BY MOUTH (3) TIMES DAILY.  Marland Kitchen insulin glargine (LANTUS) 100 UNIT/ML injection Inject 20 Units into the skin 2 (two) times daily. For blood sugar levels over 100. Do not administer for blood sugar levels lower than 100  . ketoconazole (NIZORAL) 2 % cream Apply 1 application topically every morning. Applied to feet  . loperamide (QC ANTI-DIARRHEAL) 2 MG tablet Take 2 mg by mouth 4 (four) times daily as needed for diarrhea or loose stools.  Marland Kitchen losartan (COZAAR) 50 MG tablet Take 50 mg by mouth daily.  . metoprolol tartrate (LOPRESSOR) 25 MG tablet Take by mouth.  . pravastatin (PRAVACHOL) 40 MG tablet Take 40 mg by mouth at bedtime.   . traZODone (DESYREL) 50 MG tablet  Take 50 mg by mouth at bedtime.    BP (!) 143/66   Pulse (!) 54   Ht 5' 8.5" (1.74 m)   Wt 233 lb (105.7 kg)   BMI 34.91 kg/m   Physical Exam Constitutional:      General: He is awake.     Appearance: Normal appearance.  Neurological:     Mental Status: He is alert.  Psychiatric:        Behavior: Behavior is cooperative.     Ortho Exam  Right hip lies in external rotation no tenderness he has rotation back to neutral flexion 100 degrees hip is stable strength is normal muscle tone excellent skin intact pulses good temperature normal no edema normal sensation of the right leg  On the left side he has flexion of 100 degrees no pain has mild pain with internal rotation his leg lengths are actually equal hip is stable strength is normal skin is intact pulses are good normal sensation  MEDICAL DECISION SECTION  Xrays were done at [facility] on February 13 bilateral hip arthritis left worse than right 2 views of the right hip also show arthritis moderate to severe on the left  My independent reading of xrays:  As above reports were reviewed Encounter Diagnosis  Name Primary?  . Bilateral hip joint arthritis Yes    PLAN: (Rx., injectx, surgery, frx, mri/ct) Probably not a good candidate for hip replacement stay will think about it however and let us know if he wants to be referred to [facility] for definitive care  No orders of the defined types were placed in this encounter.   Arther Abbott, MD  06/11/2018 2:55 PM

## 2018-06-11 NOTE — Progress Notes (Signed)
M

## 2018-07-16 ENCOUNTER — Ambulatory Visit: Payer: Medicaid Other | Admitting: Nurse Practitioner

## 2018-08-23 ENCOUNTER — Other Ambulatory Visit: Payer: Self-pay | Admitting: Cardiovascular Disease

## 2018-09-20 ENCOUNTER — Other Ambulatory Visit: Payer: Self-pay | Admitting: Cardiovascular Disease

## 2018-10-02 ENCOUNTER — Other Ambulatory Visit: Payer: Self-pay

## 2018-10-02 ENCOUNTER — Encounter: Payer: Self-pay | Admitting: Nurse Practitioner

## 2018-10-02 ENCOUNTER — Ambulatory Visit (INDEPENDENT_AMBULATORY_CARE_PROVIDER_SITE_OTHER): Payer: Medicaid Other | Admitting: Nurse Practitioner

## 2018-10-02 VITALS — BP 155/75 | HR 62 | Temp 97.8°F | Ht 68.0 in | Wt 232.6 lb

## 2018-10-02 DIAGNOSIS — R197 Diarrhea, unspecified: Secondary | ICD-10-CM

## 2018-10-02 DIAGNOSIS — R1111 Vomiting without nausea: Secondary | ICD-10-CM | POA: Diagnosis not present

## 2018-10-02 DIAGNOSIS — R1110 Vomiting, unspecified: Secondary | ICD-10-CM | POA: Insufficient documentation

## 2018-10-02 NOTE — Patient Instructions (Signed)
Your health issues we discussed today were:   Vomiting and diarrhea: 1. I am glad you are feeling better 2. If you start to have any more loose stools you can start taking a probiotic 3. Call us if you have recurrent vomiting or loose stools  Overall I recommend:  1. Continue your other current medications 2. Follow-up as needed for any GI symptoms 3. Call us if you have any questions or concerns.   Because of recent events of COVID-19 ("Coronavirus"), follow CDC recommendations:  1. Wash your hand frequently 2. Avoid touching your face 3. Stay away from people who are sick 4. If you have symptoms such as fever, cough, shortness of breath then call your healthcare provider for further guidance 5. If you are sick, STAY AT HOME unless otherwise directed by your healthcare provider. 6. Follow directions from state and national officials regarding staying safe   At [facility] we value your feedback. You may receive a survey about your visit today. Please share your experience as we strive to create trusting relationships with our patients to provide genuine, compassionate, quality care.  We appreciate your understanding and patience as we review any laboratory studies, imaging, and other diagnostic tests that are ordered as we care for you. Our office policy is 5 business days for review of these results, and any emergent or urgent results are addressed in a timely manner for your best interest. If you do not hear from our office in 1 week, please contact us.   We also encourage the use of MyChart, which contains your medical information for your review as well. If you are not enrolled in this feature, an access code is on this after visit summary for your convenience. Thank you for allowing [place] to be involved in your care.  It was great to see you today!  I hope you have a great summer!!

## 2018-10-02 NOTE — Assessment & Plan Note (Signed)
Previously seen in the ER/admitted to the hospital for diarrhea of presumed infectious etiology.  C. difficile and GI pathogen panel were negative.  Presumed viral etiology and managed conservatively in the hospital and discharged in satisfactory condition.  He was referred by primary care to follow-up with [place].  He missed his initial consult visit.  As of today he notes no further diarrhea.  Recommend he continue to monitor and notify us of any worsening or recurrence.  Follow-up as needed.

## 2018-10-02 NOTE — Progress Notes (Signed)
cc'ed to pcp °

## 2018-10-02 NOTE — Assessment & Plan Note (Signed)
The patient was referred by primary care for vomiting and diarrhea.  He was seen in the hospital earlier this year.  He was a no-show to his initial consult appointment with [place].  He is not having any symptoms currently.  Recommend he continue to monitor for symptoms and call us if any recurrence.  Follow-up as needed.

## 2018-10-02 NOTE — Progress Notes (Signed)
Primary Care Physician:  Sinda Du, MD Primary Gastroenterologist:  Dr. Gala Romney  Chief Complaint  Patient presents with  . Emesis    better since referral  . Diarrhea    HPI:   [name] is a 53 y.o. male who presents on referral from primary care for vomiting and diarrhea.  Patient was/referred to our office 05/23/2018 but was a no-show to his appointment.  Reviewed information provided with referral including office visit dated 12/21/2017.  Complains of vomiting or diarrhea at that time.  Also noted ER visit and admission as per above..  No history of colonoscopy or endoscopy in our system.  The patient was previously admitted to the hospital from 02/06/2018 through 02/08/2017 for diarrhea of presumed infectious origin.  Noted history of hypertension, diabetes, CKD stage III, schizophrenia.  He was admitted for acute kidney injury on chronic kidney disease.  C. difficile and GI pathogen panels are negative.  CT with acute diarrheal illness.  Recommended discontinue further antibiotics and suspect viral illness.  Patient improved with supportive treatment and was discharged after tolerating a diet.  Today he states he's doing well overall. Denies abdominal pain, N/V, hematochezia, melena, fever, chills, unintentional weight loss. Denies URI or flu-like symptoms. Denies loss of sense of taste or smell. Denies chest pain, dyspnea, dizziness, lightheadedness, syncope, near syncope. Denies any other upper or lower GI symptoms.  Past Medical History:  Diagnosis Date  . Aortic insufficiency    mild to moderate   . Arteriosclerotic cardiovascular disease (ASCVD)    EF 60% -- Non-Q MI in 07/2008->  BMS to mid LAD  . Axillary abscess 2011   right  . Cerebrovascular disease    CVA  . Chronic obstructive pulmonary disease ([place])   . Diabetes mellitus type II   . Diabetes mellitus, type 2 (HCC)    A1c of 5.6 in 05/2011  . Hyperlipidemia   . Hypertension   . Mild anemia    Result.   Nl H&H in 02/2011  . Obesity   . Schizophrenia ([place])    Schizophrenia/bipolar disease/mental retardation  . Sinus bradycardia    On low dose beta blocker  . Tobacco abuse    15 pack years; 0.5 pack per day    Past Surgical History:  Procedure Laterality Date  . HERNIA REPAIR  1967    Current Outpatient Medications  Medication Sig Dispense Refill  . acetaminophen (TYLENOL) 500 MG tablet Take 1 tablet (500 mg total) by mouth every 6 (six) hours as needed. 30 tablet 0  . amLODipine (NORVASC) 10 MG tablet Take 10 mg by mouth every morning.     Marland Kitchen aspirin EC 81 MG tablet Take 81 mg by mouth every morning.     Marland Kitchen CATAPRES-TTS-3 0.3 MG/24HR patch PLACE 1 PATCH ONTO SKIN ONCE A WEEK. REMOVE OLD PATCH. 4 patch 6  . clobetasol cream (TEMOVATE) 8.18 % Apply 1 application topically every morning. Applied to heels of feet    . diclofenac sodium (VOLTAREN) 1 % GEL Apply 2 g topically 4 (four) times daily.    . fish oil-omega-3 fatty acids 1000 MG capsule Take 1 g by mouth 4 (four) times daily.      . haloperidol decanoate (HALDOL DECANOATE) 100 MG/ML injection Inject 100 mg into the muscle every 14 (fourteen) days.      . hydrALAZINE (APRESOLINE) 100 MG tablet TAKE (1) TABLET BY MOUTH (3) TIMES DAILY. 90 tablet 6  . insulin glargine (LANTUS) 100 UNIT/ML injection  Inject 20 Units into the skin 2 (two) times daily. For blood sugar levels over 100. Do not administer for blood sugar levels lower than 100    . ketoconazole (NIZORAL) 2 % cream Apply 1 application topically every morning. Applied to feet    . loperamide (QC ANTI-DIARRHEAL) 2 MG tablet Take 2 mg by mouth 4 (four) times daily as needed for diarrhea or loose stools.    Marland Kitchen losartan (COZAAR) 50 MG tablet Take 50 mg by mouth daily.    . metoprolol tartrate (LOPRESSOR) 25 MG tablet Take 25 mg by mouth 2 (two) times daily.     . Multiple Vitamin (DAILY VITE PO) Take 1 tablet by mouth every morning.     . nitroGLYCERIN (NITROSTAT) 0.4 MG SL tablet  Place 0.4 mg under the tongue every 5 (five) minutes as needed for chest pain.    . potassium chloride (K-DUR) 10 MEQ tablet TAKE (1) TABLET BY MOUTH ONCE DAILY. 30 tablet 6  . pravastatin (PRAVACHOL) 40 MG tablet Take 40 mg by mouth at bedtime.     . traZODone (DESYREL) 50 MG tablet Take 50 mg by mouth at bedtime.     No current facility-administered medications for this visit.     Allergies as of 10/02/2018  . (No Known Allergies)    Family History  Problem Relation Age of Onset  . Hypertension Other   . Arthritis Other     Social History   Socioeconomic History  . Marital status: Single    Spouse name: Not on file  . Number of children: Not on file  . Years of education: Not on file  . Highest education level: Not on file  Occupational History  . Occupation: Disabled  Social Needs  . Financial resource strain: Not on file  . Food insecurity    Worry: Not on file    Inability: Not on file  . Transportation needs    Medical: Not on file    Non-medical: Not on file  Tobacco Use  . Smoking status: Current Every Day Smoker    Packs/day: 1.00    Years: 30.00    Pack years: 30.00    Types: Cigarettes    Start date: 12/03/1974  . Smokeless tobacco: Never Used  . Tobacco comment: Patient is not motivated to quit.  He attributes his smoking to the lack of other activities.  Substance and Sexual Activity  . Alcohol use: No    Alcohol/week: 0.0 standard drinks  . Drug use: No  . Sexual activity: Never  Lifestyle  . Physical activity    Days per week: Not on file    Minutes per session: Not on file  . Stress: Not on file  Relationships  . Social Herbalist on phone: Not on file    Gets together: Not on file    Attends religious service: Not on file    Active member of club or organization: Not on file    Attends meetings of clubs or organizations: Not on file    Relationship status: Not on file  . Intimate partner violence    Fear of current or ex  partner: Not on file    Emotionally abused: Not on file    Physically abused: Not on file    Forced sexual activity: Not on file  Other Topics Concern  . Not on file  Social History Narrative   Resides in an assisted-living facility    Review of Systems: General:  Negative for anorexia, weight loss, fever, chills, fatigue, weakness. ENT: Negative for hoarseness, difficulty swallowing. CV: Negative for chest pain, angina, palpitations, peripheral edema.  Respiratory: Negative for dyspnea at rest, cough, sputum, wheezing.  GI: See history of present illness. MS: Negative for joint pain, low back pain.  Derm: Negative for rash or itching.  Endo: Negative for unusual weight change.  Heme: Negative for bruising or bleeding. Allergy: Negative for rash or hives.    Physical Exam: BP (!) 155/75   Pulse 62   Temp 97.8 F (36.6 C) (Oral)   Ht $R'5\' 8"'bR$  (1.727 m)   Wt 232 lb 9.6 oz (105.5 kg)   BMI 35.37 kg/m  General:   Alert and oriented. Pleasant and cooperative. Well-nourished and well-developed. In a good mood, laughing frequently Head:  Normocephalic and atraumatic. Eyes:  Without icterus, sclera clear and conjunctiva pink.  Ears:  Normal auditory acuity. Cardiovascular:  S1, S2 present without murmurs appreciated. Normal pulses noted. Extremities without clubbing or edema. Respiratory:  Clear to auscultation bilaterally. No wheezes, rales, or rhonchi. No distress.  Gastrointestinal:  +BS, soft, non-tender and non-distended. No HSM noted. No guarding or rebound. No masses appreciated.  Rectal:  Deferred  Musculoskalatal:  Symmetrical without gross deformities. Normal posture. Skin:  Intact without significant lesions or rashes. Neurologic:  Alert and oriented x4;  grossly normal neurologically. Psych:  Alert and cooperative. Normal mood and affect. Heme/Lymph/Immune: No excessive bruising noted.    10/02/2018 10:55 AM   Disclaimer: This note was dictated with voice  recognition software. Similar sounding words can inadvertently be transcribed and may not be corrected upon review.

## 2018-11-05 ENCOUNTER — Other Ambulatory Visit: Payer: Self-pay | Admitting: Cardiovascular Disease

## 2018-12-08 ENCOUNTER — Other Ambulatory Visit: Payer: Self-pay | Admitting: Cardiovascular Disease

## 2019-01-05 ENCOUNTER — Other Ambulatory Visit: Payer: Self-pay | Admitting: Cardiovascular Disease

## 2019-02-06 ENCOUNTER — Other Ambulatory Visit: Payer: Self-pay | Admitting: Cardiovascular Disease

## 2019-03-07 ENCOUNTER — Other Ambulatory Visit: Payer: Self-pay | Admitting: Cardiovascular Disease

## 2019-03-08 ENCOUNTER — Other Ambulatory Visit: Payer: Self-pay | Admitting: Cardiovascular Disease

## 2019-03-14 ENCOUNTER — Telehealth: Payer: Self-pay | Admitting: Cardiovascular Disease

## 2019-03-14 MED ORDER — CLONIDINE HCL 0.1 MG PO TABS: 0.1000 mg | ORAL_TABLET | Freq: Two times a day (BID) | ORAL | 11 refills | Status: DC

## 2019-03-14 NOTE — Telephone Encounter (Signed)
Per phone call from Ed at [facility]-- Medicaid no longer covers the cloNIDine (CATAPRES - DOSED IN MG/24 HR) 0.3 mg/24hr patch [[id number]]  In patch form and they'd like Dr. Bronson Ing to write a new Rx for the tablet form.

## 2019-03-14 NOTE — Telephone Encounter (Signed)
Orders placed.

## 2019-03-14 NOTE — Telephone Encounter (Signed)
Will forward to Dr. Bronson Ing

## 2019-03-14 NOTE — Telephone Encounter (Signed)
Start with clonidine 0.1 mg twice daily.

## 2019-04-09 ENCOUNTER — Other Ambulatory Visit: Payer: Self-pay | Admitting: Cardiovascular Disease

## 2019-05-09 ENCOUNTER — Telehealth: Payer: Self-pay

## 2019-05-09 ENCOUNTER — Other Ambulatory Visit: Payer: Self-pay | Admitting: Cardiovascular Disease

## 2019-05-09 NOTE — Telephone Encounter (Signed)

## 2019-05-15 ENCOUNTER — Encounter: Payer: Self-pay | Admitting: Cardiovascular Disease

## 2019-05-15 ENCOUNTER — Telehealth (INDEPENDENT_AMBULATORY_CARE_PROVIDER_SITE_OTHER): Payer: Medicaid Other | Admitting: Cardiovascular Disease

## 2019-05-15 VITALS — BP 167/81 | HR 57 | Temp 98.1°F | Wt 241.0 lb

## 2019-05-15 DIAGNOSIS — I119 Hypertensive heart disease without heart failure: Secondary | ICD-10-CM

## 2019-05-15 DIAGNOSIS — I351 Nonrheumatic aortic (valve) insufficiency: Secondary | ICD-10-CM

## 2019-05-15 DIAGNOSIS — N183 Chronic kidney disease, stage 3 unspecified: Secondary | ICD-10-CM | POA: Diagnosis not present

## 2019-05-15 DIAGNOSIS — I25118 Atherosclerotic heart disease of native coronary artery with other forms of angina pectoris: Secondary | ICD-10-CM | POA: Diagnosis not present

## 2019-05-15 DIAGNOSIS — I350 Nonrheumatic aortic (valve) stenosis: Secondary | ICD-10-CM

## 2019-05-15 DIAGNOSIS — I352 Nonrheumatic aortic (valve) stenosis with insufficiency: Secondary | ICD-10-CM

## 2019-05-15 DIAGNOSIS — I10 Essential (primary) hypertension: Secondary | ICD-10-CM

## 2019-05-15 DIAGNOSIS — I1310 Hypertensive heart and chronic kidney disease without heart failure, with stage 1 through stage 4 chronic kidney disease, or unspecified chronic kidney disease: Secondary | ICD-10-CM | POA: Diagnosis not present

## 2019-05-15 DIAGNOSIS — E785 Hyperlipidemia, unspecified: Secondary | ICD-10-CM

## 2019-05-15 MED ORDER — CARVEDILOL 3.125 MG PO TABS: 3.1250 mg | ORAL_TABLET | Freq: Two times a day (BID) | ORAL | 3 refills | Status: DC

## 2019-05-15 NOTE — Addendum Note (Signed)
Addended byDebbora Lacrosse R on: 05/15/2019 12:01 PM   Modules accepted: Orders

## 2019-05-15 NOTE — Patient Instructions (Signed)
Medication Instructions:  STOP LOPRESSOR   START COREG 3.125 MG- TWO TIMES DAILY   Labwork: NONE  Testing/Procedures: Your physician has requested that you have an echocardiogram. Echocardiography is a painless test that uses sound waves to create images of your heart. It provides your doctor with information about the size and shape of your heart and how well your heart's chambers and valves are working. This procedure takes approximately one hour. There are no restrictions for this procedure.    Follow-Up: Your physician wants you to follow-up in: 1 YEAR.  You will receive a reminder letter in the mail two months in advance. If you don't receive a letter, please call our office to schedule the follow-up appointment.   Any Other Special Instructions Will Be Listed Below (If Applicable).  PLEASE GET A PRIMARY CARE PHYSICIAN.           If you need a refill on your cardiac medications before your next appointment, please call your pharmacy.

## 2019-05-15 NOTE — Progress Notes (Signed)
Virtual Visit via Telephone Note   This visit type was conducted due to national recommendations for restrictions regarding the COVID-19 Pandemic (e.g. social distancing) in an effort to limit this patient's exposure and mitigate transmission in our community.  Due to his co-morbid illnesses, this patient is at least at moderate risk for complications without adequate follow up.  This format is felt to be most appropriate for this patient at this time.  The patient did not have access to video technology/had technical difficulties with video requiring transitioning to audio format only (telephone).  All issues noted in this document were discussed and addressed.  No physical exam could be performed with this format.  Please refer to the patient's chart for his  consent to telehealth for [facility].   Date:  05/15/2019   ID:  [name], DOB Sep 24, 1965, MRN [id number]  Patient Location: Home Provider Location: Home  PCP:  Sinda Du, MD  Cardiologist:  Kate Sable, MD  Electrophysiologist:  None   Evaluation Performed:  Follow-Up Visit  Chief Complaint:  CAD  History of Present Illness:    [name] is a 54 y.o. male with a history of coronary artery disease having sustained a non-STEMI with a bare-metal stent to the LAD in April 2010. He also has essential hypertension, hyperlipidemia, insulin-dependent diabetes mellitus, and a history of tobacco abuse.  The patient denies any symptoms of chest pain, palpitations, shortness of breath, lightheadedness, dizziness, leg swelling, orthopnea, PND, and syncope.  He used to see Dr. Luan Pulling but since his retirement he has not followed with a PCP.  Past Medical History:  Diagnosis Date  . Aortic insufficiency    mild to moderate   . Arteriosclerotic cardiovascular disease (ASCVD)    EF 60% -- Non-Q MI in 07/2008->  BMS to mid LAD  . Axillary abscess 2011   right  . Cerebrovascular disease    CVA  . Chronic  obstructive pulmonary disease ([place])   . Diabetes mellitus type II   . Diabetes mellitus, type 2 (HCC)    A1c of 5.6 in 05/2011  . Hyperlipidemia   . Hypertension   . Mild anemia    Result.  Nl H&H in 02/2011  . Obesity   . Schizophrenia ([place])    Schizophrenia/bipolar disease/mental retardation  . Sinus bradycardia    On low dose beta blocker  . Tobacco abuse    15 pack years; 0.5 pack per day   Past Surgical History:  Procedure Laterality Date  . HERNIA REPAIR  1967     Current Meds  Medication Sig  . acetaminophen (TYLENOL) 500 MG tablet Take 1 tablet (500 mg total) by mouth every 6 (six) hours as needed.  Marland Kitchen amLODipine (NORVASC) 10 MG tablet Take 10 mg by mouth every morning.   Marland Kitchen aspirin EC 81 MG tablet Take 81 mg by mouth every morning.   . clobetasol cream (TEMOVATE) 5.91 % Apply 1 application topically every morning. Applied to heels of feet  . cloNIDine (CATAPRES) 0.1 MG tablet Take 1 tablet (0.1 mg total) by mouth 2 (two) times daily.  . diclofenac sodium (VOLTAREN) 1 % GEL Apply 2 g topically 4 (four) times daily.  . fish oil-omega-3 fatty acids 1000 MG capsule Take 1 g by mouth 4 (four) times daily.    . haloperidol decanoate (HALDOL DECANOATE) 100 MG/ML injection Inject 100 mg into the muscle every 14 (fourteen) days.    . hydrALAZINE (APRESOLINE) 100 MG tablet TAKE (1)  TABLET BY MOUTH (3) TIMES DAILY.  Marland Kitchen insulin glargine (LANTUS) 100 UNIT/ML injection Inject 20 Units into the skin 2 (two) times daily. For blood sugar levels over 100. Do not administer for blood sugar levels lower than 100  . ketoconazole (NIZORAL) 2 % cream Apply 1 application topically every morning. Applied to feet  . loperamide (QC ANTI-DIARRHEAL) 2 MG tablet Take 2 mg by mouth 4 (four) times daily as needed for diarrhea or loose stools.  Marland Kitchen losartan (COZAAR) 50 MG tablet Take 50 mg by mouth daily.  . metoprolol tartrate (LOPRESSOR) 25 MG tablet Take 25 mg by mouth 2 (two) times daily.   . Multiple  Vitamin (DAILY VITE PO) Take 1 tablet by mouth every morning.   . nitroGLYCERIN (NITROSTAT) 0.4 MG SL tablet Place 0.4 mg under the tongue every 5 (five) minutes as needed for chest pain.  . potassium chloride (KLOR-CON) 10 MEQ tablet TAKE (1) TABLET BY MOUTH ONCE DAILY.  . pravastatin (PRAVACHOL) 40 MG tablet Take 40 mg by mouth at bedtime.   . traZODone (DESYREL) 50 MG tablet Take 50 mg by mouth at bedtime.     Allergies:   Patient has no known allergies.   Social History   Tobacco Use  . Smoking status: Current Every Day Smoker    Packs/day: 1.00    Years: 30.00    Pack years: 30.00    Types: Cigarettes    Start date: 12/03/1974  . Smokeless tobacco: Never Used  . Tobacco comment: Patient is not motivated to quit.  He attributes his smoking to the lack of other activities.  Substance Use Topics  . Alcohol use: No    Alcohol/week: 0.0 standard drinks  . Drug use: No     Family Hx: The patient's family history includes Arthritis in an other family member; Hypertension in an other family member.  ROS:   Please see the history of present illness.     All other systems reviewed and are negative.   Prior CV studies:   The following studies were reviewed today:  Echocardiogram 02/20/2018:  - Left ventricle: The cavity size was normal. Wall thickness was  increased in a pattern of severe LVH. Systolic function was  normal. The estimated ejection fraction was in the range of 60%  to 65%. Wall motion was normal; there were no regional wall  motion abnormalities. Features are consistent with a pseudonormal  left ventricular filling pattern, with concomitant abnormal  relaxation and increased filling pressure (grade 2 diastolic  dysfunction). Doppler parameters are consistent with high  ventricular filling pressure.  - Aortic valve: Mildly calcified annulus. Trileaflet; mildly  thickened leaflets. There was mild stenosis. There was moderate  regurgitation.  Mean gradient (S): 12 mm Hg. Valve area (VTI): 1.8  cm^2. Valve area (Vmax): 1.76 cm^2. Valve area (Vmean): 1.97  cm^2.  - Mitral valve: Mildly calcified annulus. Mildly thickened leaflets  .  - Left atrium: The atrium was severely dilated.  - Right atrium: The atrium was mildly to moderately dilated.  - Atrial septum: No defect or patent foramen ovale was identified.   Labs/Other Tests and Data Reviewed:    EKG:  No ECG reviewed.  Recent Labs: No results found for requested labs within last 8760 hours.   Recent Lipid Panel Lab Results  Component Value Date/Time   CHOL 136 12/26/2017 09:12 AM   TRIG 136 12/26/2017 09:12 AM   HDL 31 (L) 12/26/2017 09:12 AM   CHOLHDL 4.4 12/26/2017 09:12 AM  [place] 78 12/26/2017 09:12 AM    Wt Readings from Last 3 Encounters:  05/15/19 241 lb (109.3 kg)  10/02/18 232 lb 9.6 oz (105.5 kg)  06/11/18 233 lb (105.7 kg)     Objective:    Vital Signs:  BP (!) 167/81   Pulse (!) 57   Temp 98.1 F (36.7 C)   Wt 241 lb (109.3 kg)   BMI 36.64 kg/m    VITAL SIGNS:  reviewed  ASSESSMENT & PLAN:    1. CYE:LYHTMB ischemic heart disease.  Currently on aspirin, pravastatin, and metoprolol. I will switch metoprolol to carvedilol 3.125 mg twice daily for more optimal blood pressure control.  2. Essential HTN with hypertensive heart disease/severe PJP:ETKKOECX.  I will switch metoprolol to carvedilol 3.125 mg twice daily for more optimal blood pressure control.  3. Hyperlipidemia:Continue pravastatin 40 mg.  4.  Chronic kidney disease stage III: Needs to be followed by PCP.  5. Aortic regurgitation: Moderate in severity by echocardiogram November 2019.  I will obtain a follow-up echocardiogram.  6.  Aortic stenosis: Mild in severity by echocardiogram November 2019.  I will obtain a follow-up echocardiogram.    COVID-19 Education: The signs and symptoms of COVID-19 were discussed with the patient and how to seek care for testing  (follow up with PCP or arrange E-visit).  The importance of social distancing was discussed today.  Time:   Today, I have spent 25 minutes with the patient with telehealth technology discussing the above problems.     Medication Adjustments/Labs and Tests Ordered: Current medicines are reviewed at length with the patient today.  Concerns regarding medicines are outlined above.   Tests Ordered: No orders of the defined types were placed in this encounter.   Medication Changes: No orders of the defined types were placed in this encounter.   Follow Up:  In Person in 1 year(s)  Signed, Kate Sable, MD  05/15/2019 11:39 AM    [place]

## 2019-05-21 ENCOUNTER — Other Ambulatory Visit: Payer: Self-pay

## 2019-05-21 ENCOUNTER — Ambulatory Visit (HOSPITAL_COMMUNITY)
Admission: RE | Admit: 2019-05-21 | Discharge: 2019-05-21 | Disposition: A | Discharge status: Home / Self Care | Payer: Medicaid Other | Source: Ambulatory Visit | Attending: Cardiovascular Disease | Admitting: Cardiovascular Disease

## 2019-05-21 DIAGNOSIS — I351 Nonrheumatic aortic (valve) insufficiency: Secondary | ICD-10-CM | POA: Insufficient documentation

## 2019-05-21 NOTE — Progress Notes (Signed)
*  PRELIMINARY RESULTS* Echocardiogram 2D Echocardiogram has been performed.  [name] 05/21/2019, 3:32 PM

## 2019-07-25 ENCOUNTER — Observation Stay (HOSPITAL_COMMUNITY)
Admission: EM | Admit: 2019-07-25 | Discharge: 2019-07-26 | Disposition: A | Discharge status: Home / Self Care | Payer: Medicaid Other | Attending: Family Medicine | Admitting: Family Medicine

## 2019-07-25 ENCOUNTER — Encounter (HOSPITAL_COMMUNITY): Payer: Self-pay

## 2019-07-25 ENCOUNTER — Emergency Department (HOSPITAL_COMMUNITY): Payer: Medicaid Other

## 2019-07-25 ENCOUNTER — Other Ambulatory Visit: Payer: Self-pay

## 2019-07-25 DIAGNOSIS — N179 Acute kidney failure, unspecified: Secondary | ICD-10-CM

## 2019-07-25 DIAGNOSIS — R1013 Epigastric pain: Secondary | ICD-10-CM | POA: Diagnosis not present

## 2019-07-25 DIAGNOSIS — Z794 Long term (current) use of insulin: Secondary | ICD-10-CM | POA: Insufficient documentation

## 2019-07-25 DIAGNOSIS — F329 Major depressive disorder, single episode, unspecified: Secondary | ICD-10-CM | POA: Insufficient documentation

## 2019-07-25 DIAGNOSIS — R778 Other specified abnormalities of plasma proteins: Secondary | ICD-10-CM

## 2019-07-25 DIAGNOSIS — F250 Schizoaffective disorder, bipolar type: Secondary | ICD-10-CM | POA: Insufficient documentation

## 2019-07-25 DIAGNOSIS — N189 Chronic kidney disease, unspecified: Secondary | ICD-10-CM | POA: Diagnosis not present

## 2019-07-25 DIAGNOSIS — Z20822 Contact with and (suspected) exposure to covid-19: Secondary | ICD-10-CM | POA: Diagnosis not present

## 2019-07-25 DIAGNOSIS — Z79899 Other long term (current) drug therapy: Secondary | ICD-10-CM | POA: Diagnosis not present

## 2019-07-25 DIAGNOSIS — I2510 Atherosclerotic heart disease of native coronary artery without angina pectoris: Secondary | ICD-10-CM | POA: Diagnosis not present

## 2019-07-25 DIAGNOSIS — I10 Essential (primary) hypertension: Secondary | ICD-10-CM | POA: Diagnosis not present

## 2019-07-25 DIAGNOSIS — K219 Gastro-esophageal reflux disease without esophagitis: Secondary | ICD-10-CM | POA: Diagnosis present

## 2019-07-25 DIAGNOSIS — F209 Schizophrenia, unspecified: Secondary | ICD-10-CM | POA: Diagnosis present

## 2019-07-25 DIAGNOSIS — I129 Hypertensive chronic kidney disease with stage 1 through stage 4 chronic kidney disease, or unspecified chronic kidney disease: Secondary | ICD-10-CM | POA: Diagnosis not present

## 2019-07-25 DIAGNOSIS — E1122 Type 2 diabetes mellitus with diabetic chronic kidney disease: Secondary | ICD-10-CM | POA: Insufficient documentation

## 2019-07-25 DIAGNOSIS — E119 Type 2 diabetes mellitus without complications: Secondary | ICD-10-CM

## 2019-07-25 DIAGNOSIS — E785 Hyperlipidemia, unspecified: Secondary | ICD-10-CM | POA: Diagnosis not present

## 2019-07-25 DIAGNOSIS — R7989 Other specified abnormal findings of blood chemistry: Secondary | ICD-10-CM | POA: Insufficient documentation

## 2019-07-25 LAB — CBC WITH DIFFERENTIAL/PLATELET
Abs Immature Granulocytes: 0.03 10*3/uL (ref 0.00–0.07)
Basophils Absolute: 0 10*3/uL (ref 0.0–0.1)
Basophils Relative: 0 %
Eosinophils Absolute: 0.3 10*3/uL (ref 0.0–0.5)
Eosinophils Relative: 4 %
HCT: 33.9 % — ABNORMAL LOW (ref 39.0–52.0)
Hemoglobin: 10.5 g/dL — ABNORMAL LOW (ref 13.0–17.0)
Immature Granulocytes: 0 %
Lymphocytes Relative: 13 %
Lymphs Abs: 0.9 10*3/uL (ref 0.7–4.0)
MCH: 26.8 pg (ref 26.0–34.0)
MCHC: 31 g/dL (ref 30.0–36.0)
MCV: 86.5 fL (ref 80.0–100.0)
Monocytes Absolute: 0.4 10*3/uL (ref 0.1–1.0)
Monocytes Relative: 6 %
Neutro Abs: 5.2 10*3/uL (ref 1.7–7.7)
Neutrophils Relative %: 77 %
Platelets: 182 10*3/uL (ref 150–400)
RBC: 3.92 MIL/uL — ABNORMAL LOW (ref 4.22–5.81)
RDW: 15 % (ref 11.5–15.5)
WBC: 6.9 10*3/uL (ref 4.0–10.5)
nRBC: 0 % (ref 0.0–0.2)

## 2019-07-25 LAB — COMPREHENSIVE METABOLIC PANEL
ALT: 19 U/L (ref 0–44)
AST: 23 U/L (ref 15–41)
Albumin: 3.1 g/dL — ABNORMAL LOW (ref 3.5–5.0)
Alkaline Phosphatase: 105 U/L (ref 38–126)
Anion gap: 9 (ref 5–15)
BUN: 40 mg/dL — ABNORMAL HIGH (ref 6–20)
CO2: 23 mmol/L (ref 22–32)
Calcium: 8.4 mg/dL — ABNORMAL LOW (ref 8.9–10.3)
Chloride: 107 mmol/L (ref 98–111)
Creatinine, Ser: 4.24 mg/dL — ABNORMAL HIGH (ref 0.61–1.24)
GFR calc Af Amer: 17 mL/min — ABNORMAL LOW (ref >60)
GFR calc non Af Amer: 15 mL/min — ABNORMAL LOW (ref >60)
Glucose, Bld: 102 mg/dL — ABNORMAL HIGH (ref 70–99)
Potassium: 4.2 mmol/L (ref 3.5–5.1)
Sodium: 139 mmol/L (ref 135–145)
Total Bilirubin: 0.6 mg/dL (ref 0.3–1.2)
Total Protein: 6.7 g/dL (ref 6.5–8.1)

## 2019-07-25 LAB — URINALYSIS, ROUTINE W REFLEX MICROSCOPIC
Bacteria, UA: NONE SEEN
Bilirubin Urine: NEGATIVE
Glucose, UA: NEGATIVE mg/dL
Hgb urine dipstick: NEGATIVE
Ketones, ur: NEGATIVE mg/dL
Leukocytes,Ua: NEGATIVE
Nitrite: NEGATIVE
Protein, ur: >=300 mg/dL — AB
Specific Gravity, Urine: 1.003 — ABNORMAL LOW (ref 1.005–1.030)
pH: 6 (ref 5.0–8.0)

## 2019-07-25 LAB — TROPONIN I (HIGH SENSITIVITY): Troponin I (High Sensitivity): 142 ng/L (ref <18)

## 2019-07-25 LAB — LIPASE, BLOOD: Lipase: 27 U/L (ref 11–51)

## 2019-07-25 MED ORDER — OMEPRAZOLE 20 MG PO CPDR: 20.0000 mg | DELAYED_RELEASE_CAPSULE | Freq: Every day | ORAL | 0 refills | Status: AC

## 2019-07-25 MED ORDER — FUROSEMIDE 40 MG PO TABS
20.0000 mg | ORAL_TABLET | Freq: Once | ORAL | Status: AC
  Administered 2019-07-25: 20 mg via ORAL
  Filled 2019-07-25: qty 1

## 2019-07-25 MED ORDER — CLONIDINE HCL 0.1 MG PO TABS
0.1000 mg | ORAL_TABLET | Freq: Two times a day (BID) | ORAL | Status: DC
  Administered 2019-07-25 – 2019-07-26 (×2): 0.1 mg via ORAL
  Filled 2019-07-25 (×2): qty 1

## 2019-07-25 MED ORDER — ALBUTEROL SULFATE HFA 108 (90 BASE) MCG/ACT IN AERS
2.0000 | INHALATION_SPRAY | Freq: Once | RESPIRATORY_TRACT | Status: AC
  Administered 2019-07-25: 2 via RESPIRATORY_TRACT
  Filled 2019-07-25: qty 6.7

## 2019-07-25 MED ORDER — SUCRALFATE 1 GM/10ML PO SUSP: 1.0000 g | Freq: Three times a day (TID) | ORAL | 0 refills | Status: DC

## 2019-07-25 NOTE — ED Provider Notes (Signed)
[facility] EMERGENCY DEPARTMENT Provider Note   CSN: [id number] Arrival date & time: 07/25/19  1800    History Chief Complaint  Patient presents with  . Abdominal Pain    [name] is a 54 y.o. male with past medical history significant for COPD, diabetes, hypertension, hyperlipidemia, schizophrenia who presents for evaluation of abdominal pain.  Patient states last night he had abdominal pain after he ate.  Patient states he also had an episode after he ate lunch today however did not have any pain after he ate dinner.  Pain located to epigastric region.  Describes as a burning sensation.  He has no pain currently.  Denies fever, chills, nausea, vomiting, chest pain, shortness of breath, diarrhea, constipation, back pain, weakness.  Admits to chronic non-productive cough related to his COPD. Denies additional aggravating or alleviating factors. Rates pain a 0/10.  History obtained from patient and past medical records.  No interpreter is used.  Attempted to reach Willaim Rayas, patient contact from Group home however rang to voice mail.  Patient does seem like a poor historian at baseline. Typically presents with caregiver at outpatient appointments.  HPI     Past Medical History:  Diagnosis Date  . Aortic insufficiency    mild to moderate   . Arteriosclerotic cardiovascular disease (ASCVD)    EF 60% -- Non-Q MI in 07/2008->  BMS to mid LAD  . Axillary abscess 2011   right  . Cerebrovascular disease    CVA  . Chronic obstructive pulmonary disease (HCC)   . Diabetes mellitus type II   . Diabetes mellitus, type 2 (HCC)    A1c of 5.6 in 05/2011  . Hyperlipidemia   . Hypertension   . Mild anemia    Result.  Nl H&H in 02/2011  . Obesity   . Schizophrenia (HCC)    Schizophrenia/bipolar disease/mental retardation  . Sinus bradycardia    On low dose beta blocker  . Tobacco abuse    15 pack years; 0.5 pack per day    Patient Active Problem List   Diagnosis Date  Noted  . Vomiting 10/02/2018  . Acute renal failure superimposed on stage 3 chronic kidney disease (HCC) 02/06/2018  . Schizoaffective disorder, bipolar type (HCC) 01/04/2016  . Acute renal failure (HCC) 12/17/2012  . Dehydration 12/17/2012  . Diarrhea 12/17/2012  . Gastroenteritis 12/17/2012  . GERD (gastroesophageal reflux disease) 12/17/2012  . Metabolic acidosis 12/17/2012  . Chronic kidney disease, stage 2, mildly decreased GFR 03/19/2012  . Arteriosclerotic cardiovascular disease (ASCVD)   . Schizophrenia (HCC)   . Tobacco abuse   . Diabetes mellitus, type 2 (HCC)   . Hypertension   . Hyperlipidemia   . Aortic insufficiency     Past Surgical History:  Procedure Laterality Date  . HERNIA REPAIR  1967       Family History  Problem Relation Age of Onset  . Hypertension Other   . Arthritis Other     Social History   Tobacco Use  . Smoking status: Current Every Day Smoker    Packs/day: 1.00    Years: 30.00    Pack years: 30.00    Types: Cigarettes    Start date: 12/03/1974  . Smokeless tobacco: Never Used  . Tobacco comment: Patient is not motivated to quit.  He attributes his smoking to the lack of other activities.  Substance Use Topics  . Alcohol use: No    Alcohol/week: 0.0 standard drinks  . Drug use: No  Home Medications Prior to Admission medications   Medication Sig Start Date End Date Taking? Authorizing Provider  amLODipine (NORVASC) 10 MG tablet Take 10 mg by mouth every morning.    Yes [provider]  aspirin EC 81 MG tablet Take 81 mg by mouth every morning.    Yes [provider]  carvedilol (COREG) 3.125 MG tablet Take 1 tablet (3.125 mg total) by mouth 2 (two) times daily. 05/15/19 08/13/19 Yes Herminio Commons, MD  cholecalciferol (VITAMIN D3) 25 MCG (1000 UNIT) tablet Take 1,000 Units by mouth daily.   Yes [provider]  cloNIDine (CATAPRES) 0.1 MG tablet Take 1 tablet (0.1 mg total) by mouth 2 (two) times  daily. 03/14/19  Yes Herminio Commons, MD  diclofenac sodium (VOLTAREN) 1 % GEL Apply 2 g topically 4 (four) times daily.   Yes [provider]  fish oil-omega-3 fatty acids 1000 MG capsule Take 1 g by mouth in the morning, at noon, and at bedtime.    Yes [provider]  furosemide (LASIX) 20 MG tablet Take 20 mg by mouth in the morning and at bedtime.   Yes [provider]  haloperidol decanoate (HALDOL DECANOATE) 100 MG/ML injection Inject 100 mg into the muscle every 14 (fourteen) days.     Yes [provider]  hydrALAZINE (APRESOLINE) 100 MG tablet TAKE (1) TABLET BY MOUTH (3) TIMES DAILY. Patient taking differently: Take 100 mg by mouth 3 (three) times daily.  05/09/19  Yes Herminio Commons, MD  insulin glargine (LANTUS) 100 UNIT/ML injection Inject 20 Units into the skin See admin instructions. Twice daily For blood sugar levels over 100. Do not administer for blood sugar levels lower than 100   Yes [provider]  loperamide (QC ANTI-DIARRHEAL) 2 MG tablet Take 2 mg by mouth 4 (four) times daily as needed for diarrhea or loose stools.   Yes [provider]  metoprolol tartrate (LOPRESSOR) 25 MG tablet Take 25 mg by mouth 2 (two) times daily.   Yes [provider]  minoxidil (LONITEN) 10 MG tablet Take 10 mg by mouth daily.   Yes [provider]  Multiple Vitamin (DAILY VITE PO) Take 1 tablet by mouth every morning.    Yes [provider]  NIFEdipine (ADALAT CC) 90 MG 24 hr tablet Take 90 mg by mouth daily.   Yes [provider]  nitroGLYCERIN (NITROSTAT) 0.4 MG SL tablet Place 0.4 mg under the tongue every 5 (five) minutes as needed for chest pain.   Yes [provider]  potassium chloride (KLOR-CON) 10 MEQ tablet TAKE (1) TABLET BY MOUTH ONCE DAILY. Patient taking differently: Take 10 mEq by mouth daily.  05/09/19  Yes Herminio Commons, MD  pravastatin (PRAVACHOL) 40 MG tablet Take 40  mg by mouth at bedtime.    Yes [provider]  sodium bicarbonate 650 MG tablet Take 650 mg by mouth 3 (three) times daily.   Yes [provider]  traZODone (DESYREL) 50 MG tablet Take 50 mg by mouth at bedtime.   Yes [provider]  acetaminophen (TYLENOL) 500 MG tablet Take 1 tablet (500 mg total) by mouth every 6 (six) hours as needed. 01/11/16   [name], PA-C  omeprazole (PRILOSEC) 20 MG capsule Take 1 capsule (20 mg total) by mouth daily. 07/25/19   Emory Leaver A, PA-C  sucralfate (CARAFATE) 1 GM/10ML suspension Take 10 mLs (1 g total) by mouth 4 (four) times daily -  with  meals and at bedtime. 07/25/19   Starlyn Droge A, PA-C    Allergies    Patient has no known allergies.  Review of Systems   Review of Systems  Constitutional: Negative.   HENT: Negative.   Respiratory: Positive for cough (Chronic). Negative for apnea, choking, chest tightness, shortness of breath, wheezing and stridor.   Cardiovascular: Negative.   Gastrointestinal: Positive for abdominal pain (None currently). Negative for abdominal distention, anal bleeding, blood in stool, constipation, diarrhea, nausea, rectal pain and vomiting.  Genitourinary: Negative.   Musculoskeletal: Negative.   Skin: Negative.  Negative for color change.  Neurological: Negative.   All other systems reviewed and are negative.   Physical Exam Updated Vital Signs BP (!) 175/112   Pulse 82   Temp 98.3 F (36.8 C) (Oral)   Resp (!) 22   Ht $R'5\' 8"'Ve$  (1.727 m)   Wt 108.9 kg   SpO2 93%   BMI 36.49 kg/m   Physical Exam Vitals and nursing note reviewed.  Constitutional:      General: He is not in acute distress.    Appearance: He is well-developed. He is not ill-appearing, toxic-appearing or diaphoretic.  HENT:     Head: Normocephalic and atraumatic.     Mouth/Throat:     Mouth: Mucous membranes are moist.  Eyes:     Pupils: Pupils are equal, round, and reactive to light.  Cardiovascular:      Rate and Rhythm: Normal rate and regular rhythm.     Heart sounds: Normal heart sounds.  Pulmonary:     Effort: Pulmonary effort is normal. No respiratory distress.     Comments: Mild diffuse expiratory wheeze however no acute distress.  Patient states breathing is at baseline Abdominal:     General: Bowel sounds are normal. There is no distension.     Palpations: Abdomen is soft.     Tenderness: There is no abdominal tenderness. There is no right CVA tenderness or guarding. Negative signs include Murphy's sign and McBurney's sign.     Hernia: No hernia is present.  Musculoskeletal:        General: Normal range of motion.     Cervical back: Normal range of motion and neck supple.     Comments: Compartments soft.  Moves all 4 extremities without difficulty.  No evidence of DVT bilaterally.  Skin:    General: Skin is warm and dry.     Capillary Refill: Capillary refill takes less than 2 seconds.  Neurological:     General: No focal deficit present.     Mental Status: He is alert.    ED Results / Procedures / Treatments   Labs (all labs ordered are listed, but only abnormal results are displayed) Labs Reviewed  CBC WITH DIFFERENTIAL/PLATELET - Abnormal; Notable for the following components:      Result Value   RBC 3.92 (*)    Hemoglobin 10.5 (*)    HCT 33.9 (*)    All other components within normal limits  COMPREHENSIVE METABOLIC PANEL - Abnormal; Notable for the following components:   Glucose, Bld 102 (*)    BUN 40 (*)    Creatinine, Ser 4.24 (*)    Calcium 8.4 (*)    Albumin 3.1 (*)    GFR calc non Af Amer 15 (*)    GFR calc Af Amer 17 (*)    All other components within normal limits  TROPONIN I (HIGH SENSITIVITY) - Abnormal; Notable for the following components:   Troponin I (  High Sensitivity) 142 (*)    All other components within normal limits  LIPASE, BLOOD  URINALYSIS, ROUTINE W REFLEX MICROSCOPIC    EKG EKG Interpretation  Date/Time:  Thursday July 25 2019  20:33:53 EDT Ventricular Rate:  81 PR Interval:    QRS Duration: 89 QT Interval:  395 QTC Calculation: 459 R Axis:   21 Text Interpretation: Sinus rhythm LVH with secondary repolarization abnormality Artifact Confirmed by Fredia Sorrow [phone]) on 07/25/2019 8:51:26 PM   Radiology No results found.  Procedures Procedures (including critical care time)  Medications Ordered in ED Medications  cloNIDine (CATAPRES) tablet 0.1 mg (0.1 mg Oral Given 07/25/19 2132)  albuterol (VENTOLIN HFA) 108 (90 Base) MCG/ACT inhaler 2 puff (2 puffs Inhalation Given 07/25/19 2130)  furosemide (LASIX) tablet 20 mg (20 mg Oral Given 07/25/19 2132)    ED Course  I have reviewed the triage vital signs and the nursing notes.  Pertinent labs & imaging results that were available during my care of the patient were reviewed by me and considered in my medical decision making (see chart for details).  54 year old male appears otherwise well presents for evaluation of abdominal pain.  Had 2 episodes of abdominal pain, one yesterday after dinner and then 1 after lunch today which he describes as a burning to his epigastric region.  He denies any chest pain, shortness of breath.  He has no pain currently.  He is actually tolerating p.o. intake, initial evaluation.  Does have some mild expiratory wheeze however patient states his breathing is at his baseline.  Has chronic nonproductive cough due to his COPD.  No evidence of DVT on exam.  He appears overall well.  He is afebrile does not appear septic or ill.  He has no tachycardia, tachypnea or hypoxia.  Plan on labs and reassess  Labs personally reviewed and interpreted CBC without leukocytosis, hemoglobin 10.5, Hbg prior 11.9 06/20/19, likly 2/2 CKD.  Patient denies any melanotic or bright red blood stools. Metabolic panel with mild hyperglycemia to 102, creatinine 4.24, patient's baseline at 4.4,  Hx Nephrotic syndrome followed by [facility] with Nephrology Lipase  27 EKG with artifact however with some T wave inversions. No STEMI, Will add Troponin however patient denies CP, SOB.  Patient reassessed.  Denies any current pain. Patient does not meet the SIRS or Sepsis criteria.  On repeat exam patient does not have a surgical abdomin and there are no peritoneal signs.  No indication of appendicitis, bowel obstruction, bowel perforation, cholecystitis, diverticulitis, mesenteric ischemia, atypical ACS PE, AAA or dissection.    He is hypertensive here in the emergency department.  Has history of uncontrolled hypertension currently takes 5 medicines for this.  Denies any lightheadedness or dizziness.  We will give him his home nighttime medicines.  Low suspicion for hypertensive urgency or emergency.    Question if patient's pain is related to reflux (know hx of) versus gallbladder pathology.  He has negative Percell Miller sign currently, low suspicion for cholecystitis, cholangitis.  Unfortunately do not have ultrasound here currently.  I have placed outpatient orders for follow up to schedule this. In the mean time will start on PPI and Carafate as patient is not current on any of this medication.  Care transferred to [place], PA-C who will follow up on remaining labs and determine disposition. If trop negative likely dc home with outpatient [place] RUQ  ADDEND: Troponin 142, patient denies CP, SOB. Plan on repeat trop, plan to consult Cardiology after delta trop. No prior trop  in Epic to compare. Hx of CAD followed by Dr. Bronson Ing. Patient denies Orthopnea, PND, syncope, leg swelling.    MDM Rules/Calculators/A&P                       Final Clinical Impression(s) / ED Diagnoses Final diagnoses:  Epigastric pain  Elevated troponin    Rx / DC Orders ED Discharge Orders         Ordered    omeprazole (PRILOSEC) 20 MG capsule  Daily     07/25/19 2142    sucralfate (CARAFATE) 1 GM/10ML suspension  3 times daily with meals & bedtime     07/25/19 2142    US Abdomen  Limited RUQ/Gall Gladder     07/25/19 2143           Dirck Butch A, PA-C 07/25/19 2159    Fredia Sorrow, MD 07/29/19 [phone]

## 2019-07-25 NOTE — ED Triage Notes (Signed)
Per EMS- Pt c/o mid abdominal pain at night and immediately after eating.  Pt denies pain at this time.   Pt from [facility]. Call [facility] after pt D/C at [phone] to transport pt back to group home.

## 2019-07-25 NOTE — ED Notes (Signed)
Date and time results received: 07/25/19 2150  Test: Troponin Critical Value: 142  Name of Provider Notified: Shelby Dubin, PA  Orders Received? Or Actions Taken?: acknowledged

## 2019-07-26 ENCOUNTER — Observation Stay (HOSPITAL_BASED_OUTPATIENT_CLINIC_OR_DEPARTMENT_OTHER): Payer: Medicaid Other

## 2019-07-26 DIAGNOSIS — R778 Other specified abnormalities of plasma proteins: Secondary | ICD-10-CM

## 2019-07-26 DIAGNOSIS — E785 Hyperlipidemia, unspecified: Secondary | ICD-10-CM

## 2019-07-26 DIAGNOSIS — I10 Essential (primary) hypertension: Secondary | ICD-10-CM

## 2019-07-26 DIAGNOSIS — I25118 Atherosclerotic heart disease of native coronary artery with other forms of angina pectoris: Secondary | ICD-10-CM | POA: Diagnosis not present

## 2019-07-26 DIAGNOSIS — I351 Nonrheumatic aortic (valve) insufficiency: Secondary | ICD-10-CM

## 2019-07-26 DIAGNOSIS — I350 Nonrheumatic aortic (valve) stenosis: Secondary | ICD-10-CM | POA: Diagnosis not present

## 2019-07-26 DIAGNOSIS — N179 Acute kidney failure, unspecified: Secondary | ICD-10-CM | POA: Diagnosis not present

## 2019-07-26 DIAGNOSIS — N184 Chronic kidney disease, stage 4 (severe): Secondary | ICD-10-CM

## 2019-07-26 LAB — CBC
HCT: 35.1 % — ABNORMAL LOW (ref 39.0–52.0)
Hemoglobin: 10.8 g/dL — ABNORMAL LOW (ref 13.0–17.0)
MCH: 26.3 pg (ref 26.0–34.0)
MCHC: 30.8 g/dL (ref 30.0–36.0)
MCV: 85.6 fL (ref 80.0–100.0)
Platelets: 190 10*3/uL (ref 150–400)
RBC: 4.1 MIL/uL — ABNORMAL LOW (ref 4.22–5.81)
RDW: 15 % (ref 11.5–15.5)
WBC: 6.2 10*3/uL (ref 4.0–10.5)
nRBC: 0 % (ref 0.0–0.2)

## 2019-07-26 LAB — BASIC METABOLIC PANEL
Anion gap: 10 (ref 5–15)
BUN: 39 mg/dL — ABNORMAL HIGH (ref 6–20)
CO2: 24 mmol/L (ref 22–32)
Calcium: 9 mg/dL (ref 8.9–10.3)
Chloride: 107 mmol/L (ref 98–111)
Creatinine, Ser: 4.2 mg/dL — ABNORMAL HIGH (ref 0.61–1.24)
GFR calc Af Amer: 17 mL/min — ABNORMAL LOW (ref >60)
GFR calc non Af Amer: 15 mL/min — ABNORMAL LOW (ref >60)
Glucose, Bld: 106 mg/dL — ABNORMAL HIGH (ref 70–99)
Potassium: 4.1 mmol/L (ref 3.5–5.1)
Sodium: 141 mmol/L (ref 135–145)

## 2019-07-26 LAB — TROPONIN I (HIGH SENSITIVITY)
Troponin I (High Sensitivity): 149 ng/L (ref <18)
Troponin I (High Sensitivity): 159 ng/L (ref <18)
Troponin I (High Sensitivity): 146 ng/L (ref <18)

## 2019-07-26 LAB — RESPIRATORY PANEL BY RT PCR (FLU A&B, COVID)
Influenza A by PCR: NEGATIVE
Influenza B by PCR: NEGATIVE
SARS Coronavirus 2 by RT PCR: NEGATIVE

## 2019-07-26 LAB — HEMOGLOBIN A1C
Hgb A1c MFr Bld: 5.8 % — ABNORMAL HIGH (ref 4.8–5.6)
Mean Plasma Glucose: 119.76 mg/dL

## 2019-07-26 LAB — ECHOCARDIOGRAM COMPLETE
Height: 68.5 in
Weight: 3869.51 oz

## 2019-07-26 LAB — HIV ANTIBODY (ROUTINE TESTING W REFLEX): HIV Screen 4th Generation wRfx: NONREACTIVE

## 2019-07-26 MED ORDER — AMLODIPINE BESYLATE 5 MG PO TABS: 10.0000 mg | ORAL_TABLET | Freq: Every morning | ORAL | Status: DC

## 2019-07-26 MED ORDER — CARVEDILOL 3.125 MG PO TABS
3.1250 mg | ORAL_TABLET | Freq: Two times a day (BID) | ORAL | Status: DC
  Administered 2019-07-26: 3.125 mg via ORAL
  Filled 2019-07-26: qty 1

## 2019-07-26 MED ORDER — AMLODIPINE BESYLATE 10 MG PO TABS: 10.0000 mg | ORAL_TABLET | Freq: Every day | ORAL | 5 refills | Status: DC

## 2019-07-26 MED ORDER — CARVEDILOL 6.25 MG PO TABS: 6.2500 mg | ORAL_TABLET | Freq: Two times a day (BID) | ORAL | 5 refills | Status: DC

## 2019-07-26 MED ORDER — PRAVASTATIN SODIUM 40 MG PO TABS: 40.0000 mg | ORAL_TABLET | Freq: Every day | ORAL | Status: DC

## 2019-07-26 MED ORDER — SODIUM CHLORIDE 0.9% FLUSH: 3.0000 mL | INTRAVENOUS | Status: DC | PRN

## 2019-07-26 MED ORDER — NIFEDIPINE ER 90 MG PO TB24: 90.0000 mg | ORAL_TABLET | Freq: Every day | ORAL | 5 refills | Status: DC

## 2019-07-26 MED ORDER — ASPIRIN EC 81 MG PO TBEC: 81.0000 mg | DELAYED_RELEASE_TABLET | Freq: Every day | ORAL | 3 refills | Status: AC

## 2019-07-26 MED ORDER — CLONIDINE HCL 0.1 MG PO TABS: 0.1000 mg | ORAL_TABLET | Freq: Two times a day (BID) | ORAL | 11 refills | Status: DC

## 2019-07-26 MED ORDER — HYDRALAZINE HCL 25 MG PO TABS
50.0000 mg | ORAL_TABLET | Freq: Three times a day (TID) | ORAL | Status: DC
  Administered 2019-07-26: 10:00:00 50 mg via ORAL
  Filled 2019-07-26: qty 2

## 2019-07-26 MED ORDER — INSULIN GLARGINE 100 UNIT/ML ~~LOC~~ SOLN
20.0000 [IU] | Freq: Two times a day (BID) | SUBCUTANEOUS | Status: DC
  Administered 2019-07-26: 10:00:00 20 [IU] via SUBCUTANEOUS
  Filled 2019-07-26 (×7): qty 0.2

## 2019-07-26 MED ORDER — ADULT MULTIVITAMIN W/MINERALS CH
1.0000 | ORAL_TABLET | Freq: Every morning | ORAL | Status: DC
  Administered 2019-07-26: 10:00:00 1 via ORAL
  Filled 2019-07-26: qty 1

## 2019-07-26 MED ORDER — HYDRALAZINE HCL 100 MG PO TABS: 100.0000 mg | ORAL_TABLET | Freq: Three times a day (TID) | ORAL | 5 refills | Status: AC

## 2019-07-26 MED ORDER — CARVEDILOL 3.125 MG PO TABS: 6.2500 mg | ORAL_TABLET | Freq: Two times a day (BID) | ORAL | Status: DC

## 2019-07-26 MED ORDER — FUROSEMIDE 40 MG PO TABS: 40.0000 mg | ORAL_TABLET | Freq: Every day | ORAL | 2 refills | Status: DC

## 2019-07-26 MED ORDER — ASPIRIN EC 81 MG PO TBEC
81.0000 mg | DELAYED_RELEASE_TABLET | Freq: Every morning | ORAL | Status: DC
  Administered 2019-07-26: 81 mg via ORAL
  Filled 2019-07-26: qty 1

## 2019-07-26 MED ORDER — OMEGA-3-ACID ETHYL ESTERS 1 G PO CAPS
1.0000 g | ORAL_CAPSULE | Freq: Every day | ORAL | Status: DC
  Administered 2019-07-26: 10:00:00 1 g via ORAL
  Filled 2019-07-26: qty 1

## 2019-07-26 MED ORDER — ISOSORBIDE MONONITRATE ER 30 MG PO TB24
30.0000 mg | ORAL_TABLET | Freq: Every day | ORAL | 11 refills | Status: AC
Start: 2019-07-26 — End: 2020-07-25

## 2019-07-26 MED ORDER — SODIUM CHLORIDE 0.9% FLUSH: 3.0000 mL | Freq: Two times a day (BID) | INTRAVENOUS | Status: DC

## 2019-07-26 MED ORDER — BISACODYL 10 MG RE SUPP: 10.0000 mg | Freq: Every day | RECTAL | Status: DC | PRN

## 2019-07-26 MED ORDER — MINOXIDIL 10 MG PO TABS: 10.0000 mg | ORAL_TABLET | Freq: Every day | ORAL | 5 refills | Status: DC

## 2019-07-26 MED ORDER — SODIUM BICARBONATE 650 MG PO TABS
650.0000 mg | ORAL_TABLET | Freq: Three times a day (TID) | ORAL | Status: DC
  Administered 2019-07-26: 10:00:00 650 mg via ORAL
  Filled 2019-07-26: qty 1

## 2019-07-26 MED ORDER — PRAVASTATIN SODIUM 40 MG PO TABS: 40.0000 mg | ORAL_TABLET | Freq: Every day | ORAL | 5 refills | Status: AC

## 2019-07-26 MED ORDER — TRAZODONE HCL 50 MG PO TABS: 50.0000 mg | ORAL_TABLET | Freq: Every day | ORAL | Status: DC

## 2019-07-26 MED ORDER — VITAMIN D 25 MCG (1000 UNIT) PO TABS
1000.0000 [IU] | ORAL_TABLET | Freq: Every day | ORAL | Status: DC
  Administered 2019-07-26: 1000 [IU] via ORAL
  Filled 2019-07-26: qty 1

## 2019-07-26 MED ORDER — SODIUM CHLORIDE 0.9 % IV SOLN: 250.0000 mL | INTRAVENOUS | Status: DC | PRN

## 2019-07-26 MED ORDER — CARVEDILOL 3.125 MG PO TABS
3.1250 mg | ORAL_TABLET | Freq: Once | ORAL | Status: AC
  Administered 2019-07-26: 3.125 mg via ORAL
  Filled 2019-07-26: qty 1

## 2019-07-26 MED ORDER — ONDANSETRON HCL 4 MG/2ML IJ SOLN: 4.0000 mg | Freq: Four times a day (QID) | INTRAMUSCULAR | Status: DC | PRN

## 2019-07-26 MED ORDER — PANTOPRAZOLE SODIUM 40 MG IV SOLR
40.0000 mg | Freq: Every day | INTRAVENOUS | Status: DC
  Administered 2019-07-26: 40 mg via INTRAVENOUS
  Filled 2019-07-26 (×2): qty 40

## 2019-07-26 MED ORDER — NIFEDIPINE ER OSMOTIC RELEASE 30 MG PO TB24
90.0000 mg | ORAL_TABLET | Freq: Every day | ORAL | Status: DC
  Administered 2019-07-26: 90 mg via ORAL
  Filled 2019-07-26: qty 3

## 2019-07-26 MED ORDER — POTASSIUM CHLORIDE ER 10 MEQ PO TBCR: EXTENDED_RELEASE_TABLET | ORAL | 0 refills | Status: DC

## 2019-07-26 MED ORDER — NITROGLYCERIN 0.4 MG SL SUBL: 0.4000 mg | SUBLINGUAL_TABLET | SUBLINGUAL | Status: DC | PRN

## 2019-07-26 MED ORDER — ALUM & MAG HYDROXIDE-SIMETH 200-200-20 MG/5ML PO SUSP: 30.0000 mL | ORAL | Status: DC | PRN

## 2019-07-26 MED ORDER — POLYETHYLENE GLYCOL 3350 17 G PO PACK: 17.0000 g | PACK | Freq: Every day | ORAL | Status: DC | PRN

## 2019-07-26 MED ORDER — POLYETHYLENE GLYCOL 3350 17 G PO PACK
17.0000 g | PACK | Freq: Every day | ORAL | Status: DC
  Administered 2019-07-26: 05:00:00 17 g via ORAL
  Filled 2019-07-26 (×2): qty 1

## 2019-07-26 MED ORDER — HEPARIN SODIUM (PORCINE) 5000 UNIT/ML IJ SOLN
5000.0000 [IU] | Freq: Three times a day (TID) | INTRAMUSCULAR | Status: DC
  Administered 2019-07-26: 05:00:00 5000 [IU] via SUBCUTANEOUS
  Filled 2019-07-26: qty 1

## 2019-07-26 MED ORDER — POTASSIUM CHLORIDE CRYS ER 10 MEQ PO TBCR
10.0000 meq | EXTENDED_RELEASE_TABLET | Freq: Every day | ORAL | Status: DC
  Administered 2019-07-26: 10 meq via ORAL
  Filled 2019-07-26: qty 1

## 2019-07-26 MED ORDER — ONDANSETRON HCL 4 MG PO TABS: 4.0000 mg | ORAL_TABLET | Freq: Four times a day (QID) | ORAL | Status: DC | PRN

## 2019-07-26 NOTE — Progress Notes (Addendum)
Pt unsure , but don't think he has legal guardian

## 2019-07-26 NOTE — Discharge Summary (Signed)
[name], is a 54 y.o. male  DOB 1965-09-28  MRN [id number].  Admission date:  07/25/2019  Admitting Physician  Lynetta Mare, MD  Discharge Date:  07/26/2019   Primary MD  Sinda Du, MD  Recommendations for primary care physician for things to follow:   1)Please note that there has been changes to your medications--- please take your medications as prescribed today not as he did before this admission 2)Avoid ibuprofen/Advil/Aleve/Motrin/Goody Powders/Naproxen/BC powders/Meloxicam/Diclofenac/Indomethacin and other Nonsteroidal anti-inflammatory medications as these will make you more likely to bleed and can cause stomach ulcers, can also cause Kidney problems.  3)Please follow-up with your nephrologist Dr. Theador Hawthorne in about 1 to 2 weeks for further evaluation and recheck 4)Very low-salt diet advised 5)Weigh yourself daily, call if you gain more than 3 pounds in 1 day or more than 5 pounds in 1 week as your diuretic medications may need to be adjusted  Admission Diagnosis  Epigastric pain [R10.13] Elevated troponin [R77.8] Essential hypertension [I10]   Discharge Diagnosis  Epigastric pain [R10.13] Elevated troponin [R77.8] Essential hypertension [I10]    Principal Problem:   Elevated troponin Active Problems:   Hypertension   Hyperlipidemia   CAD (coronary artery disease)   Schizophrenia (HCC)   Diabetes mellitus, type 2 (HCC)   GERD (gastroesophageal reflux disease)   Acute on chronic renal failure (HCC)      Past Medical History:  Diagnosis Date  . Aortic insufficiency    mild to moderate   . Arteriosclerotic cardiovascular disease (ASCVD)    EF 60% -- Non-Q MI in 07/2008->  BMS to mid LAD  . Axillary abscess 2011   right  . Cerebrovascular disease    CVA  . Chronic obstructive pulmonary disease ([place])   . Diabetes mellitus type II   . Diabetes mellitus, type 2 (HCC)      A1c of 5.6 in 05/2011  . Hyperlipidemia   . Hypertension   . Mild anemia    Result.  Nl H&H in 02/2011  . Obesity   . Schizophrenia ([place])    Schizophrenia/bipolar disease/mental retardation  . Sinus bradycardia    On low dose beta blocker  . Tobacco abuse    15 pack years; 0.5 pack per day    Past Surgical History:  Procedure Laterality Date  . HERNIA REPAIR  1967       HPI  from the history and physical done on the day of admission:      HPI: [name] is a 54 y.o. male with medical history significant of schizophrenia, hypertension, hyperlipidemia, depression, diabetes mellitus type 2 who presented to the ER with some burning epigastric pain as well as chest pain.  Patient states his symptoms started in the epigastric region yesterday after food and then he started having retrosternal chest pain.  At the time of my evaluation his pain has resolved.  Pain was described as moderate intensity, no exacerbating or relieving factors, not worse with inspiration, nonradiating, not associated with shortness of breath. He  does report some intermittent cough but no significant expectoration. No fever, chills, shortness of breath, palpitations, dizziness, lightheadedness, headache, nausea, vomiting, diarrhea, abdominal pain, dysuria.  ED Course:  Vital Signs reviewed on presentation, significant for temperature 98.3, heart rate 78, blood pressure 180/58, saturation 92% on room air. Labs reviewed, significant for sodium 139, potassium 4.2, BUN 40, creatinine 4.2, LFTs within normal limits, troponin I 42 with repeat 149, WBC count 6.9, hemoglobin 10.5 hematocrit 33, platelets 182.  Urinalysis is negative. Imaging personally Reviewed, chest x-ray shows cardiomegaly, no acute cardiopulmonary disease. EKG personally reviewed, shows sinus rhythm, T wave inversions in 1, aVL, V4 to V6.  This T wave inversions do not appear new and are similar to prior EKG in 2019.    Hospital Course:        Principal Problem: Chest pain, admitted for workup of possible ACS Patient presented with chest and epigastric abdominal pain.  Found to have elevated troponins.  Delta troponin not significantly changed.  EKG shows chronic T wave inversions in lateral leads.  -History of CAD/NSTEMI with bare-metal stent placement to LAD in April 2010--   -Echocardiogram EF of 60 to 65% no regional wall motion normalities patient does have severe LVH -Cardiology consult appreciated, Dr. Bronson Ing recommends discharge home without further ischemia work-up at this time. -Continue aspirin, statin, beta-blockers -Smoking cessation advised, patient is not ready to quit smoking   Other Active Problems: Acute on chronic renal failure with baseline CKD stage IV Creatinine is 4.24 on presentation.  -No recent creatinine for comparison -Usually follows with Dr. Theador Hawthorne nephrologist as outpatient  Hypertension:  -Continue carvedilol, hydralazine, clonidine  Diabetes mellitus type 2: -Resume home regimen  Schizophrenia: -Patient is on haloperidol decanoate every 2 weeks  Depression: -Continue trazodone  Discharge Condition: stable  Follow UP  Follow-up Information    [facility].   Specialty: Emergency Medicine Why: If symptoms worsen Contact information: [address] [id number] [place]udy Fe[place] [address] [phone]       Liana Gerold, MD. Schedule an appointment as soon as possible for a visit in 1 week(s).   Specialty: Nephrology Why: For recheck and reevaluation and repeat BMP Contact information: [address] [address] [phone]           Consults obtained -   Diet and Activity recommendation:  As advised  Discharge Instructions    Discharge Instructions    Call MD for:  difficulty breathing, headache or visual disturbances   Complete by: As directed    Call MD for:  persistant dizziness or  light-headedness   Complete by: As directed    Call MD for:  persistant nausea and vomiting   Complete by: As directed    Call MD for:  severe uncontrolled pain   Complete by: As directed    Call MD for:  temperature >100.4   Complete by: As directed    Diet - low sodium heart healthy   Complete by: As directed    Discharge instructions   Complete by: As directed    1)Please note that there has been changes to your medications--- please take your medications as prescribed today not as he did before this admission 2)Avoid ibuprofen/Advil/Aleve/Motrin/Goody Powders/Naproxen/BC powders/Meloxicam/Diclofenac/Indomethacin and other Nonsteroidal anti-inflammatory medications as these will make you more likely to bleed and can cause stomach ulcers, can also cause Kidney problems.  3)Please follow-up with your nephrologist Dr. Theador Hawthorne in about 1 to 2 weeks for further evaluation and recheck  4)Very low-salt diet advised 5)Weigh yourself daily, call if you gain more than 3 pounds in 1 day or more than 5 pounds in 1 week as your diuretic medications may need to be adjusted   Increase activity slowly   Complete by: As directed        Discharge Medications     Allergies as of 07/26/2019   No Known Allergies     Medication List    STOP taking these medications   metoprolol tartrate 25 MG tablet Commonly known as: LOPRESSOR     TAKE these medications   acetaminophen 500 MG tablet Commonly known as: TYLENOL Take 1 tablet (500 mg total) by mouth every 6 (six) hours as needed.   amLODipine 10 MG tablet Commonly known as: NORVASC Take 1 tablet (10 mg total) by mouth daily. What changed: when to take this   aspirin EC 81 MG tablet Take 1 tablet (81 mg total) by mouth daily with breakfast. What changed: when to take this   carvedilol 6.25 MG tablet Commonly known as: COREG Take 1 tablet (6.25 mg total) by mouth 2 (two) times daily. What changed:   medication strength  how much to  take   cholecalciferol 25 MCG (1000 UNIT) tablet Commonly known as: VITAMIN D3 Take 1,000 Units by mouth daily.   cloNIDine 0.1 MG tablet Commonly known as: CATAPRES Take 1 tablet (0.1 mg total) by mouth 2 (two) times daily.   DAILY VITE PO Take 1 tablet by mouth every morning.   diclofenac sodium 1 % Gel Commonly known as: VOLTAREN Apply 2 g topically 4 (four) times daily.   fish oil-omega-3 fatty acids 1000 MG capsule Take 1 g by mouth in the morning, at noon, and at bedtime.   furosemide 40 MG tablet Commonly known as: LASIX Take 1 tablet (40 mg total) by mouth daily. For Fluid What changed:   medication strength  how much to take  when to take this  additional instructions   haloperidol decanoate 100 MG/ML injection Commonly known as: HALDOL DECANOATE Inject 100 mg into the muscle every 14 (fourteen) days.   hydrALAZINE 100 MG tablet Commonly known as: APRESOLINE Take 1 tablet (100 mg total) by mouth 3 (three) times daily. What changed: See the new instructions.   insulin glargine 100 UNIT/ML injection Commonly known as: LANTUS Inject 20 Units into the skin See admin instructions. Twice daily For blood sugar levels over 100. Do not administer for blood sugar levels lower than 100   isosorbide mononitrate 30 MG 24 hr tablet Commonly known as: IMDUR Take 1 tablet (30 mg total) by mouth daily.   minoxidil 10 MG tablet Commonly known as: LONITEN Take 1 tablet (10 mg total) by mouth daily.   NIFEdipine 90 MG 24 hr tablet Commonly known as: ADALAT CC Take 1 tablet (90 mg total) by mouth daily.   nitroGLYCERIN 0.4 MG SL tablet Commonly known as: NITROSTAT Place 0.4 mg under the tongue every 5 (five) minutes as needed for chest pain.   omeprazole 20 MG capsule Commonly known as: PRILOSEC Take 1 capsule (20 mg total) by mouth daily.   potassium chloride 10 MEQ tablet Commonly known as: KLOR-CON TAKE (1) TABLET BY MOUTH ONCE DAILY. What changed: See the  new instructions.   pravastatin 40 MG tablet Commonly known as: PRAVACHOL Take 1 tablet (40 mg total) by mouth at bedtime.   QC Anti-Diarrheal 2 MG tablet Generic drug: loperamide Take 2 mg by mouth 4 (four) times daily  as needed for diarrhea or loose stools.   sodium bicarbonate 650 MG tablet Take 650 mg by mouth 3 (three) times daily.   sucralfate 1 GM/10ML suspension Commonly known as: Carafate Take 10 mLs (1 g total) by mouth 4 (four) times daily -  with meals and at bedtime.   traZODone 50 MG tablet Commonly known as: DESYREL Take 50 mg by mouth at bedtime.      Major procedures and Radiology Reports - PLEASE review detailed and final reports for all details, in brief -   DG Chest Portable 1 View  Result Date: 07/25/2019 CLINICAL DATA:  Elevated troponin EXAM: PORTABLE CHEST 1 VIEW COMPARISON:  12/26/2017 FINDINGS: Cardiomegaly.No overt edema or effusions. No acute bony abnormality. IMPRESSION: Cardiomegaly.  No active disease. Electronically Signed   By: Rolm Baptise M.D.   On: 07/25/2019 22:16   ECHOCARDIOGRAM COMPLETE  Result Date: 07/26/2019    ECHOCARDIOGRAM REPORT   Patient Name:   [name] Date of Exam: 07/26/2019 Medical Rec #:  [id number]          Height:       68.5 in Accession #:    [id number]         Weight:       241.8 lb Date of Birth:  03/24/1966          BSA:          2.228 m Patient Age:    20 years           BP:           176/58 mmHg Patient Gender: M                  HR:           84 bpm. Exam Location:  [facility] Procedure: 2D Echo Indications:    CAD Native Vessel 414.01 / I25.10  History:        Patient has prior history of Echocardiogram examinations, most                 recent 05/21/2019. CAD; Risk Factors:Hypertension, Diabetes,                 Dyslipidemia and Current Smoker. Schizophrenia, GERD, Elevated                 Troponin, Acute renal failure.  Sonographer:    Leavy Cella RDCS (AE) Referring Phys: [place]   1. Left ventricular ejection fraction, by estimation, is 60 to 65%. The left ventricle has normal function. The left ventricle has no regional wall motion abnormalities. There is severe left ventricular hypertrophy. Left ventricular diastolic parameters  are indeterminate.  2. Right ventricular systolic function is normal. The right ventricular size is normal.  3. The mitral valve is abnormal. No evidence of mitral valve regurgitation. No evidence of mitral stenosis.  4. The aortic valve has an indeterminant number of cusps. Aortic valve regurgitation is moderate. Mild to moderate aortic valve stenosis.  5. The inferior vena cava is normal in size with greater than 50% respiratory variability, suggesting right atrial pressure of 3 mmHg. FINDINGS  Left Ventricle: Left ventricular ejection fraction, by estimation, is 60 to 65%. The left ventricle has normal function. The left ventricle has no regional wall motion abnormalities. The left ventricular internal cavity size was normal in size. There is  severe left ventricular hypertrophy. Left ventricular diastolic parameters are indeterminate. Right Ventricle: The right ventricular size is  normal. No increase in right ventricular wall thickness. Right ventricular systolic function is normal. Left Atrium: Left atrial size was normal in size. Right Atrium: Right atrial size was normal in size. Pericardium: A small pericardial effusion is present. The pericardial effusion is circumferential. Mitral Valve: The mitral valve is abnormal. There is moderate thickening of the mitral valve leaflet(s). There is moderate calcification of the mitral valve leaflet(s). Moderate mitral annular calcification. No evidence of mitral valve regurgitation. No evidence of mitral valve stenosis. Tricuspid Valve: The tricuspid valve is normal in structure. Tricuspid valve regurgitation is not demonstrated. No evidence of tricuspid stenosis. Aortic Valve: The aortic valve has an indeterminant  number of cusps. . There is moderate thickening and moderate calcification of the aortic valve. Aortic valve regurgitation is moderate. Aortic regurgitation PHT measures 419 msec. Mild to moderate aortic stenosis is present. Moderate aortic valve annular calcification. There is moderate thickening of the aortic valve. There is moderate calcification of the aortic valve. Aortic valve mean gradient measures 16.7 mmHg. Aortic valve peak gradient measures 33.2 mmHg. Aortic valve area, by VTI measures 1.39 cm. Pulmonic Valve: The pulmonic valve was not well visualized. Pulmonic valve regurgitation is mild. No evidence of pulmonic stenosis. Aorta: The aortic root is normal in size and structure. Pulmonary Artery: Indeterminant PASP, inadequate TR jet. Venous: The inferior vena cava is normal in size with greater than 50% respiratory variability, suggesting right atrial pressure of 3 mmHg. IAS/Shunts: The interatrial septum was not well visualized.  LEFT VENTRICLE PLAX 2D LVIDd:         5.53 cm LVIDs:         2.89 cm LV PW:         1.69 cm LV IVS:        1.62 cm LVOT diam:     1.90 cm LV SV:         76 LV SV Index:   34 LVOT Area:     2.84 cm  RIGHT VENTRICLE RV S prime:     20.80 cm/s TAPSE (M-mode): 2.9 cm LEFT ATRIUM             Index LA diam:        3.80 cm 1.71 cm/m LA Vol (A2C):   73.0 ml 32.77 ml/m LA Vol (A4C):   37.1 ml 16.65 ml/m LA Biplane Vol: 54.8 ml 24.60 ml/m  AORTIC VALVE AV Area (Vmax):    1.29 cm AV Area (Vmean):   1.40 cm AV Area (VTI):     1.39 cm AV Vmax:           288.19 cm/s AV Vmean:          188.160 cm/s AV VTI:            0.546 m AV Peak Grad:      33.2 mmHg AV Mean Grad:      16.7 mmHg LVOT Vmax:         131.40 cm/s LVOT Vmean:        93.200 cm/s LVOT VTI:          0.267 m LVOT/AV VTI ratio: 0.49 AI PHT:            419 msec  AORTA Ao Root diam: 3.10 cm MITRAL VALVE MV Area (PHT): 2.90 cm     SHUNTS MV Decel Time: 262 msec     Systemic VTI:  0.27 m MV E velocity: 109.00 cm/s  Systemic  Diam: 1.90 cm MV A velocity: 92.40  cm/s MV E/A ratio:  1.18 Dina Rich MD Electronically signed by Dina Rich MD Signature Date/Time: 07/26/2019/12:43:05 PM    Final    Micro Results   Recent Results (from the past 240 hour(s))  Respiratory Panel by RT PCR (Flu A&B, Covid) - Nasopharyngeal Swab     Status: None   Collection Time: 07/26/19  1:36 AM   Specimen: Nasopharyngeal Swab  Result Value Ref Range Status   SARS Coronavirus 2 by RT PCR NEGATIVE NEGATIVE Final    Comment: (NOTE) SARS-CoV-2 target nucleic acids are NOT DETECTED. The SARS-CoV-2 RNA is generally detectable in upper respiratoy specimens during the acute phase of infection. The lowest concentration of SARS-CoV-2 viral copies this assay can detect is 131 copies/mL. A negative result does not preclude SARS-Cov-2 infection and should not be used as the sole basis for treatment or other patient management decisions. A negative result may occur with  improper specimen collection/handling, submission of specimen other than nasopharyngeal swab, presence of viral mutation(s) within the areas targeted by this assay, and inadequate number of viral copies (<131 copies/mL). A negative result must be combined with clinical observations, patient history, and epidemiological information. The expected result is Negative. Fact Sheet for Patients:  https://www.moore.com/ Fact Sheet for Healthcare Providers:  https://www.young.biz/ This test is not yet ap proved or cleared by the [place] FDA and  has been authorized for detection and/or diagnosis of SARS-CoV-2 by FDA under an Emergency Use Authorization (EUA). This EUA will remain  in effect (meaning this test can be used) for the duration of the COVID-19 declaration under Section 564(b)(1) of the Act, 21 U.S.C. section 360bbb-3(b)(1), unless the authorization is terminated or revoked sooner.    Influenza A by PCR NEGATIVE NEGATIVE  Final   Influenza B by PCR NEGATIVE NEGATIVE Final    Comment: (NOTE) The Xpert Xpress SARS-CoV-2/FLU/RSV assay is intended as an aid in  the diagnosis of influenza from Nasopharyngeal swab specimens and  should not be used as a sole basis for treatment. Nasal washings and  aspirates are unacceptable for Xpert Xpress SARS-CoV-2/FLU/RSV  testing. Fact Sheet for Patients: https://www.moore.com/ Fact Sheet for Healthcare Providers: https://www.young.biz/ This test is not yet approved or cleared by the [place] FDA and  has been authorized for detection and/or diagnosis of SARS-CoV-2 by  FDA under an Emergency Use Authorization (EUA). This EUA will remain  in effect (meaning this test can be used) for the duration of the  Covid-19 declaration under Section 564(b)(1) of the Act, 21  U.S.C. section 360bbb-3(b)(1), unless the authorization is  terminated or revoked. Performed at [facility], [address] [place], [place] [address]     Today   Subjective    [name] today has no new complaints          Patient has been seen and examined prior to discharge   Objective   Blood pressure (!) 176/58, pulse 84, temperature 98.8 F (37.1 C), temperature source Oral, resp. rate 17, height 5' 8.5" (1.74 m), weight 109.7 kg, SpO2 90 %.   Intake/Output Summary (Last 24 hours) at 07/26/2019 1305 Last data filed at 07/26/2019 0900 Gross per 24 hour  Intake 240 ml  Output 1200 ml  Net -960 ml    Exam Gen:- Awake Alert, no acute distress  HEENT:- [place].AT, No sclera icterus Neck-Supple Neck,No JVD,.  Lungs-  CTAB , good air movement bilaterally  CV- S1, S2 normal, regular Abd-  +ve B.Sounds, Abd Soft, No tenderness,  Extremity/Skin:- No  edema,   good pulses Psych-affect is appropriate, oriented x3 Neuro-no new focal deficits, no tremors    Data Review   CBC w Diff:  Lab Results  Component Value Date   WBC 6.2 07/26/2019   HGB  10.8 (L) 07/26/2019   HCT 35.1 (L) 07/26/2019   PLT 190 07/26/2019   LYMPHOPCT 13 07/25/2019   MONOPCT 6 07/25/2019   EOSPCT 4 07/25/2019   BASOPCT 0 07/25/2019    CMP:  Lab Results  Component Value Date   NA 141 07/26/2019   NA 142 12/20/2016   K 4.1 07/26/2019   CL 107 07/26/2019   CO2 24 07/26/2019   BUN 39 (H) 07/26/2019   BUN 14 12/20/2016   CREATININE 4.20 (H) 07/26/2019   CREATININE 1.93 (H) 02/15/2017   PROT 6.7 07/25/2019   ALBUMIN 3.1 (L) 07/25/2019   BILITOT 0.6 07/25/2019   ALKPHOS 105 07/25/2019   AST 23 07/25/2019   ALT 19 07/25/2019  .   Total Discharge time is about 33 minutes  Roxan Hockey M.D on 07/26/2019 at 1:05 PM  Go to [email or web address] -  for contact info  [facility] - Office  [phone]

## 2019-07-26 NOTE — Discharge Instructions (Signed)
1)Please note that there has been changes to your medications--- please take your medications as prescribed today not as he did before this admission 2)Avoid ibuprofen/Advil/Aleve/Motrin/Goody Powders/Naproxen/BC powders/Meloxicam/Diclofenac/Indomethacin and other Nonsteroidal anti-inflammatory medications as these will make you more likely to bleed and can cause stomach ulcers, can also cause Kidney problems.  3)Please follow-up with your nephrologist Dr. Theador Hawthorne in about 1 to 2 weeks for further evaluation and recheck 4)Very low-salt diet advised 5)Weigh yourself daily, call if you gain more than 3 pounds in 1 day or more than 5 pounds in 1 week as your diuretic medications may need to be adjusted

## 2019-07-26 NOTE — Progress Notes (Signed)
CRITICAL VALUE ALERT  Critical Value:  troponin  Date & Time Notied:  07/26/19 6:32 AM   Provider Notified: gahdia  Orders Received/Actions taken: none

## 2019-07-26 NOTE — Progress Notes (Signed)
2D echo complete

## 2019-07-26 NOTE — Plan of Care (Signed)

## 2019-07-26 NOTE — ED Provider Notes (Signed)
Pt signed out to me from [employer] PA-C:  54 yo male with h/o COPD, DM, HTN, hyperlipidemia and h/o MI in 2010 presenting with intermittent burning sensation epigastric region after meals since yesterday.  Currently sx free.  Labs reviewed including delta troponin change of 7 (from 142-149).  Discussed case with Dr. Emilio Aspen with cardiology. Given elevation in troponins and heart score would recommend overnight observation with cardiac evaluation in am.  Discussed with patient who is agreeable with this plan.   Discussed with Dr Scherrie November who accepts pt for admission.    Results for orders placed or performed during the hospital encounter of 07/25/19  CBC with Differential  Result Value Ref Range   WBC 6.9 4.0 - 10.5 K/uL   RBC 3.92 (L) 4.22 - 5.81 MIL/uL   Hemoglobin 10.5 (L) 13.0 - 17.0 g/dL   HCT 33.9 (L) 39.0 - 52.0 %   MCV 86.5 80.0 - 100.0 fL   MCH 26.8 26.0 - 34.0 pg   MCHC 31.0 30.0 - 36.0 g/dL   RDW 15.0 11.5 - 15.5 %   Platelets 182 150 - 400 K/uL   nRBC 0.0 0.0 - 0.2 %   Neutrophils Relative % 77 %   Neutro Abs 5.2 1.7 - 7.7 K/uL   Lymphocytes Relative 13 %   Lymphs Abs 0.9 0.7 - 4.0 K/uL   Monocytes Relative 6 %   Monocytes Absolute 0.4 0.1 - 1.0 K/uL   Eosinophils Relative 4 %   Eosinophils Absolute 0.3 0.0 - 0.5 K/uL   Basophils Relative 0 %   Basophils Absolute 0.0 0.0 - 0.1 K/uL   Immature Granulocytes 0 %   Abs Immature Granulocytes 0.03 0.00 - 0.07 K/uL  Comprehensive metabolic panel  Result Value Ref Range   Sodium 139 135 - 145 mmol/L   Potassium 4.2 3.5 - 5.1 mmol/L   Chloride 107 98 - 111 mmol/L   CO2 23 22 - 32 mmol/L   Glucose, Bld 102 (H) 70 - 99 mg/dL   BUN 40 (H) 6 - 20 mg/dL   Creatinine, Ser 4.24 (H) 0.61 - 1.24 mg/dL   Calcium 8.4 (L) 8.9 - 10.3 mg/dL   Total Protein 6.7 6.5 - 8.1 g/dL   Albumin 3.1 (L) 3.5 - 5.0 g/dL   AST 23 15 - 41 U/L   ALT 19 0 - 44 U/L   Alkaline Phosphatase 105 38 - 126 U/L   Total Bilirubin 0.6 0.3 - 1.2  mg/dL   GFR calc non Af Amer 15 (L) >60 mL/min   GFR calc Af Amer 17 (L) >60 mL/min   Anion gap 9 5 - 15  Lipase, blood  Result Value Ref Range   Lipase 27 11 - 51 U/L  Urinalysis, Routine w reflex microscopic  Result Value Ref Range   Color, Urine STRAW (A) YELLOW   APPearance CLEAR CLEAR   Specific Gravity, Urine 1.003 (L) 1.005 - 1.030   pH 6.0 5.0 - 8.0   Glucose, UA NEGATIVE NEGATIVE mg/dL   Hgb urine dipstick NEGATIVE NEGATIVE   Bilirubin Urine NEGATIVE NEGATIVE   Ketones, ur NEGATIVE NEGATIVE mg/dL   Protein, ur >=300 (A) NEGATIVE mg/dL   Nitrite NEGATIVE NEGATIVE   Leukocytes,Ua NEGATIVE NEGATIVE   RBC / HPF 0-5 0 - 5 RBC/hpf   WBC, UA 0-5 0 - 5 WBC/hpf   Bacteria, UA NONE SEEN NONE SEEN  Troponin I (High Sensitivity)  Result Value Ref Range   Troponin I (High Sensitivity) 142 (  HH) <18 ng/L  Troponin I (High Sensitivity)  Result Value Ref Range   Troponin I (High Sensitivity) 149 (HH) <18 ng/L   DG Chest Portable 1 View  Result Date: 07/25/2019 CLINICAL DATA:  Elevated troponin EXAM: PORTABLE CHEST 1 VIEW COMPARISON:  12/26/2017 FINDINGS: Cardiomegaly.No overt edema or effusions. No acute bony abnormality. IMPRESSION: Cardiomegaly.  No active disease. Electronically Signed   By: Rolm Baptise M.D.   On: 07/25/2019 22:16      Landis Martins 07/26/19 0207    Fredia Sorrow, MD 07/29/19 [phone]

## 2019-07-26 NOTE — Care Management (Signed)
Patient discharging. Discussed with [facility] of [facility]. She will pick up patient today. Patient here under observation less than 24 hours, no FL2 needed.

## 2019-07-26 NOTE — H&P (Signed)
History and Physical    Patient Demographics:    [name]:[id number] DOB: Aug 16, 1965 DOA: 07/25/2019  PCP: Sinda Du, MD  Patient coming from: Home   I have personally briefly reviewed patient's old medical records in [place]  Chief Complaint: Abdominal pain   Assessment & Plan:     Assessment/Plan Principal Problem:   Elevated troponin Active Problems:   Hypertension   Hyperlipidemia   CAD (coronary artery disease)   Schizophrenia ([place])   Diabetes mellitus, type 2 ([place])   GERD (gastroesophageal reflux disease)   Acute on chronic renal failure (HCC)     Principal Problem: Chest pain, admitted for workup of possible ACS Patient presented with chest and epigastric abdominal pain.  Found to have elevated troponins.  Delta troponin not significantly changed.  EKG shows chronic T wave inversions in lateral leads.  Does have a history of coronary artery disease with stent placement in 2010.  Since patient had elevated troponins, case was discussed with cardiology on-call at [facility] who recommended overnight observation with cardiology consult in the morning.  There was no indication for transfer to [facility] at this time as per cardiology. -Telemetry monitoring, serial troponins -Echocardiogram -Cardiology consult in a.m. -Nitro, morphine as needed -Continue aspirin, statin, beta-blockers   Other Active Problems: Acute on chronic renal failure with baseline CKD stage IV Creatinine is 4.24 on presentation.  Baseline appears to be around 2.8-3.4.  Follows with nephrology as outpatient. -Continue sodium bicarbonate -Patient is on Lasix at home which will be kept on hold for now due to worsening renal function. -Monitor input output, renal function -Follow-up with nephrology as outpatient  Coronary artery disease Patient is status post stent placement in 2010 -Continue aspirin  Hypertension:  -Continue carvedilol, hydralazine, clonidine   Hyperlipidemia: -Continue pravastatin  Diabetes mellitus type 2: -Sliding scale insulin coverage with fingerstick monitoring -Continue Lantus  Schizophrenia: -Patient is on haloperidol decanoate every 2 weeks  Depression: -Continue trazodone   DVT prophylaxis: Lovenox Code Status:  Full code Family Communication: N/A  Disposition Plan: Place in observation for evaluation of atypical abdominal pain Consults called: N/A Admission status: Observation stay    HPI:     HPI: [name] is a 54 y.o. male with medical history significant of schizophrenia, hypertension, hyperlipidemia, depression, diabetes mellitus type 2 who presented to the ER with some burning epigastric pain as well as chest pain.  Patient states his symptoms started in the epigastric region yesterday after food and then he started having retrosternal chest pain.  At the time of my evaluation his pain has resolved.  Pain was described as moderate intensity, no exacerbating or relieving factors, not worse with inspiration, nonradiating, not associated with shortness of breath. He does report some intermittent cough but no significant expectoration. No fever, chills, shortness of breath, palpitations, dizziness, lightheadedness, headache, nausea, vomiting, diarrhea, abdominal pain, dysuria.  ED Course:  Vital Signs reviewed on presentation, significant for temperature 98.3, heart rate 78, blood pressure 180/58, saturation 92% on room air. Labs reviewed, significant for sodium 139, potassium 4.2, BUN 40, creatinine 4.2, LFTs within normal limits, troponin I 42 with repeat 149, WBC count 6.9, hemoglobin 10.5 hematocrit 33, platelets 182.  Urinalysis is negative. Imaging personally Reviewed, chest x-ray shows cardiomegaly, no acute cardiopulmonary disease. EKG personally reviewed, shows sinus rhythm, T wave inversions in 1, aVL, V4 to V6.  This T wave inversions do not appear new and are similar to prior EKG in 2019.  Review of systems:    Review of Systems: As per HPI otherwise 10 point review of systems negative.  All other review of systems is negative except the ones noted above in the HPI.    Past Medical and Surgical History:  Reviewed by me  Past Medical History:  Diagnosis Date  . Aortic insufficiency    mild to moderate   . Arteriosclerotic cardiovascular disease (ASCVD)    EF 60% -- Non-Q MI in 07/2008->  BMS to mid LAD  . Axillary abscess 2011   right  . Cerebrovascular disease    CVA  . Chronic obstructive pulmonary disease ([place])   . Diabetes mellitus type II   . Diabetes mellitus, type 2 (HCC)    A1c of 5.6 in 05/2011  . Hyperlipidemia   . Hypertension   . Mild anemia    Result.  Nl H&H in 02/2011  . Obesity   . Schizophrenia ([place])    Schizophrenia/bipolar disease/mental retardation  . Sinus bradycardia    On low dose beta blocker  . Tobacco abuse    15 pack years; 0.5 pack per day    Past Surgical History:  Procedure Laterality Date  . HERNIA REPAIR  1967     Social History:  Reviewed by me   reports that he has been smoking cigarettes. He started smoking about 44 years ago. He has a 30.00 pack-year smoking history. He has never used smokeless tobacco. He reports that he does not drink alcohol or use drugs.  Allergies:    No Known Allergies  Family History :   Family History  Problem Relation Age of Onset  . Hypertension Other   . Arthritis Other    Family history reviewed, noted as above, not pertinent to current presentation.   Home Medications:    Prior to Admission medications   Medication Sig Start Date End Date Taking? Authorizing Provider  amLODipine (NORVASC) 10 MG tablet Take 10 mg by mouth every morning.    Yes [provider]  aspirin EC 81 MG tablet Take 81 mg by mouth every morning.    Yes [provider]  carvedilol (COREG) 3.125 MG tablet Take 1 tablet (3.125 mg total) by mouth 2 (two) times daily. 05/15/19 08/13/19 Yes  Herminio Commons, MD  cholecalciferol (VITAMIN D3) 25 MCG (1000 UNIT) tablet Take 1,000 Units by mouth daily.   Yes [provider]  cloNIDine (CATAPRES) 0.1 MG tablet Take 1 tablet (0.1 mg total) by mouth 2 (two) times daily. 03/14/19  Yes Herminio Commons, MD  diclofenac sodium (VOLTAREN) 1 % GEL Apply 2 g topically 4 (four) times daily.   Yes [provider]  fish oil-omega-3 fatty acids 1000 MG capsule Take 1 g by mouth in the morning, at noon, and at bedtime.    Yes [provider]  furosemide (LASIX) 20 MG tablet Take 20 mg by mouth in the morning and at bedtime.   Yes [provider]  haloperidol decanoate (HALDOL DECANOATE) 100 MG/ML injection Inject 100 mg into the muscle every 14 (fourteen) days.     Yes [provider]  hydrALAZINE (APRESOLINE) 100 MG tablet TAKE (1) TABLET BY MOUTH (3) TIMES DAILY. Patient taking differently: Take 100 mg by mouth 3 (three) times daily.  05/09/19  Yes Herminio Commons, MD  insulin glargine (LANTUS) 100 UNIT/ML injection Inject 20 Units into the skin See admin instructions. Twice daily For blood sugar levels over 100. Do not administer for blood sugar  levels lower than 100   Yes [provider]  loperamide (QC ANTI-DIARRHEAL) 2 MG tablet Take 2 mg by mouth 4 (four) times daily as needed for diarrhea or loose stools.   Yes [provider]  metoprolol tartrate (LOPRESSOR) 25 MG tablet Take 25 mg by mouth 2 (two) times daily.   Yes [provider]  minoxidil (LONITEN) 10 MG tablet Take 10 mg by mouth daily.   Yes [provider]  Multiple Vitamin (DAILY VITE PO) Take 1 tablet by mouth every morning.    Yes [provider]  NIFEdipine (ADALAT CC) 90 MG 24 hr tablet Take 90 mg by mouth daily.   Yes [provider]  nitroGLYCERIN (NITROSTAT) 0.4 MG SL tablet Place 0.4 mg under the tongue every 5 (five) minutes as needed for chest pain.   Yes [provider]  potassium chloride (KLOR-CON) 10 MEQ tablet TAKE (1) TABLET BY MOUTH ONCE DAILY. Patient taking differently: Take 10 mEq by mouth daily.  05/09/19  Yes Herminio Commons, MD  pravastatin (PRAVACHOL) 40 MG tablet Take 40 mg by mouth at bedtime.    Yes [provider]  sodium bicarbonate 650 MG tablet Take 650 mg by mouth 3 (three) times daily.   Yes [provider]  traZODone (DESYREL) 50 MG tablet Take 50 mg by mouth at bedtime.   Yes [provider]  acetaminophen (TYLENOL) 500 MG tablet Take 1 tablet (500 mg total) by mouth every 6 (six) hours as needed. 01/11/16   [name], PA-C  omeprazole (PRILOSEC) 20 MG capsule Take 1 capsule (20 mg total) by mouth daily. 07/25/19   Henderly, Britni A, PA-C  sucralfate (CARAFATE) 1 GM/10ML suspension Take 10 mLs (1 g total) by mouth 4 (four) times daily -  with meals and at bedtime. 07/25/19   Henderly, Britni A, PA-C    Physical Exam:    Physical Exam: Vitals:   07/25/19 2130 07/25/19 2132 07/25/19 2330 07/26/19 0107  BP: (!) 175/112 (!) 175/112 (!) 143/47 (!) 180/58  Pulse: 82   78  Resp: (!) 22     Temp:      TempSrc:      SpO2: 93%   92%  Weight:      Height:        Constitutional: NAD, calm, comfortable Vitals:   07/25/19 2130 07/25/19 2132 07/25/19 2330 07/26/19 0107  BP: (!) 175/112 (!) 175/112 (!) 143/47 (!) 180/58  Pulse: 82   78  Resp: (!) 22     Temp:      TempSrc:      SpO2: 93%   92%  Weight:      Height:       Eyes: PERRL, lids and conjunctivae normal ENMT: Mucous membranes are moist. Posterior pharynx clear of any exudate or lesions.Normal dentition.  Neck: normal, supple, no masses, no thyromegaly Respiratory: clear to auscultation bilaterally, no wheezing, no crackles. Normal respiratory effort. No accessory muscle use.  Cardiovascular: Regular rate and rhythm, no murmurs / rubs / gallops. No extremity edema. 2+ pedal pulses. No carotid bruits.  Abdomen: no tenderness, no  masses palpated. No hepatosplenomegaly. Bowel sounds positive.  Musculoskeletal: no clubbing / cyanosis. No joint deformity upper and lower extremities. Good ROM, no contractures. Normal muscle tone.  Skin: no rashes, lesions, ulcers. No induration Neurologic: CN 2-12 grossly intact. Sensation intact, DTR normal. Strength 5/5 in all 4.  Psychiatric: Normal judgment and insight. Alert and oriented x 3. Normal mood.  Decubitus Ulcers: Not present on admission Catheters and tubes: None  Data Review:    Labs on Admission: I have personally reviewed following labs and imaging studies  CBC: Recent Labs  Lab 07/25/19 1943  WBC 6.9  NEUTROABS 5.2  HGB 10.5*  HCT 33.9*  MCV 86.5  PLT 177   Basic Metabolic Panel: Recent Labs  Lab 07/25/19 1943  NA 139  K 4.2  CL 107  CO2 23  GLUCOSE 102*  BUN 40*  CREATININE 4.24*  CALCIUM 8.4*   GFR: Estimated Creatinine Clearance: 23.6 mL/min (A) (by C-G formula based on SCr of 4.24 mg/dL (H)). Liver Function Tests: Recent Labs  Lab 07/25/19 1943  AST 23  ALT 19  ALKPHOS 105  BILITOT 0.6  PROT 6.7  ALBUMIN 3.1*   Recent Labs  Lab 07/25/19 1943  LIPASE 27   No results for input(s): AMMONIA in the last 168 hours. Coagulation Profile: No results for input(s): INR, PROTIME in the last 168 hours. Cardiac Enzymes: No results for input(s): CKTOTAL, CKMB, CKMBINDEX, TROPONINI in the last 168 hours. BNP (last 3 results) No results for input(s): PROBNP in the last 8760 hours. HbA1C: No results for input(s): HGBA1C in the last 72 hours. CBG: No results for input(s): GLUCAP in the last 168 hours. Lipid Profile: No results for input(s): CHOL, HDL, LDLCALC, TRIG, CHOLHDL, LDLDIRECT in the last 72 hours. Thyroid Function Tests: No results for input(s): TSH, T4TOTAL, FREET4, T3FREE, THYROIDAB in the last 72 hours. Anemia Panel: No results for input(s): VITAMINB12, FOLATE, FERRITIN, TIBC, IRON, RETICCTPCT in the last 72 hours. Urine  analysis:    Component Value Date/Time   COLORURINE STRAW (A) 07/25/2019 2157   APPEARANCEUR CLEAR 07/25/2019 2157   LABSPEC 1.003 (L) 07/25/2019 2157   PHURINE 6.0 07/25/2019 2157   GLUCOSEU NEGATIVE 07/25/2019 2157   HGBUR NEGATIVE 07/25/2019 2157   BILIRUBINUR NEGATIVE 07/25/2019 2157   KETONESUR NEGATIVE 07/25/2019 2157   PROTEINUR >=300 (A) 07/25/2019 2157   UROBILINOGEN 0.2 12/17/2012 1644   NITRITE NEGATIVE 07/25/2019 2157   LEUKOCYTESUR NEGATIVE 07/25/2019 2157     Imaging Results:      Radiological Exams on Admission: DG Chest Portable 1 View  Result Date: 07/25/2019 CLINICAL DATA:  Elevated troponin EXAM: PORTABLE CHEST 1 VIEW COMPARISON:  12/26/2017 FINDINGS: Cardiomegaly.No overt edema or effusions. No acute bony abnormality. IMPRESSION: Cardiomegaly.  No active disease. Electronically Signed   By: Rolm Baptise M.D.   On: 07/25/2019 22:16      [facility] Ginette Otto MD [facility]  If 7PM-7AM, please contact night-coverage   07/26/2019, 2:29 AM

## 2019-07-26 NOTE — Consult Note (Signed)
Cardiology Consultation:   Patient ID: [name] MRN: [id number]; DOB: 08/21/1965  Admit date: 07/25/2019 Date of Consult: 07/26/2019  Primary Care Provider: Sinda Du, MD Primary Cardiologist: Kate Sable, MD  Primary Electrophysiologist:  None    Patient Profile:   [name] is a 54 y.o. male with a hx of CAD and HTN who is being seen today for the evaluation of chest pain at the request of Dr. Scherrie November.  History of Present Illness:   [name]  is a 54 y.o. male with a history of coronary artery disease having sustained a non-STEMI with a bare-metal stent to the LAD in April 2010. He also has essential hypertension, hyperlipidemia, insulin-dependent diabetes mellitus, and a history of tobacco abuse.  I last spoke with him on 05/15/2019 at which point he was doing well.  He was hypertensive so I switched metoprolol tartrate to carvedilol.  I also ordered an echocardiogram as he had moderate aortic regurgitation in November 2019.  He also had mild aortic stenosis at that time.  Echocardiogram on 05/21/2019 demonstrated normal LV systolic function and regional wall motion, LVEF 55 to 60%, severe LVH, grade 2 diastolic dysfunction, moderate left atrial dilatation, moderate aortic regurgitation, and mild to moderate aortic stenosis.  He presented to the ED with intermittent burning sensations in his epigastric region early this morning.  This occurred after eating a meal.  BP was elevated at 190/70 upon arrival and is 176/58 most recently.  Troponins peaked at 159 and are 146 most recently.  Creatinine is 4.2 with a BUN of 39.  He is mildly anemic with hemoglobin of 10.8.  Chest x-ray showed cardiomegaly and no active disease.  An abdominal ultrasound has been ordered and is pending.  An echocardiogram has also been ordered and is pending.  Upon speaking with him further, he told me he had been drinking a bottle of soda when he then developed  palpitations and epigastric discomfort and subsequent chest discomfort.  He took a Tylenol and symptoms resolved.  He currently denies chest pain, palpitations, and shortness of breath.  He is hoping he can go home.  He continues to smoke about 1/2 pack of cigarettes daily.  I personally reviewed the ECG which demonstrated sinus rhythm with LVH and repolarization abnormalities.  I compared this to the ECG performed on 02/13/2018 and there are no significant changes.  Past Medical History:  Diagnosis Date  . Aortic insufficiency    mild to moderate   . Arteriosclerotic cardiovascular disease (ASCVD)    EF 60% -- Non-Q MI in 07/2008->  BMS to mid LAD  . Axillary abscess 2011   right  . Cerebrovascular disease    CVA  . Chronic obstructive pulmonary disease ([place])   . Diabetes mellitus type II   . Diabetes mellitus, type 2 (HCC)    A1c of 5.6 in 05/2011  . Hyperlipidemia   . Hypertension   . Mild anemia    Result.  Nl H&H in 02/2011  . Obesity   . Schizophrenia ([place])    Schizophrenia/bipolar disease/mental retardation  . Sinus bradycardia    On low dose beta blocker  . Tobacco abuse    15 pack years; 0.5 pack per day    Past Surgical History:  Procedure Laterality Date  . HERNIA REPAIR  1967     Home Medications:  Prior to Admission medications   Medication Sig Start Date End Date Taking? Authorizing Provider  amLODipine (NORVASC) 10 MG tablet Take 10  mg by mouth every morning.    Yes [provider]  aspirin EC 81 MG tablet Take 81 mg by mouth every morning.    Yes [provider]  carvedilol (COREG) 3.125 MG tablet Take 1 tablet (3.125 mg total) by mouth 2 (two) times daily. 05/15/19 08/13/19 Yes Herminio Commons, MD  cholecalciferol (VITAMIN D3) 25 MCG (1000 UNIT) tablet Take 1,000 Units by mouth daily.   Yes [provider]  cloNIDine (CATAPRES) 0.1 MG tablet Take 1 tablet (0.1 mg total) by mouth 2 (two) times daily. 03/14/19  Yes  Herminio Commons, MD  diclofenac sodium (VOLTAREN) 1 % GEL Apply 2 g topically 4 (four) times daily.   Yes [provider]  fish oil-omega-3 fatty acids 1000 MG capsule Take 1 g by mouth in the morning, at noon, and at bedtime.    Yes [provider]  furosemide (LASIX) 20 MG tablet Take 20 mg by mouth in the morning and at bedtime.   Yes [provider]  haloperidol decanoate (HALDOL DECANOATE) 100 MG/ML injection Inject 100 mg into the muscle every 14 (fourteen) days.     Yes [provider]  hydrALAZINE (APRESOLINE) 100 MG tablet TAKE (1) TABLET BY MOUTH (3) TIMES DAILY. Patient taking differently: Take 100 mg by mouth 3 (three) times daily.  05/09/19  Yes Herminio Commons, MD  insulin glargine (LANTUS) 100 UNIT/ML injection Inject 20 Units into the skin See admin instructions. Twice daily For blood sugar levels over 100. Do not administer for blood sugar levels lower than 100   Yes [provider]  loperamide (QC ANTI-DIARRHEAL) 2 MG tablet Take 2 mg by mouth 4 (four) times daily as needed for diarrhea or loose stools.   Yes [provider]  metoprolol tartrate (LOPRESSOR) 25 MG tablet Take 25 mg by mouth 2 (two) times daily.   Yes [provider]  minoxidil (LONITEN) 10 MG tablet Take 10 mg by mouth daily.   Yes [provider]  Multiple Vitamin (DAILY VITE PO) Take 1 tablet by mouth every morning.    Yes [provider]  NIFEdipine (ADALAT CC) 90 MG 24 hr tablet Take 90 mg by mouth daily.   Yes [provider]  nitroGLYCERIN (NITROSTAT) 0.4 MG SL tablet Place 0.4 mg under the tongue every 5 (five) minutes as needed for chest pain.   Yes [provider]  potassium chloride (KLOR-CON) 10 MEQ tablet TAKE (1) TABLET BY MOUTH ONCE DAILY. Patient taking differently: Take 10 mEq by mouth daily.  05/09/19  Yes Herminio Commons, MD  pravastatin (PRAVACHOL) 40 MG tablet Take 40 mg by mouth at  bedtime.    Yes [provider]  sodium bicarbonate 650 MG tablet Take 650 mg by mouth 3 (three) times daily.   Yes [provider]  traZODone (DESYREL) 50 MG tablet Take 50 mg by mouth at bedtime.   Yes [provider]  acetaminophen (TYLENOL) 500 MG tablet Take 1 tablet (500 mg total) by mouth every 6 (six) hours as needed. 01/11/16   [name], PA-C  omeprazole (PRILOSEC) 20 MG capsule Take 1 capsule (20 mg total) by mouth daily. 07/25/19   Henderly, Britni A, PA-C  sucralfate (CARAFATE) 1 GM/10ML suspension Take 10 mLs (1 g total) by mouth 4 (four) times daily -  with meals and at bedtime. 07/25/19   Henderly, Britni A, PA-C    Inpatient Medications: Scheduled Meds: . aspirin EC  81 mg Oral  q morning - 10a  . carvedilol  3.125 mg Oral BID  . cholecalciferol  1,000 Units Oral Daily  . cloNIDine  0.1 mg Oral BID  . heparin  5,000 Units Subcutaneous Q8H  . hydrALAZINE  50 mg Oral TID  . insulin glargine  20 Units Subcutaneous BID  . multivitamin with minerals  1 tablet Oral q morning - 10a  . NIFEdipine  90 mg Oral Daily  . omega-3 acid ethyl esters  1 g Oral Daily  . pantoprazole (PROTONIX) IV  40 mg Intravenous Daily  . polyethylene glycol  17 g Oral Daily  . potassium chloride  10 mEq Oral Daily  . pravastatin  40 mg Oral QHS  . sodium bicarbonate  650 mg Oral TID  . sodium chloride flush  3 mL Intravenous Q12H  . traZODone  50 mg Oral QHS   Continuous Infusions: . sodium chloride     PRN Meds: sodium chloride, alum & mag hydroxide-simeth, bisacodyl, nitroGLYCERIN, ondansetron **OR** ondansetron (ZOFRAN) IV, polyethylene glycol, sodium chloride flush  Allergies:   No Known Allergies  Social History:   Social History   Socioeconomic History  . Marital status: Single    Spouse name: Not on file  . Number of children: Not on file  . Years of education: Not on file  . Highest education level: Not on file  Occupational History  . Occupation:  Disabled  Tobacco Use  . Smoking status: Current Every Day Smoker    Packs/day: 1.00    Years: 30.00    Pack years: 30.00    Types: Cigarettes    Start date: 12/03/1974  . Smokeless tobacco: Never Used  . Tobacco comment: Patient is not motivated to quit.  He attributes his smoking to the lack of other activities.  Substance and Sexual Activity  . Alcohol use: No    Alcohol/week: 0.0 standard drinks  . Drug use: No  . Sexual activity: Never  Other Topics Concern  . Not on file  Social History Narrative   Resides in an assisted-living facility   Social Determinants of Health   Financial Resource Strain:   . Difficulty of Paying Living Expenses:   Food Insecurity:   . Worried About Charity fundraiser in the Last Year:   . Arboriculturist in the Last Year:   Transportation Needs:   . Film/video editor (Medical):   Marland Kitchen Lack of Transportation (Non-Medical):   Physical Activity:   . Days of Exercise per Week:   . Minutes of Exercise per Session:   Stress:   . Feeling of Stress :   Social Connections:   . Frequency of Communication with Friends and Family:   . Frequency of Social Gatherings with Friends and Family:   . Attends Religious Services:   . Active Member of Clubs or Organizations:   . Attends Archivist Meetings:   Marland Kitchen Marital Status:   Intimate Partner Violence:   . Fear of Current or Ex-Partner:   . Emotionally Abused:   Marland Kitchen Physically Abused:   . Sexually Abused:     Family History:    Family History  Problem Relation Age of Onset  . Hypertension Other   . Arthritis Other      ROS:  Please see the history of present illness.   All other ROS reviewed and negative.     Physical Exam/Data:   Vitals:   07/26/19 0107 07/26/19 0130 07/26/19 0459 07/26/19 0900  BP: Marland Kitchen)  180/58 (!) 180/83 (!) 177/80 (!) 176/58  Pulse: 78  81 84  Resp:   18 17  Temp:   98.8 F (37.1 C)   TempSrc:   Oral   SpO2: 92%  98% 90%  Weight:   109.7 kg   Height:    5' 8.5" (1.74 m)     Intake/Output Summary (Last 24 hours) at 07/26/2019 1035 Last data filed at 07/26/2019 0900 Gross per 24 hour  Intake 240 ml  Output 1200 ml  Net -960 ml   Last 3 Weights 07/26/2019 07/25/2019 05/15/2019  Weight (lbs) 241 lb 13.5 oz 240 lb 241 lb  Weight (kg) 109.7 kg 108.863 kg 109.317 kg     Body mass index is 36.24 kg/m.  General:  Well nourished, well developed, in no acute distress HEENT: normal Lymph: no adenopathy Neck: no JVD Endocrine:  No thryomegaly Cardiac:  normal S1, S2; RRR; 2/6 systolic murmur over RUSB, 2/4 holodiastolic murmur along left sternal border Lungs: Bilateral expiratory wheezes. Abd: soft, nontender, no hepatomegaly  Ext: no edema Musculoskeletal:  No deformities, BUE and BLE strength normal and equal Skin: warm and dry  Neuro:  CNs 2-12 intact, no focal abnormalities noted Psych:  Normal affect   EKG reviewed above   Relevant CV Studies: Reviewed above  Laboratory Data:  High Sensitivity Troponin:   Recent Labs  Lab 07/25/19 2055 07/25/19 2259 07/26/19 0449 07/26/19 0842  TROPONINIHS 142* 149* 159* 146*     Chemistry Recent Labs  Lab 07/25/19 1943 07/26/19 0449  NA 139 141  K 4.2 4.1  CL 107 107  CO2 23 24  GLUCOSE 102* 106*  BUN 40* 39*  CREATININE 4.24* 4.20*  CALCIUM 8.4* 9.0  GFRNONAA 15* 15*  GFRAA 17* 17*  ANIONGAP 9 10    Recent Labs  Lab 07/25/19 1943  PROT 6.7  ALBUMIN 3.1*  AST 23  ALT 19  ALKPHOS 105  BILITOT 0.6   Hematology Recent Labs  Lab 07/25/19 1943 07/26/19 0449  WBC 6.9 6.2  RBC 3.92* 4.10*  HGB 10.5* 10.8*  HCT 33.9* 35.1*  MCV 86.5 85.6  MCH 26.8 26.3  MCHC 31.0 30.8  RDW 15.0 15.0  PLT 182 190   BNPNo results for input(s): BNP, PROBNP in the last 168 hours.  DDimer No results for input(s): DDIMER in the last 168 hours.   Radiology/Studies:  DG Chest Portable 1 View  Result Date: 07/25/2019 CLINICAL DATA:  Elevated troponin EXAM: PORTABLE CHEST 1 VIEW  COMPARISON:  12/26/2017 FINDINGS: Cardiomegaly.No overt edema or effusions. No acute bony abnormality. IMPRESSION: Cardiomegaly.  No active disease. Electronically Signed   By: Rolm Baptise M.D.   On: 07/25/2019 22:16         Assessment and Plan:   1.  Coronary artery disease/chest pain: Symptoms are atypical for anginal pain.  He has not had any symptom recurrence.  Troponins are nonspecifically elevated in the context of significantly elevated creatinine.  ECG shows sinus rhythm with LVH and repolarization abnormalities which are unchanged from November 2019.  A repeat echocardiogram has been ordered by internal medicine and is pending interpretation.  Continue aspirin, beta-blocker, and statin.  I will increase the dose of carvedilol to 6.25 mg twice daily for more optimal blood pressure control. No plans for ischemic testing.  2. Essential HTN with hypertensive heart disease/severe QDI:[id number].  I will switch metoprolol to carvedilol 3.125 mg twice daily for more optimal blood pressure control.  He has been started on  nifedipine by internal medicine.  He is also on hydralazine 50 mg 3 times daily and clonidine 0.1 mg twice daily.  3. Hyperlipidemia:Continue pravastatin40 mg.  4. Chronic kidney disease stage IV/V: Creatinine now in the 4.2 range.  He needs follow-up with nephrology.  5. Aortic regurgitation: Moderate in severity by echocardiogram in February 2021.  6.  Aortic stenosis: Mild to moderate in severity by echocardiogram in February 2021.    [facility] will sign off.   Medication Recommendations:  As above Other recommendations (labs, testing, etc):  NA Follow up as an outpatient:  As scheduled  For questions or updates, please contact [place] Please consult [email or web address] for contact info under     Signed, Kate Sable, MD  07/26/2019 10:35 AM

## 2019-07-28 ENCOUNTER — Encounter (HOSPITAL_COMMUNITY): Payer: Self-pay | Admitting: Emergency Medicine

## 2019-07-28 ENCOUNTER — Emergency Department (HOSPITAL_COMMUNITY): Payer: Medicaid Other

## 2019-07-28 ENCOUNTER — Other Ambulatory Visit: Payer: Self-pay

## 2019-07-28 ENCOUNTER — Observation Stay (HOSPITAL_COMMUNITY)
Admission: EM | Admit: 2019-07-28 | Discharge: 2019-07-29 | Disposition: A | Discharge status: Home / Self Care | Payer: Medicaid Other | Attending: Internal Medicine | Admitting: Internal Medicine

## 2019-07-28 DIAGNOSIS — F17210 Nicotine dependence, cigarettes, uncomplicated: Secondary | ICD-10-CM | POA: Insufficient documentation

## 2019-07-28 DIAGNOSIS — J9601 Acute respiratory failure with hypoxia: Secondary | ICD-10-CM | POA: Diagnosis not present

## 2019-07-28 DIAGNOSIS — J441 Chronic obstructive pulmonary disease with (acute) exacerbation: Secondary | ICD-10-CM | POA: Diagnosis not present

## 2019-07-28 DIAGNOSIS — Z79899 Other long term (current) drug therapy: Secondary | ICD-10-CM | POA: Insufficient documentation

## 2019-07-28 DIAGNOSIS — E119 Type 2 diabetes mellitus without complications: Secondary | ICD-10-CM | POA: Insufficient documentation

## 2019-07-28 DIAGNOSIS — N182 Chronic kidney disease, stage 2 (mild): Secondary | ICD-10-CM | POA: Insufficient documentation

## 2019-07-28 DIAGNOSIS — Z794 Long term (current) use of insulin: Secondary | ICD-10-CM | POA: Insufficient documentation

## 2019-07-28 DIAGNOSIS — I1310 Hypertensive heart and chronic kidney disease without heart failure, with stage 1 through stage 4 chronic kidney disease, or unspecified chronic kidney disease: Secondary | ICD-10-CM | POA: Diagnosis not present

## 2019-07-28 DIAGNOSIS — I2510 Atherosclerotic heart disease of native coronary artery without angina pectoris: Secondary | ICD-10-CM | POA: Diagnosis not present

## 2019-07-28 DIAGNOSIS — R0602 Shortness of breath: Secondary | ICD-10-CM | POA: Diagnosis present

## 2019-07-28 LAB — BASIC METABOLIC PANEL
Anion gap: 8 (ref 5–15)
BUN: 37 mg/dL — ABNORMAL HIGH (ref 6–20)
CO2: 24 mmol/L (ref 22–32)
Calcium: 8.4 mg/dL — ABNORMAL LOW (ref 8.9–10.3)
Chloride: 109 mmol/L (ref 98–111)
Creatinine, Ser: 4.58 mg/dL — ABNORMAL HIGH (ref 0.61–1.24)
GFR calc Af Amer: 16 mL/min — ABNORMAL LOW (ref >60)
GFR calc non Af Amer: 13 mL/min — ABNORMAL LOW (ref >60)
Glucose, Bld: 126 mg/dL — ABNORMAL HIGH (ref 70–99)
Potassium: 4.2 mmol/L (ref 3.5–5.1)
Sodium: 141 mmol/L (ref 135–145)

## 2019-07-28 LAB — CBC WITH DIFFERENTIAL/PLATELET
Abs Immature Granulocytes: 0.03 10*3/uL (ref 0.00–0.07)
Basophils Absolute: 0 10*3/uL (ref 0.0–0.1)
Basophils Relative: 0 %
Eosinophils Absolute: 0.2 10*3/uL (ref 0.0–0.5)
Eosinophils Relative: 3 %
HCT: 31.6 % — ABNORMAL LOW (ref 39.0–52.0)
Hemoglobin: 9.6 g/dL — ABNORMAL LOW (ref 13.0–17.0)
Immature Granulocytes: 0 %
Lymphocytes Relative: 10 %
Lymphs Abs: 0.8 10*3/uL (ref 0.7–4.0)
MCH: 26.5 pg (ref 26.0–34.0)
MCHC: 30.4 g/dL (ref 30.0–36.0)
MCV: 87.3 fL (ref 80.0–100.0)
Monocytes Absolute: 0.5 10*3/uL (ref 0.1–1.0)
Monocytes Relative: 6 %
Neutro Abs: 6.6 10*3/uL (ref 1.7–7.7)
Neutrophils Relative %: 81 %
Platelets: 192 10*3/uL (ref 150–400)
RBC: 3.62 MIL/uL — ABNORMAL LOW (ref 4.22–5.81)
RDW: 15.2 % (ref 11.5–15.5)
WBC: 8.2 10*3/uL (ref 4.0–10.5)
nRBC: 0 % (ref 0.0–0.2)

## 2019-07-28 LAB — GLUCOSE, CAPILLARY
Glucose-Capillary: 209 mg/dL — ABNORMAL HIGH (ref 70–99)
Glucose-Capillary: 139 mg/dL — ABNORMAL HIGH (ref 70–99)

## 2019-07-28 LAB — LACTIC ACID, PLASMA: Lactic Acid, Venous: 0.6 mmol/L (ref 0.5–1.9)

## 2019-07-28 LAB — TROPONIN I (HIGH SENSITIVITY)
Troponin I (High Sensitivity): 98 ng/L — ABNORMAL HIGH (ref <18)
Troponin I (High Sensitivity): 112 ng/L (ref <18)

## 2019-07-28 LAB — CBG MONITORING, ED: Glucose-Capillary: 152 mg/dL — ABNORMAL HIGH (ref 70–99)

## 2019-07-28 LAB — BRAIN NATRIURETIC PEPTIDE: B Natriuretic Peptide: 97 pg/mL (ref 0.0–100.0)

## 2019-07-28 MED ORDER — IPRATROPIUM-ALBUTEROL 0.5-2.5 (3) MG/3ML IN SOLN
3.0000 mL | Freq: Four times a day (QID) | RESPIRATORY_TRACT | Status: DC
  Administered 2019-07-28 – 2019-07-29 (×4): 3 mL via RESPIRATORY_TRACT
  Filled 2019-07-28 (×4): qty 3

## 2019-07-28 MED ORDER — METHYLPREDNISOLONE SODIUM SUCC 125 MG IJ SOLR
125.0000 mg | Freq: Once | INTRAMUSCULAR | Status: AC
  Administered 2019-07-28: 10:00:00 125 mg via INTRAVENOUS
  Filled 2019-07-28: qty 2

## 2019-07-28 MED ORDER — ACETAMINOPHEN 325 MG PO TABS: 650.0000 mg | ORAL_TABLET | Freq: Four times a day (QID) | ORAL | Status: DC | PRN

## 2019-07-28 MED ORDER — NITROGLYCERIN 0.4 MG SL SUBL: 0.4000 mg | SUBLINGUAL_TABLET | SUBLINGUAL | Status: DC | PRN

## 2019-07-28 MED ORDER — ONDANSETRON HCL 4 MG PO TABS: 4.0000 mg | ORAL_TABLET | Freq: Four times a day (QID) | ORAL | Status: DC | PRN

## 2019-07-28 MED ORDER — TRAZODONE HCL 50 MG PO TABS
50.0000 mg | ORAL_TABLET | Freq: Every day | ORAL | Status: DC
  Administered 2019-07-28: 23:00:00 50 mg via ORAL
  Filled 2019-07-28: qty 1

## 2019-07-28 MED ORDER — ALBUTEROL SULFATE HFA 108 (90 BASE) MCG/ACT IN AERS
8.0000 | INHALATION_SPRAY | RESPIRATORY_TRACT | Status: DC | PRN
  Administered 2019-07-28: 8 via RESPIRATORY_TRACT
  Filled 2019-07-28: qty 6.7

## 2019-07-28 MED ORDER — INSULIN GLARGINE 100 UNIT/ML ~~LOC~~ SOLN
20.0000 [IU] | Freq: Two times a day (BID) | SUBCUTANEOUS | Status: DC
  Administered 2019-07-28 – 2019-07-29 (×3): 20 [IU] via SUBCUTANEOUS
  Filled 2019-07-28 (×5): qty 0.2

## 2019-07-28 MED ORDER — SODIUM CHLORIDE 0.9% FLUSH: 3.0000 mL | INTRAVENOUS | Status: DC | PRN

## 2019-07-28 MED ORDER — POTASSIUM CHLORIDE ER 10 MEQ PO TBCR
10.0000 meq | EXTENDED_RELEASE_TABLET | Freq: Every day | ORAL | Status: DC
  Administered 2019-07-28: 14:00:00 10 meq via ORAL
  Filled 2019-07-28 (×2): qty 1

## 2019-07-28 MED ORDER — SODIUM BICARBONATE 650 MG PO TABS
650.0000 mg | ORAL_TABLET | Freq: Three times a day (TID) | ORAL | Status: DC
  Administered 2019-07-28 – 2019-07-29 (×3): 650 mg via ORAL
  Filled 2019-07-28 (×6): qty 1

## 2019-07-28 MED ORDER — SUCRALFATE 1 GM/10ML PO SUSP
1.0000 g | Freq: Three times a day (TID) | ORAL | Status: DC
  Administered 2019-07-28 – 2019-07-29 (×4): 1 g via ORAL
  Filled 2019-07-28 (×5): qty 10

## 2019-07-28 MED ORDER — SODIUM CHLORIDE 0.9 % IV SOLN: 250.0000 mL | INTRAVENOUS | Status: DC | PRN

## 2019-07-28 MED ORDER — AMLODIPINE BESYLATE 5 MG PO TABS: 10.0000 mg | ORAL_TABLET | Freq: Every day | ORAL | Status: DC

## 2019-07-28 MED ORDER — CLONIDINE HCL 0.1 MG PO TABS
0.1000 mg | ORAL_TABLET | Freq: Two times a day (BID) | ORAL | Status: DC
  Administered 2019-07-28 – 2019-07-29 (×3): 0.1 mg via ORAL
  Filled 2019-07-28 (×3): qty 1

## 2019-07-28 MED ORDER — FUROSEMIDE 40 MG PO TABS
40.0000 mg | ORAL_TABLET | Freq: Every day | ORAL | Status: DC
  Administered 2019-07-28 – 2019-07-29 (×2): 40 mg via ORAL
  Filled 2019-07-28 (×2): qty 1

## 2019-07-28 MED ORDER — INSULIN ASPART 100 UNIT/ML ~~LOC~~ SOLN: 0.0000 [IU] | Freq: Every day | SUBCUTANEOUS | Status: DC

## 2019-07-28 MED ORDER — NIFEDIPINE ER OSMOTIC RELEASE 30 MG PO TB24
90.0000 mg | ORAL_TABLET | Freq: Every day | ORAL | Status: DC
  Administered 2019-07-28 – 2019-07-29 (×2): 90 mg via ORAL
  Filled 2019-07-28 (×2): qty 3

## 2019-07-28 MED ORDER — ASPIRIN EC 81 MG PO TBEC
81.0000 mg | DELAYED_RELEASE_TABLET | Freq: Every day | ORAL | Status: DC
  Administered 2019-07-29: 10:00:00 81 mg via ORAL
  Filled 2019-07-28 (×3): qty 1

## 2019-07-28 MED ORDER — PANTOPRAZOLE SODIUM 40 MG PO TBEC
40.0000 mg | DELAYED_RELEASE_TABLET | Freq: Every day | ORAL | Status: DC
  Administered 2019-07-28 – 2019-07-29 (×2): 40 mg via ORAL
  Filled 2019-07-28 (×2): qty 1

## 2019-07-28 MED ORDER — ALBUTEROL SULFATE (2.5 MG/3ML) 0.083% IN NEBU: 2.5000 mg | INHALATION_SOLUTION | RESPIRATORY_TRACT | Status: DC | PRN

## 2019-07-28 MED ORDER — ALBUTEROL SULFATE (2.5 MG/3ML) 0.083% IN NEBU
INHALATION_SOLUTION | RESPIRATORY_TRACT | Status: AC
  Administered 2019-07-28: 2.5 mg
  Filled 2019-07-28: qty 3

## 2019-07-28 MED ORDER — ONDANSETRON HCL 4 MG/2ML IJ SOLN: 4.0000 mg | Freq: Four times a day (QID) | INTRAMUSCULAR | Status: DC | PRN

## 2019-07-28 MED ORDER — DICLOFENAC SODIUM 1 % TD GEL
2.0000 g | Freq: Four times a day (QID) | TRANSDERMAL | Status: DC
  Administered 2019-07-29: 10:00:00 2 g via TOPICAL
  Filled 2019-07-28: qty 100

## 2019-07-28 MED ORDER — SODIUM CHLORIDE 0.9% FLUSH
3.0000 mL | Freq: Two times a day (BID) | INTRAVENOUS | Status: DC
  Administered 2019-07-29: 10:00:00 3 mL via INTRAVENOUS

## 2019-07-28 MED ORDER — ACETAMINOPHEN 650 MG RE SUPP: 650.0000 mg | Freq: Four times a day (QID) | RECTAL | Status: DC | PRN

## 2019-07-28 MED ORDER — CARVEDILOL 3.125 MG PO TABS
6.2500 mg | ORAL_TABLET | Freq: Two times a day (BID) | ORAL | Status: DC
  Administered 2019-07-28 – 2019-07-29 (×3): 6.25 mg via ORAL
  Filled 2019-07-28 (×2): qty 2
  Filled 2019-07-28: qty 1

## 2019-07-28 MED ORDER — MINOXIDIL 10 MG PO TABS
10.0000 mg | ORAL_TABLET | Freq: Every day | ORAL | Status: DC
  Filled 2019-07-28 (×2): qty 1

## 2019-07-28 MED ORDER — BUDESONIDE 0.25 MG/2ML IN SUSP
0.2500 mg | Freq: Two times a day (BID) | RESPIRATORY_TRACT | Status: DC
  Administered 2019-07-28 – 2019-07-29 (×3): 0.25 mg via RESPIRATORY_TRACT
  Filled 2019-07-28 (×3): qty 2

## 2019-07-28 MED ORDER — DOXYCYCLINE HYCLATE 100 MG PO CAPS
100.0000 mg | ORAL_CAPSULE | Freq: Two times a day (BID) | ORAL | 0 refills | Status: DC
Start: 2019-07-28 — End: 2019-07-29

## 2019-07-28 MED ORDER — HYDRALAZINE HCL 25 MG PO TABS
100.0000 mg | ORAL_TABLET | Freq: Three times a day (TID) | ORAL | Status: DC
  Administered 2019-07-28 – 2019-07-29 (×3): 100 mg via ORAL
  Filled 2019-07-28 (×4): qty 4

## 2019-07-28 MED ORDER — ISOSORBIDE MONONITRATE ER 30 MG PO TB24
30.0000 mg | ORAL_TABLET | Freq: Every day | ORAL | Status: DC
  Filled 2019-07-28 (×2): qty 1

## 2019-07-28 MED ORDER — INSULIN ASPART 100 UNIT/ML ~~LOC~~ SOLN
0.0000 [IU] | Freq: Three times a day (TID) | SUBCUTANEOUS | Status: DC
  Administered 2019-07-28: 14:00:00 2 [IU] via SUBCUTANEOUS
  Administered 2019-07-28: 16:00:00 3 [IU] via SUBCUTANEOUS
  Filled 2019-07-28: qty 1

## 2019-07-28 MED ORDER — PRAVASTATIN SODIUM 40 MG PO TABS
40.0000 mg | ORAL_TABLET | Freq: Every day | ORAL | Status: DC
  Administered 2019-07-28: 23:00:00 40 mg via ORAL
  Filled 2019-07-28 (×2): qty 1

## 2019-07-28 MED ORDER — HEPARIN SODIUM (PORCINE) 5000 UNIT/ML IJ SOLN
5000.0000 [IU] | Freq: Three times a day (TID) | INTRAMUSCULAR | Status: DC
  Administered 2019-07-28 – 2019-07-29 (×3): 5000 [IU] via SUBCUTANEOUS
  Filled 2019-07-28 (×3): qty 1

## 2019-07-28 MED ORDER — VITAMIN D 25 MCG (1000 UNIT) PO TABS
1000.0000 [IU] | ORAL_TABLET | Freq: Every day | ORAL | Status: DC
  Administered 2019-07-29: 1000 [IU] via ORAL
  Filled 2019-07-28 (×2): qty 1

## 2019-07-28 NOTE — ED Notes (Signed)
Pt 88% on room air laying in bed. Ambulated pt in hallway without oxygen. Oxygen sat dropped to 86%. Wheezing noted. Pt denies sob.  MD notified. Pt placed back on [facility]

## 2019-07-28 NOTE — ED Triage Notes (Signed)
[facility] - Pt from [facility] with c/o SOB. Per EMS, O2 was initially 85% on RA. Placed on 3LPM via [place] O2 increased to 93%. Pt used inhaler and stated that it helped. Pt just seen here on 4/22

## 2019-07-28 NOTE — ED Notes (Signed)
ED Provider at bedside. 

## 2019-07-28 NOTE — ED Provider Notes (Signed)
Patient became hypoxic on room air.  He was admitted for oxygen requirement from COPD   Milton Ferguson, MD 07/28/19 1007

## 2019-07-28 NOTE — ED Provider Notes (Signed)
[facility] EMERGENCY DEPARTMENT Provider Note   CSN: [id number] Arrival date & time: 07/28/19  0254     History Chief Complaint  Patient presents with  . Shortness of Breath    [name] is a 54 y.o. male.  Patient presents to the emergency department for evaluation of shortness of breath.  Patient does have a known history of COPD.  He reports that he has had cough and has been producing green sputum.  He became more short of breath tonight.  He was given albuterol and reports improvement.  No associated chest pain.  No fever.        Past Medical History:  Diagnosis Date  . Aortic insufficiency    mild to moderate   . Arteriosclerotic cardiovascular disease (ASCVD)    EF 60% -- Non-Q MI in 07/2008->  BMS to mid LAD  . Axillary abscess 2011   right  . Cerebrovascular disease    CVA  . Chronic obstructive pulmonary disease ([place])   . Diabetes mellitus type II   . Diabetes mellitus, type 2 (HCC)    A1c of 5.6 in 05/2011  . Hyperlipidemia   . Hypertension   . Mild anemia    Result.  Nl H&H in 02/2011  . Obesity   . Schizophrenia ([place])    Schizophrenia/bipolar disease/mental retardation  . Sinus bradycardia    On low dose beta blocker  . Tobacco abuse    15 pack years; 0.5 pack per day    Patient Active Problem List   Diagnosis Date Noted  . Elevated troponin 07/26/2019  . Vomiting 10/02/2018  . Acute on chronic renal failure ([place]) 02/06/2018  . Schizoaffective disorder, bipolar type ([place]) 01/04/2016  . Acute renal failure ([place]) 12/17/2012  . Dehydration 12/17/2012  . Diarrhea 12/17/2012  . Gastroenteritis 12/17/2012  . GERD (gastroesophageal reflux disease) 12/17/2012  . Metabolic acidosis 75/91/6384  . Chronic kidney disease, stage 2, mildly decreased GFR 03/19/2012  . CAD (coronary artery disease)   . Schizophrenia ([place])   . Tobacco abuse   . Diabetes mellitus, type 2 ([place])   . Hypertension   . Hyperlipidemia   . Aortic insufficiency      Past Surgical History:  Procedure Laterality Date  . HERNIA REPAIR  1967       Family History  Problem Relation Age of Onset  . Hypertension Other   . Arthritis Other     Social History   Tobacco Use  . Smoking status: Current Every Day Smoker    Packs/day: 1.00    Years: 30.00    Pack years: 30.00    Types: Cigarettes    Start date: 12/03/1974  . Smokeless tobacco: Never Used  . Tobacco comment: Patient is not motivated to quit.  He attributes his smoking to the lack of other activities.  Substance Use Topics  . Alcohol use: No    Alcohol/week: 0.0 standard drinks  . Drug use: No    Home Medications Prior to Admission medications   Medication Sig Start Date End Date Taking? Authorizing Provider  acetaminophen (TYLENOL) 500 MG tablet Take 1 tablet (500 mg total) by mouth every 6 (six) hours as needed. 01/11/16   [name], PA-C  amLODipine (NORVASC) 10 MG tablet Take 1 tablet (10 mg total) by mouth daily. 07/26/19   Roxan Hockey, MD  aspirin EC 81 MG tablet Take 1 tablet (81 mg total) by mouth daily with breakfast. 07/26/19   Roxan Hockey, MD  carvedilol (COREG)  6.25 MG tablet Take 1 tablet (6.25 mg total) by mouth 2 (two) times daily. 07/26/19   Shon Hale, MD  cholecalciferol (VITAMIN D3) 25 MCG (1000 UNIT) tablet Take 1,000 Units by mouth daily.    [provider]  cloNIDine (CATAPRES) 0.1 MG tablet Take 1 tablet (0.1 mg total) by mouth 2 (two) times daily. 07/26/19   Shon Hale, MD  diclofenac sodium (VOLTAREN) 1 % GEL Apply 2 g topically 4 (four) times daily.    [provider]  fish oil-omega-3 fatty acids 1000 MG capsule Take 1 g by mouth in the morning, at noon, and at bedtime.     [provider]  furosemide (LASIX) 40 MG tablet Take 1 tablet (40 mg total) by mouth daily. For Fluid 07/26/19   Shon Hale, MD  haloperidol decanoate (HALDOL DECANOATE) 100 MG/ML injection Inject 100 mg into the muscle every 14  (fourteen) days.      [provider]  hydrALAZINE (APRESOLINE) 100 MG tablet Take 1 tablet (100 mg total) by mouth 3 (three) times daily. 07/26/19   Shon Hale, MD  insulin glargine (LANTUS) 100 UNIT/ML injection Inject 20 Units into the skin See admin instructions. Twice daily For blood sugar levels over 100. Do not administer for blood sugar levels lower than 100    [provider]  isosorbide mononitrate (IMDUR) 30 MG 24 hr tablet Take 1 tablet (30 mg total) by mouth daily. 07/26/19 07/25/20  Shon Hale, MD  loperamide (QC ANTI-DIARRHEAL) 2 MG tablet Take 2 mg by mouth 4 (four) times daily as needed for diarrhea or loose stools.    [provider]  minoxidil (LONITEN) 10 MG tablet Take 1 tablet (10 mg total) by mouth daily. 07/26/19   Shon Hale, MD  Multiple Vitamin (DAILY VITE PO) Take 1 tablet by mouth every morning.     [provider]  NIFEdipine (ADALAT CC) 90 MG 24 hr tablet Take 1 tablet (90 mg total) by mouth daily. 07/26/19   Shon Hale, MD  nitroGLYCERIN (NITROSTAT) 0.4 MG SL tablet Place 0.4 mg under the tongue every 5 (five) minutes as needed for chest pain.    [provider]  omeprazole (PRILOSEC) 20 MG capsule Take 1 capsule (20 mg total) by mouth daily. 07/25/19   Henderly, Britni A, PA-C  potassium chloride (KLOR-CON) 10 MEQ tablet TAKE (1) TABLET BY MOUTH ONCE DAILY. 07/26/19   Shon Hale, MD  pravastatin (PRAVACHOL) 40 MG tablet Take 1 tablet (40 mg total) by mouth at bedtime. 07/26/19   Shon Hale, MD  sodium bicarbonate 650 MG tablet Take 650 mg by mouth 3 (three) times daily.    [provider]  sucralfate (CARAFATE) 1 GM/10ML suspension Take 10 mLs (1 g total) by mouth 4 (four) times daily -  with meals and at bedtime. 07/25/19   Henderly, Britni A, PA-C  traZODone (DESYREL) 50 MG tablet Take 50 mg by mouth at bedtime.    [provider]    Allergies    Patient has no known  allergies.  Review of Systems   Review of Systems  Respiratory: Positive for cough, shortness of breath and wheezing.   All other systems reviewed and are negative.   Physical Exam Updated Vital Signs BP (!) 176/82   Pulse 78   Temp 98.6 F (37 C)   Resp 16   Ht 5' 8.5" (1.74 m)   Wt 109.7 kg   SpO2 94%   BMI 36.24 kg/m  Physical Exam Vitals and nursing note reviewed.  Constitutional:      General: He is not in acute distress.    Appearance: Normal appearance. He is well-developed.  HENT:     Head: Normocephalic and atraumatic.     Right Ear: Hearing normal.     Left Ear: Hearing normal.     Nose: Nose normal.  Eyes:     Conjunctiva/sclera: Conjunctivae normal.     Pupils: Pupils are equal, round, and reactive to light.  Cardiovascular:     Rate and Rhythm: Regular rhythm.     Heart sounds: S1 normal and S2 normal. No murmur. No friction rub. No gallop.   Pulmonary:     Effort: Pulmonary effort is normal. No respiratory distress.     Breath sounds: Wheezing present.  Chest:     Chest wall: No tenderness.  Abdominal:     General: Bowel sounds are normal.     Palpations: Abdomen is soft.     Tenderness: There is no abdominal tenderness. There is no guarding or rebound. Negative signs include Murphy's sign and McBurney's sign.     Hernia: No hernia is present.  Musculoskeletal:        General: Normal range of motion.     Cervical back: Normal range of motion and neck supple.  Skin:    General: Skin is warm and dry.     Findings: No rash.  Neurological:     Mental Status: He is alert and oriented to person, place, and time.     GCS: GCS eye subscore is 4. GCS verbal subscore is 5. GCS motor subscore is 6.     Cranial Nerves: No cranial nerve deficit.     Sensory: No sensory deficit.     Coordination: Coordination normal.  Psychiatric:        Speech: Speech normal.        Behavior: Behavior normal.        Thought Content: Thought content normal.     ED  Results / Procedures / Treatments   Labs (all labs ordered are listed, but only abnormal results are displayed) Labs Reviewed  CBC WITH DIFFERENTIAL/PLATELET - Abnormal; Notable for the following components:      Result Value   RBC 3.62 (*)    Hemoglobin 9.6 (*)    HCT 31.6 (*)    All other components within normal limits  BASIC METABOLIC PANEL - Abnormal; Notable for the following components:   Glucose, Bld 126 (*)    BUN 37 (*)    Creatinine, Ser 4.58 (*)    Calcium 8.4 (*)    GFR calc non Af Amer 13 (*)    GFR calc Af Amer 16 (*)    All other components within normal limits  TROPONIN I (HIGH SENSITIVITY) - Abnormal; Notable for the following components:   Troponin I (High Sensitivity) 98 (*)    All other components within normal limits  TROPONIN I (HIGH SENSITIVITY) - Abnormal; Notable for the following components:   Troponin I (High Sensitivity) 112 (*)    All other components within normal limits  LACTIC ACID, PLASMA  BRAIN NATRIURETIC PEPTIDE    EKG EKG Interpretation  Date/Time:  Sunday July 28 2019 03:03:05 EDT Ventricular Rate:  93 PR Interval:    QRS Duration: 93 QT Interval:  380 QTC Calculation: 473 R Axis:   24 Text Interpretation: Sinus rhythm Probable LVH with secondary repol abnrm Confirmed by Orpah Greek [phone]) on 07/28/2019 3:10:35  AM   Radiology DG Chest Port 1 View  Result Date: 07/28/2019 CLINICAL DATA:  Shortness of breath. EXAM: PORTABLE CHEST 1 VIEW COMPARISON:  None. FINDINGS: Stable moderate severity increased interstitial lung markings are seen. Very mild right basilar atelectasis and/or early infiltrate is noted. There is no evidence of a pleural effusion or pneumothorax. The cardiac silhouette is mildly enlarged and unchanged in size. Multilevel degenerative changes seen throughout the thoracic spine. IMPRESSION: Stable moderate severity increased interstitial lung markings with very mild right basilar atelectasis and/or early  infiltrate. Electronically Signed   By: Virgina Norfolk M.D.   On: 07/28/2019 03:53   ECHOCARDIOGRAM COMPLETE  Result Date: 07/26/2019    ECHOCARDIOGRAM REPORT   Patient Name:   REDFORD BEHRLE Date of Exam: 07/26/2019 Medical Rec #:  [id number]          Height:       68.5 in Accession #:    [id number]         Weight:       241.8 lb Date of Birth:  [birth year]          BSA:          2.228 m Patient Age:    78 years           BP:           176/58 mmHg Patient Gender: M                  HR:           84 bpm. Exam Location:  [facility] Procedure: 2D Echo Indications:    CAD Native Vessel 414.01 / I25.10  History:        Patient has prior history of Echocardiogram examinations, most                 recent 05/21/2019. CAD; Risk Factors:Hypertension, Diabetes,                 Dyslipidemia and Current Smoker. Schizophrenia, GERD, Elevated                 Troponin, Acute renal failure.  Sonographer:    Leavy Cella RDCS (AE) Referring Phys: [place]  1. Left ventricular ejection fraction, by estimation, is 60 to 65%. The left ventricle has normal function. The left ventricle has no regional wall motion abnormalities. There is severe left ventricular hypertrophy. Left ventricular diastolic parameters  are indeterminate.  2. Right ventricular systolic function is normal. The right ventricular size is normal.  3. The mitral valve is abnormal. No evidence of mitral valve regurgitation. No evidence of mitral stenosis.  4. The aortic valve has an indeterminant number of cusps. Aortic valve regurgitation is moderate. Mild to moderate aortic valve stenosis.  5. The inferior vena cava is normal in size with greater than 50% respiratory variability, suggesting right atrial pressure of 3 mmHg. FINDINGS  Left Ventricle: Left ventricular ejection fraction, by estimation, is 60 to 65%. The left ventricle has normal function. The left ventricle has no regional wall motion abnormalities. The left  ventricular internal cavity size was normal in size. There is  severe left ventricular hypertrophy. Left ventricular diastolic parameters are indeterminate. Right Ventricle: The right ventricular size is normal. No increase in right ventricular wall thickness. Right ventricular systolic function is normal. Left Atrium: Left atrial size was normal in size. Right Atrium: Right atrial size was normal in size. Pericardium: A small pericardial effusion is present.  The pericardial effusion is circumferential. Mitral Valve: The mitral valve is abnormal. There is moderate thickening of the mitral valve leaflet(s). There is moderate calcification of the mitral valve leaflet(s). Moderate mitral annular calcification. No evidence of mitral valve regurgitation. No evidence of mitral valve stenosis. Tricuspid Valve: The tricuspid valve is normal in structure. Tricuspid valve regurgitation is not demonstrated. No evidence of tricuspid stenosis. Aortic Valve: The aortic valve has an indeterminant number of cusps. . There is moderate thickening and moderate calcification of the aortic valve. Aortic valve regurgitation is moderate. Aortic regurgitation PHT measures 419 msec. Mild to moderate aortic stenosis is present. Moderate aortic valve annular calcification. There is moderate thickening of the aortic valve. There is moderate calcification of the aortic valve. Aortic valve mean gradient measures 16.7 mmHg. Aortic valve peak gradient measures 33.2 mmHg. Aortic valve area, by VTI measures 1.39 cm. Pulmonic Valve: The pulmonic valve was not well visualized. Pulmonic valve regurgitation is mild. No evidence of pulmonic stenosis. Aorta: The aortic root is normal in size and structure. Pulmonary Artery: Indeterminant PASP, inadequate TR jet. Venous: The inferior vena cava is normal in size with greater than 50% respiratory variability, suggesting right atrial pressure of 3 mmHg. IAS/Shunts: The interatrial septum was not well  visualized.  LEFT VENTRICLE PLAX 2D LVIDd:         5.53 cm LVIDs:         2.89 cm LV PW:         1.69 cm LV IVS:        1.62 cm LVOT diam:     1.90 cm LV SV:         76 LV SV Index:   34 LVOT Area:     2.84 cm  RIGHT VENTRICLE RV S prime:     20.80 cm/s TAPSE (M-mode): 2.9 cm LEFT ATRIUM             Index LA diam:        3.80 cm 1.71 cm/m LA Vol (A2C):   73.0 ml 32.77 ml/m LA Vol (A4C):   37.1 ml 16.65 ml/m LA Biplane Vol: 54.8 ml 24.60 ml/m  AORTIC VALVE AV Area (Vmax):    1.29 cm AV Area (Vmean):   1.40 cm AV Area (VTI):     1.39 cm AV Vmax:           288.19 cm/s AV Vmean:          188.160 cm/s AV VTI:            0.546 m AV Peak Grad:      33.2 mmHg AV Mean Grad:      16.7 mmHg LVOT Vmax:         131.40 cm/s LVOT Vmean:        93.200 cm/s LVOT VTI:          0.267 m LVOT/AV VTI ratio: 0.49 AI PHT:            419 msec  AORTA Ao Root diam: 3.10 cm MITRAL VALVE MV Area (PHT): 2.90 cm     SHUNTS MV Decel Time: 262 msec     Systemic VTI:  0.27 m MV E velocity: 109.00 cm/s  Systemic Diam: 1.90 cm MV A velocity: 92.40 cm/s MV E/A ratio:  1.18 Carlyle Dolly MD Electronically signed by Carlyle Dolly MD Signature Date/Time: 07/26/2019/12:43:05 PM    Final     Procedures Procedures (including critical care time)  Medications Ordered in ED Medications  albuterol (VENTOLIN HFA) 108 (90 Base) MCG/ACT inhaler 8 puff (8 puffs Inhalation Given 07/28/19 0457)    ED Course  I have reviewed the triage vital signs and the nursing notes.  Pertinent labs & imaging results that were available during my care of the patient were reviewed by me and considered in my medical decision making (see chart for details).    MDM Rules/Calculators/A&P                      Patient with history of COPD presents to the emergency department for evaluation of difficulty breathing.  Patient did have active wheezing upon arrival.  He was given albuterol prior to transport and reports that that did help significantly.  Patient  improved further with additional bronchodilator therapy.  He is not requiring any oxygen therapy at this time.  Chest x-ray does not show any significant change from previous.  Patient was just hospitalized 2 days ago with epigastric discomfort.  During that hospitalization he had cardiac evaluation.  Troponins were elevated persistently.  Cardiology saw the patient and did not feel that he required any further cardiac work-up.  Troponins today are still elevated but consistent with prior values, with the patient's wheezing and lack of chest pain, I do not feel that he requires repeat hospitalization.  Final Clinical Impression(s) / ED Diagnoses Final diagnoses:  COPD exacerbation [facility])    Rx / DC Orders ED Discharge Orders    None       Cosette Prindle, Gwenyth Allegra, MD 07/28/19 [phone]

## 2019-07-28 NOTE — Progress Notes (Signed)
CSW in contact with attending provider and reported that patient is in need of home 02. CSW later attempted to contact with Juliann Pulse with [facility] regarding 02 home needs and was unsuccessful. CSW left VM requesting call back regarding this matter.   CSW will continue to follow patient for discharge related needs  [facility]  Clinical Social Worker  Ph: [phone]

## 2019-07-28 NOTE — ED Notes (Signed)
Pt on room air. Pts oxygen dropped to 83%. Place on 2.5 L [place]. Oxygen at 93% at this time. MD notified

## 2019-07-28 NOTE — ED Notes (Signed)
Date and time results received: 07/28/19 0626  Test: troponin  Critical Value: 112  Name of Provider Notified: [place]  Orders Received? Or Actions Taken?: md notified

## 2019-07-28 NOTE — Progress Notes (Signed)
CSW received call back from [facility] regarding pt 02 needs. CSW provided [place] with referral information. Patient now being admitted under observation. CSW will continue to follow for discharge related needs  [place] [facility]  Clinical Social Worker  Ph: [phone]

## 2019-07-28 NOTE — H&P (Signed)
History and Physical    [name] PNT:[id number] DOB: [birth year] DOA: 07/28/2019  PCP: Patient, No Pcp Per   Patient coming from: Group home  Chief Complaint: Shortness of breath  HPI: [name] is a 54 y.o. male with medical history significant for COPD, hypertension, schizophrenia, dyslipidemia, depression, type 2 diabetes, and ongoing tobacco abuse who presented to the ED after recent discharge on 4/23 for atypical chest pain.  He now presents back to the ED with evaluation of shortness of breath overnight and was noted to have some wheezing.  He denies any chest pain, cough, fevers, chills, abdominal pain, lower extremity edema, or any other concerns.   ED Course: Stable vital signs noted and troponins elevated, but with him baseline elevation.  EKG with sinus rhythm at 93 bpm.  1 view chest x-ray with some right base atelectasis and questionable early infiltrate.  He was noted to have oxygen saturation of approximately 83% on room air and was placed on 2 L nasal cannula oxygen.  He does not typically wear oxygen at home, and does continue to smoke.  He did receive a breathing treatment with improvement in his wheezing and cough noted.  He denies any further shortness of breath.  Review of Systems: All others negative except as noted above.  Past Medical History:  Diagnosis Date  . Aortic insufficiency    mild to moderate   . Arteriosclerotic cardiovascular disease (ASCVD)    EF 60% -- Non-Q MI in 07/2008->  BMS to mid LAD  . Axillary abscess 2011   right  . Cerebrovascular disease    CVA  . Chronic obstructive pulmonary disease ([place])   . Diabetes mellitus type II   . Diabetes mellitus, type 2 (HCC)    A1c of 5.6 in 05/2011  . Hyperlipidemia   . Hypertension   . Mild anemia    Result.  Nl H&H in 02/2011  . Obesity   . Schizophrenia ([place])    Schizophrenia/bipolar disease/mental retardation  . Sinus bradycardia    On low dose beta blocker  . Tobacco abuse    15 pack years; 0.5 pack per day    Past Surgical History:  Procedure Laterality Date  . [place]     reports that he has been smoking cigarettes. He started smoking about 44 years ago. He has a 30.00 pack-year smoking history. He has never used smokeless tobacco. He reports that he does not drink alcohol or use drugs.  No Known Allergies  Family History  Problem Relation Age of Onset  . Hypertension Other   . Arthritis Other     Prior to Admission medications   Medication Sig Start Date End Date Taking? Authorizing Provider  acetaminophen (TYLENOL) 500 MG tablet Take 1 tablet (500 mg total) by mouth every 6 (six) hours as needed. 01/11/16   [name], PA-C  amLODipine (NORVASC) 10 MG tablet Take 1 tablet (10 mg total) by mouth daily. 07/26/19   Roxan Hockey, MD  aspirin EC 81 MG tablet Take 1 tablet (81 mg total) by mouth daily with breakfast. 07/26/19   Roxan Hockey, MD  carvedilol (COREG) 6.25 MG tablet Take 1 tablet (6.25 mg total) by mouth 2 (two) times daily. 07/26/19   Roxan Hockey, MD  cholecalciferol (VITAMIN D3) 25 MCG (1000 UNIT) tablet Take 1,000 Units by mouth daily.    [provider]  cloNIDine (CATAPRES) 0.1 MG tablet Take 1 tablet (0.1 mg total) by mouth 2 (two) times  daily. 07/26/19   Roxan Hockey, MD  diclofenac sodium (VOLTAREN) 1 % GEL Apply 2 g topically 4 (four) times daily.    [provider]  doxycycline (VIBRAMYCIN) 100 MG capsule Take 1 capsule (100 mg total) by mouth 2 (two) times daily. 07/28/19   Orpah Greek, MD  fish oil-omega-3 fatty acids 1000 MG capsule Take 1 g by mouth in the morning, at noon, and at bedtime.     [provider]  furosemide (LASIX) 40 MG tablet Take 1 tablet (40 mg total) by mouth daily. For Fluid 07/26/19   Roxan Hockey, MD  haloperidol decanoate (HALDOL DECANOATE) 100 MG/ML injection Inject 100 mg into the muscle every 14 (fourteen) days.      [provider]    hydrALAZINE (APRESOLINE) 100 MG tablet Take 1 tablet (100 mg total) by mouth 3 (three) times daily. 07/26/19   Roxan Hockey, MD  insulin glargine (LANTUS) 100 UNIT/ML injection Inject 20 Units into the skin See admin instructions. Twice daily For blood sugar levels over 100. Do not administer for blood sugar levels lower than 100    [provider]  isosorbide mononitrate (IMDUR) 30 MG 24 hr tablet Take 1 tablet (30 mg total) by mouth daily. 07/26/19 07/25/20  Roxan Hockey, MD  loperamide (QC ANTI-DIARRHEAL) 2 MG tablet Take 2 mg by mouth 4 (four) times daily as needed for diarrhea or loose stools.    [provider]  minoxidil (LONITEN) 10 MG tablet Take 1 tablet (10 mg total) by mouth daily. 07/26/19   Roxan Hockey, MD  Multiple Vitamin (DAILY VITE PO) Take 1 tablet by mouth every morning.     [provider]  NIFEdipine (ADALAT CC) 90 MG 24 hr tablet Take 1 tablet (90 mg total) by mouth daily. 07/26/19   Roxan Hockey, MD  nitroGLYCERIN (NITROSTAT) 0.4 MG SL tablet Place 0.4 mg under the tongue every 5 (five) minutes as needed for chest pain.    [provider]  omeprazole (PRILOSEC) 20 MG capsule Take 1 capsule (20 mg total) by mouth daily. 07/25/19   Henderly, Britni A, PA-C  potassium chloride (KLOR-CON) 10 MEQ tablet TAKE (1) TABLET BY MOUTH ONCE DAILY. 07/26/19   Roxan Hockey, MD  pravastatin (PRAVACHOL) 40 MG tablet Take 1 tablet (40 mg total) by mouth at bedtime. 07/26/19   Roxan Hockey, MD  sodium bicarbonate 650 MG tablet Take 650 mg by mouth 3 (three) times daily.    [provider]  sucralfate (CARAFATE) 1 GM/10ML suspension Take 10 mLs (1 g total) by mouth 4 (four) times daily -  with meals and at bedtime. 07/25/19   Henderly, Britni A, PA-C  traZODone (DESYREL) 50 MG tablet Take 50 mg by mouth at bedtime.    [provider]    Physical Exam: Vitals:   07/28/19 0810 07/28/19 0811 07/28/19 0902 07/28/19 0930  BP:    (!) 183/75 (!) 148/68  Pulse: 89 79 93 71  Resp: $Remo'16 15 16 17  'WLOtu$ Temp:      SpO2: (!) 85% (!) 87% 90% 99%  Weight:      Height:        Constitutional: NAD, calm, comfortable Vitals:   07/28/19 0810 07/28/19 0811 07/28/19 0902 07/28/19 0930  BP:   (!) 183/75 (!) 148/68  Pulse: 89 79 93 71  Resp: $Remo'16 15 16 17  'AJFgX$ Temp:      SpO2: (!) 85% (!) 87% 90% 99%  Weight:  Height:       Eyes: lids and conjunctivae normal ENMT: Mucous membranes are moist.  Neck: normal, supple Respiratory: clear to auscultation bilaterally. Normal respiratory effort. No accessory muscle use.  Requiring 2 L nasal cannula oxygen. Cardiovascular: Regular rate and rhythm, no murmurs. No extremity edema. Abdomen: no tenderness, no distention. Bowel sounds positive.  Musculoskeletal:  No joint deformity upper and lower extremities.   Skin: no rashes, lesions, ulcers.  Psychiatric: Normal judgment and insight. Alert and oriented x 3. Normal mood.   Labs on Admission: I have personally reviewed following labs and imaging studies  CBC: Recent Labs  Lab 07/25/19 1943 07/26/19 0449 07/28/19 0321  WBC 6.9 6.2 8.2  NEUTROABS 5.2  --  6.6  HGB 10.5* 10.8* 9.6*  HCT 33.9* 35.1* 31.6*  MCV 86.5 85.6 87.3  PLT 182 190 654   Basic Metabolic Panel: Recent Labs  Lab 07/25/19 1943 07/26/19 0449 07/28/19 0321  NA 139 141 141  K 4.2 4.1 4.2  CL 107 107 109  CO2 $Re'23 24 24  'LOw$ GLUCOSE 102* 106* 126*  BUN 40* 39* 37*  CREATININE 4.24* 4.20* 4.58*  CALCIUM 8.4* 9.0 8.4*   GFR: Estimated Creatinine Clearance: 22.1 mL/min (A) (by C-G formula based on SCr of 4.58 mg/dL (H)). Liver Function Tests: Recent Labs  Lab 07/25/19 1943  AST 23  ALT 19  ALKPHOS 105  BILITOT 0.6  PROT 6.7  ALBUMIN 3.1*   Recent Labs  Lab 07/25/19 1943  LIPASE 27   No results for input(s): AMMONIA in the last 168 hours. Coagulation Profile: No results for input(s): INR, PROTIME in the last 168 hours. Cardiac Enzymes: No results  for input(s): CKTOTAL, CKMB, CKMBINDEX, TROPONINI in the last 168 hours. BNP (last 3 results) No results for input(s): PROBNP in the last 8760 hours. HbA1C: Recent Labs    07/26/19 0449  HGBA1C 5.8*   CBG: No results for input(s): GLUCAP in the last 168 hours. Lipid Profile: No results for input(s): CHOL, HDL, LDLCALC, TRIG, CHOLHDL, LDLDIRECT in the last 72 hours. Thyroid Function Tests: No results for input(s): TSH, T4TOTAL, FREET4, T3FREE, THYROIDAB in the last 72 hours. Anemia Panel: No results for input(s): VITAMINB12, FOLATE, FERRITIN, TIBC, IRON, RETICCTPCT in the last 72 hours. Urine analysis:    Component Value Date/Time   COLORURINE STRAW (A) 07/25/2019 2157   APPEARANCEUR CLEAR 07/25/2019 2157   LABSPEC 1.003 (L) 07/25/2019 2157   PHURINE 6.0 07/25/2019 2157   GLUCOSEU NEGATIVE 07/25/2019 2157   HGBUR NEGATIVE 07/25/2019 2157   BILIRUBINUR NEGATIVE 07/25/2019 2157   KETONESUR NEGATIVE 07/25/2019 2157   PROTEINUR >=300 (A) 07/25/2019 2157   UROBILINOGEN 0.2 12/17/2012 1644   NITRITE NEGATIVE 07/25/2019 2157   LEUKOCYTESUR NEGATIVE 07/25/2019 2157    Radiological Exams on Admission: DG Chest Port 1 View  Result Date: 07/28/2019 CLINICAL DATA:  Shortness of breath. EXAM: PORTABLE CHEST 1 VIEW COMPARISON:  None. FINDINGS: Stable moderate severity increased interstitial lung markings are seen. Very mild right basilar atelectasis and/or early infiltrate is noted. There is no evidence of a pleural effusion or pneumothorax. The cardiac silhouette is mildly enlarged and unchanged in size. Multilevel degenerative changes seen throughout the thoracic spine. IMPRESSION: Stable moderate severity increased interstitial lung markings with very mild right basilar atelectasis and/or early infiltrate. Electronically Signed   By: Virgina Norfolk M.D.   On: 07/28/2019 03:53    EKG: Independently reviewed.  Sinus rhythm 93 bpm.  Assessment/Plan Active Problems:   Acute hypoxemic  respiratory failure (HCC)    Acute hypoxemic respiratory failure likely associated with mild COPD exacerbation -Maintain on incentive spirometry as well as scheduled breathing treatments -Pulmicort twice daily and avoid systemic steroids -Wean as tolerated and have up in chair -Plan to ambulate again in a.m. to reassess for oxygen needs -Recent Covid testing 4/23 negative  CKD stage IV -Currently stable, continue to monitor -We will follow up with his nephrologist Dr. Theador Hawthorne outpatient -Maintain on Lasix and sodium bicarbonate  Hypertension -Continue carvedilol, hydralazine, clonidine, minoxidil, Imdur, and nifedipine -Currently controlled  Type 2 diabetes -Continue home medications and place on SSI with carb modified diet -Recent hemoglobin A1c 5.8%  Schizophrenia -Continue Haldol at home every 2 weeks  Depression -Continue trazodone  GERD -PPI   DVT prophylaxis: Heparin Code Status: Full Family Communication: None at bedside Disposition Plan: Admit for treatment of COPD exacerbation and placement on oxygen prior to discharge Consults called: None Admission status: Observation, MedSurg   Areli Jowett D Manuella Ghazi DO [facility]  If 7PM-7AM, please contact night-coverage [email or web address]  07/28/2019, 11:21 AM

## 2019-07-29 DIAGNOSIS — J9601 Acute respiratory failure with hypoxia: Secondary | ICD-10-CM | POA: Diagnosis not present

## 2019-07-29 LAB — BASIC METABOLIC PANEL
Anion gap: 10 (ref 5–15)
BUN: 44 mg/dL — ABNORMAL HIGH (ref 6–20)
CO2: 23 mmol/L (ref 22–32)
Calcium: 9.1 mg/dL (ref 8.9–10.3)
Chloride: 107 mmol/L (ref 98–111)
Creatinine, Ser: 4.78 mg/dL — ABNORMAL HIGH (ref 0.61–1.24)
GFR calc Af Amer: 15 mL/min — ABNORMAL LOW (ref >60)
GFR calc non Af Amer: 13 mL/min — ABNORMAL LOW (ref >60)
Glucose, Bld: 110 mg/dL — ABNORMAL HIGH (ref 70–99)
Potassium: 5.1 mmol/L (ref 3.5–5.1)
Sodium: 140 mmol/L (ref 135–145)

## 2019-07-29 LAB — CBC
HCT: 30.5 % — ABNORMAL LOW (ref 39.0–52.0)
Hemoglobin: 9.3 g/dL — ABNORMAL LOW (ref 13.0–17.0)
MCH: 26.4 pg (ref 26.0–34.0)
MCHC: 30.5 g/dL (ref 30.0–36.0)
MCV: 86.6 fL (ref 80.0–100.0)
Platelets: 187 10*3/uL (ref 150–400)
RBC: 3.52 MIL/uL — ABNORMAL LOW (ref 4.22–5.81)
RDW: 15.1 % (ref 11.5–15.5)
WBC: 8.7 10*3/uL (ref 4.0–10.5)
nRBC: 0 % (ref 0.0–0.2)

## 2019-07-29 LAB — MAGNESIUM: Magnesium: 2.1 mg/dL (ref 1.7–2.4)

## 2019-07-29 LAB — GLUCOSE, CAPILLARY
Glucose-Capillary: 81 mg/dL (ref 70–99)
Glucose-Capillary: 92 mg/dL (ref 70–99)

## 2019-07-29 MED ORDER — ALBUTEROL SULFATE HFA 108 (90 BASE) MCG/ACT IN AERS: 2.0000 | INHALATION_SPRAY | Freq: Four times a day (QID) | RESPIRATORY_TRACT | 3 refills | Status: AC | PRN

## 2019-07-29 NOTE — TOC Transition Note (Signed)
Transition of Care [facility]) - CM/SW Discharge Note   Patient Details  Name: [name] MRN: [id number] Date of Birth: 21-Aug-1965  [facility] CM/SW Contact:  Boneta Lucks, RN Phone Number: 07/29/2019, 12:27 PM   Clinical Narrative:   Patient admitted with acute hypoxemic respiratory failure. Patient lives at [facility] care. Patient is requiring oxygen, Juliann Pulse with [employer] is delivering to the room. TOC called De Blanch at [facility] and PCP providers emailed to [facility]. RN to call [facility] with Report and pick up time after oxygen has been set up.     Barriers to Discharge: Barriers Resolved    Discharge Placement     [facility] care home     Discharge Plan and Services                DME Arranged: Oxygen DME Agency: [employer] Date DME Agency Contacted: 07/29/19 Time DME Agency Contacted: [phone] Representative spoke with at DME Agency: Blake Divine

## 2019-07-29 NOTE — Discharge Summary (Signed)
Physician Discharge Summary  [name] HAL:[id number] DOB: 1965/05/17 DOA: 07/28/2019  PCP: Patient, No Pcp Per  Admit date: 07/28/2019  Discharge date: 07/29/2019  Admitted From:Group home  Disposition:  Group home  Recommendations for Outpatient Follow-up:  1. Follow up with new PCP in 1-2 weeks and reevaluate blood pressure readings 2. Resume on home blood pressure medications as noted below with amlodipine and minoxidil currently withheld.  May resume if blood pressures begin to elevate in the outpatient setting. 3. Continue on home oxygen as prescribed with albuterol inhaler as needed for shortness of breath or wheezing  Home Health: None  Equipment/Devices: Home 2 L nasal cannula oxygen  Discharge Condition: Stable  CODE STATUS: Full  Diet recommendation: Heart Healthy/carb modified  Brief/Interim Summary: Per HPI: [name] is a 54 y.o. male with medical history significant for COPD, hypertension, schizophrenia, dyslipidemia, depression, type 2 diabetes, and ongoing tobacco abuse who presented to the ED after recent discharge on 4/23 for atypical chest pain.  He now presents back to the ED with evaluation of shortness of breath overnight and was noted to have some wheezing.  He denies any chest pain, cough, fevers, chills, abdominal pain, lower extremity edema, or any other concerns.  Patient was admitted with complaints of shortness of breath and noted hypoxemia on room air.  He was admitted with mild COPD exacerbation and treated overnight with breathing treatments.  He had remained on 2 L nasal cannula oxygen without any significant complaints or concerns throughout the course of this hospitalization.  He is noted to require 2 L oxygen on discharge and this will be arranged for him.  Blood pressures have remained controlled on his current regimen without the use of amlodipine or minoxidil.  Will resume Imdur in addition to the rest of his medications in  anticipation that his blood pressures would likely elevate in the outpatient setting.  He will need close follow-up with a new PCP on discharge to monitor blood pressure readings while on these medications.  Discharge Diagnoses:  Active Problems:   Acute hypoxemic respiratory failure (HCC)  Principal discharge diagnosis: Acute hypoxemic respiratory failure secondary to mild COPD exacerbation.  Discharge Instructions  Discharge Instructions    Diet - low sodium heart healthy   Complete by: As directed    Increase activity slowly   Complete by: As directed      Allergies as of 07/29/2019   No Known Allergies     Medication List    STOP taking these medications   amLODipine 10 MG tablet Commonly known as: NORVASC   minoxidil 10 MG tablet Commonly known as: LONITEN     TAKE these medications   acetaminophen 500 MG tablet Commonly known as: TYLENOL Take 1 tablet (500 mg total) by mouth every 6 (six) hours as needed.   albuterol 108 (90 Base) MCG/ACT inhaler Commonly known as: VENTOLIN HFA Inhale 2 puffs into the lungs every 6 (six) hours as needed for wheezing or shortness of breath.   aspirin EC 81 MG tablet Take 1 tablet (81 mg total) by mouth daily with breakfast.   carvedilol 6.25 MG tablet Commonly known as: COREG Take 1 tablet (6.25 mg total) by mouth 2 (two) times daily.   cholecalciferol 25 MCG (1000 UNIT) tablet Commonly known as: VITAMIN D3 Take 1,000 Units by mouth daily.   cloNIDine 0.1 MG tablet Commonly known as: CATAPRES Take 1 tablet (0.1 mg total) by mouth 2 (two) times daily.   DAILY VITE PO  Take 1 tablet by mouth every morning.   diclofenac sodium 1 % Gel Commonly known as: VOLTAREN Apply 2 g topically 4 (four) times daily.   fish oil-omega-3 fatty acids 1000 MG capsule Take 1 g by mouth in the morning, at noon, and at bedtime.   furosemide 40 MG tablet Commonly known as: LASIX Take 1 tablet (40 mg total) by mouth daily. For Fluid What  changed: additional instructions   haloperidol decanoate 100 MG/ML injection Commonly known as: HALDOL DECANOATE Inject 100 mg into the muscle every 14 (fourteen) days.   hydrALAZINE 100 MG tablet Commonly known as: APRESOLINE Take 1 tablet (100 mg total) by mouth 3 (three) times daily.   insulin glargine 100 UNIT/ML injection Commonly known as: LANTUS Inject 20 Units into the skin See admin instructions. Twice daily For blood sugar levels over 100. Do not administer for blood sugar levels lower than 100   isosorbide mononitrate 30 MG 24 hr tablet Commonly known as: IMDUR Take 1 tablet (30 mg total) by mouth daily.   NIFEdipine 90 MG 24 hr tablet Commonly known as: ADALAT CC Take 1 tablet (90 mg total) by mouth daily.   nitroGLYCERIN 0.4 MG SL tablet Commonly known as: NITROSTAT Place 0.4 mg under the tongue every 5 (five) minutes as needed for chest pain.   omeprazole 20 MG capsule Commonly known as: PRILOSEC Take 1 capsule (20 mg total) by mouth daily.   potassium chloride 10 MEQ tablet Commonly known as: KLOR-CON TAKE (1) TABLET BY MOUTH ONCE DAILY. What changed:   how much to take  how to take this  when to take this  additional instructions   pravastatin 40 MG tablet Commonly known as: PRAVACHOL Take 1 tablet (40 mg total) by mouth at bedtime.   QC Anti-Diarrheal 2 MG tablet Generic drug: loperamide Take 2 mg by mouth 4 (four) times daily as needed for diarrhea or loose stools.   sodium bicarbonate 650 MG tablet Take 650 mg by mouth 3 (three) times daily.   sucralfate 1 GM/10ML suspension Commonly known as: Carafate Take 10 mLs (1 g total) by mouth 4 (four) times daily -  with meals and at bedtime. What changed: when to take this   traZODone 50 MG tablet Commonly known as: DESYREL Take 50 mg by mouth at bedtime.            Durable Medical Equipment  (From admission, onward)         Start     Ordered   07/29/19 1141  DME Oxygen  Once     Question Answer Comment  Length of Need Lifetime   Mode or (Route) Nasal cannula   Liters per Minute 2   Frequency Continuous (stationary and portable oxygen unit needed)   Oxygen conserving device Yes   Oxygen delivery system Gas      07/29/19 1149         Follow-up Information    pcp Follow up in 1 week(s).          No Known Allergies  Consultations:  None   Procedures/Studies: DG Chest Port 1 View  Result Date: 07/28/2019 CLINICAL DATA:  Shortness of breath. EXAM: PORTABLE CHEST 1 VIEW COMPARISON:  None. FINDINGS: Stable moderate severity increased interstitial lung markings are seen. Very mild right basilar atelectasis and/or early infiltrate is noted. There is no evidence of a pleural effusion or pneumothorax. The cardiac silhouette is mildly enlarged and unchanged in size. Multilevel degenerative changes seen throughout the  thoracic spine. IMPRESSION: Stable moderate severity increased interstitial lung markings with very mild right basilar atelectasis and/or early infiltrate. Electronically Signed   By: Virgina Norfolk M.D.   On: 07/28/2019 03:53   DG Chest Portable 1 View  Result Date: 07/25/2019 CLINICAL DATA:  Elevated troponin EXAM: PORTABLE CHEST 1 VIEW COMPARISON:  12/26/2017 FINDINGS: Cardiomegaly.No overt edema or effusions. No acute bony abnormality. IMPRESSION: Cardiomegaly.  No active disease. Electronically Signed   By: Rolm Baptise M.D.   On: 07/25/2019 22:16   ECHOCARDIOGRAM COMPLETE  Result Date: 07/26/2019    ECHOCARDIOGRAM REPORT   Patient Name:   [name] Date of Exam: 07/26/2019 Medical Rec #:  [id number]          Height:       68.5 in Accession #:    [id number]         Weight:       241.8 lb Date of Birth:  1966-03-30          BSA:          2.228 m Patient Age:    10 years           BP:           176/58 mmHg Patient Gender: M                  HR:           84 bpm. Exam Location:  [facility] Procedure: 2D Echo Indications:    CAD Native  Vessel 414.01 / I25.10  History:        Patient has prior history of Echocardiogram examinations, most                 recent 05/21/2019. CAD; Risk Factors:Hypertension, Diabetes,                 Dyslipidemia and Current Smoker. Schizophrenia, GERD, Elevated                 Troponin, Acute renal failure.  Sonographer:    Leavy Cella RDCS (AE) Referring Phys: [place]  1. Left ventricular ejection fraction, by estimation, is 60 to 65%. The left ventricle has normal function. The left ventricle has no regional wall motion abnormalities. There is severe left ventricular hypertrophy. Left ventricular diastolic parameters  are indeterminate.  2. Right ventricular systolic function is normal. The right ventricular size is normal.  3. The mitral valve is abnormal. No evidence of mitral valve regurgitation. No evidence of mitral stenosis.  4. The aortic valve has an indeterminant number of cusps. Aortic valve regurgitation is moderate. Mild to moderate aortic valve stenosis.  5. The inferior vena cava is normal in size with greater than 50% respiratory variability, suggesting right atrial pressure of 3 mmHg. FINDINGS  Left Ventricle: Left ventricular ejection fraction, by estimation, is 60 to 65%. The left ventricle has normal function. The left ventricle has no regional wall motion abnormalities. The left ventricular internal cavity size was normal in size. There is  severe left ventricular hypertrophy. Left ventricular diastolic parameters are indeterminate. Right Ventricle: The right ventricular size is normal. No increase in right ventricular wall thickness. Right ventricular systolic function is normal. Left Atrium: Left atrial size was normal in size. Right Atrium: Right atrial size was normal in size. Pericardium: A small pericardial effusion is present. The pericardial effusion is circumferential. Mitral Valve: The mitral valve is abnormal. There is moderate thickening of the mitral  valve leaflet(s). There is moderate calcification of the mitral valve leaflet(s). Moderate mitral annular calcification. No evidence of mitral valve regurgitation. No evidence of mitral valve stenosis. Tricuspid Valve: The tricuspid valve is normal in structure. Tricuspid valve regurgitation is not demonstrated. No evidence of tricuspid stenosis. Aortic Valve: The aortic valve has an indeterminant number of cusps. . There is moderate thickening and moderate calcification of the aortic valve. Aortic valve regurgitation is moderate. Aortic regurgitation PHT measures 419 msec. Mild to moderate aortic stenosis is present. Moderate aortic valve annular calcification. There is moderate thickening of the aortic valve. There is moderate calcification of the aortic valve. Aortic valve mean gradient measures 16.7 mmHg. Aortic valve peak gradient measures 33.2 mmHg. Aortic valve area, by VTI measures 1.39 cm. Pulmonic Valve: The pulmonic valve was not well visualized. Pulmonic valve regurgitation is mild. No evidence of pulmonic stenosis. Aorta: The aortic root is normal in size and structure. Pulmonary Artery: Indeterminant PASP, inadequate TR jet. Venous: The inferior vena cava is normal in size with greater than 50% respiratory variability, suggesting right atrial pressure of 3 mmHg. IAS/Shunts: The interatrial septum was not well visualized.  LEFT VENTRICLE PLAX 2D LVIDd:         5.53 cm LVIDs:         2.89 cm LV PW:         1.69 cm LV IVS:        1.62 cm LVOT diam:     1.90 cm LV SV:         76 LV SV Index:   34 LVOT Area:     2.84 cm  RIGHT VENTRICLE RV S prime:     20.80 cm/s TAPSE (M-mode): 2.9 cm LEFT ATRIUM             Index LA diam:        3.80 cm 1.71 cm/m LA Vol (A2C):   73.0 ml 32.77 ml/m LA Vol (A4C):   37.1 ml 16.65 ml/m LA Biplane Vol: 54.8 ml 24.60 ml/m  AORTIC VALVE AV Area (Vmax):    1.29 cm AV Area (Vmean):   1.40 cm AV Area (VTI):     1.39 cm AV Vmax:           288.19 cm/s AV Vmean:           188.160 cm/s AV VTI:            0.546 m AV Peak Grad:      33.2 mmHg AV Mean Grad:      16.7 mmHg LVOT Vmax:         131.40 cm/s LVOT Vmean:        93.200 cm/s LVOT VTI:          0.267 m LVOT/AV VTI ratio: 0.49 AI PHT:            419 msec  AORTA Ao Root diam: 3.10 cm MITRAL VALVE MV Area (PHT): 2.90 cm     SHUNTS MV Decel Time: 262 msec     Systemic VTI:  0.27 m MV E velocity: 109.00 cm/s  Systemic Diam: 1.90 cm MV A velocity: 92.40 cm/s MV E/A ratio:  1.18 Dina Rich MD Electronically signed by Dina Rich MD Signature Date/Time: 07/26/2019/12:43:05 PM    Final      Discharge Exam: Vitals:   07/29/19 0723 07/29/19 0928  BP:    Pulse:    Resp:    Temp:    SpO2: 98% 98%  Vitals:   07/29/19 0207 07/29/19 0635 07/29/19 0723 07/29/19 0928  BP: (!) 149/59 (!) 155/73    Pulse: 68 75    Resp: 20 18    Temp: 98.5 F (36.9 C) 98.2 F (36.8 C)    TempSrc:      SpO2: 95% 100% 98% 98%  Weight:      Height:        General: Pt is alert, awake, not in acute distress Cardiovascular: RRR, S1/S2 +, no rubs, no gallops Respiratory: CTA bilaterally, no wheezing, no rhonchi, 2 L nasal cannula oxygen Abdominal: Soft, NT, ND, bowel sounds + Extremities: no edema, no cyanosis    The results of significant diagnostics from this hospitalization (including imaging, microbiology, ancillary and laboratory) are listed below for reference.     Microbiology: Recent Results (from the past 240 hour(s))  Respiratory Panel by RT PCR (Flu A&B, Covid) - Nasopharyngeal Swab     Status: None   Collection Time: 07/26/19  1:36 AM   Specimen: Nasopharyngeal Swab  Result Value Ref Range Status   SARS Coronavirus 2 by RT PCR NEGATIVE NEGATIVE Final    Comment: (NOTE) SARS-CoV-2 target nucleic acids are NOT DETECTED. The SARS-CoV-2 RNA is generally detectable in upper respiratoy specimens during the acute phase of infection. The lowest concentration of SARS-CoV-2 viral copies this assay can detect  is 131 copies/mL. A negative result does not preclude SARS-Cov-2 infection and should not be used as the sole basis for treatment or other patient management decisions. A negative result may occur with  improper specimen collection/handling, submission of specimen other than nasopharyngeal swab, presence of viral mutation(s) within the areas targeted by this assay, and inadequate number of viral copies (<131 copies/mL). A negative result must be combined with clinical observations, patient history, and epidemiological information. The expected result is Negative. Fact Sheet for Patients:  PinkCheek.be Fact Sheet for Healthcare Providers:  GravelBags.it This test is not yet ap proved or cleared by the [place] FDA and  has been authorized for detection and/or diagnosis of SARS-CoV-2 by FDA under an Emergency Use Authorization (EUA). This EUA will remain  in effect (meaning this test can be used) for the duration of the COVID-19 declaration under Section 564(b)(1) of the Act, 21 U.S.C. section 360bbb-3(b)(1), unless the authorization is terminated or revoked sooner.    Influenza A by PCR NEGATIVE NEGATIVE Final   Influenza B by PCR NEGATIVE NEGATIVE Final    Comment: (NOTE) The Xpert Xpress SARS-CoV-2/FLU/RSV assay is intended as an aid in  the diagnosis of influenza from Nasopharyngeal swab specimens and  should not be used as a sole basis for treatment. Nasal washings and  aspirates are unacceptable for Xpert Xpress SARS-CoV-2/FLU/RSV  testing. Fact Sheet for Patients: PinkCheek.be Fact Sheet for Healthcare Providers: GravelBags.it This test is not yet approved or cleared by the [place] FDA and  has been authorized for detection and/or diagnosis of SARS-CoV-2 by  FDA under an Emergency Use Authorization (EUA). This EUA will remain  in effect (meaning this  test can be used) for the duration of the  Covid-19 declaration under Section 564(b)(1) of the Act, 21  U.S.C. section 360bbb-3(b)(1), unless the authorization is  terminated or revoked. Performed at [facility], [address] [place], [place] [address]      Labs: BNP (last 3 results) Recent Labs    07/28/19 0321  BNP 77.8   Basic Metabolic Panel: Recent Labs  Lab 07/25/19 1943 07/26/19 0449 07/28/19 0321 07/29/19  0413  NA 139 141 141 140  K 4.2 4.1 4.2 5.1  CL 107 107 109 107  CO2 $Re'23 24 24 23  'Www$ GLUCOSE 102* 106* 126* 110*  BUN 40* 39* 37* 44*  CREATININE 4.24* 4.20* 4.58* 4.78*  CALCIUM 8.4* 9.0 8.4* 9.1  MG  --   --   --  2.1   Liver Function Tests: Recent Labs  Lab 07/25/19 1943  AST 23  ALT 19  ALKPHOS 105  BILITOT 0.6  PROT 6.7  ALBUMIN 3.1*   Recent Labs  Lab 07/25/19 1943  LIPASE 27   No results for input(s): AMMONIA in the last 168 hours. CBC: Recent Labs  Lab 07/25/19 1943 07/26/19 0449 07/28/19 0321 07/29/19 0413  WBC 6.9 6.2 8.2 8.7  NEUTROABS 5.2  --  6.6  --   HGB 10.5* 10.8* 9.6* 9.3*  HCT 33.9* 35.1* 31.6* 30.5*  MCV 86.5 85.6 87.3 86.6  PLT 182 190 192 187   Cardiac Enzymes: No results for input(s): CKTOTAL, CKMB, CKMBINDEX, TROPONINI in the last 168 hours. BNP: Invalid input(s): POCBNP CBG: Recent Labs  Lab 07/28/19 1150 07/28/19 1620 07/28/19 2203 07/29/19 0756 07/29/19 1123  GLUCAP 152* 209* 139* 81 92   D-Dimer No results for input(s): DDIMER in the last 72 hours. Hgb A1c No results for input(s): HGBA1C in the last 72 hours. Lipid Profile No results for input(s): CHOL, HDL, LDLCALC, TRIG, CHOLHDL, LDLDIRECT in the last 72 hours. Thyroid function studies No results for input(s): TSH, T4TOTAL, T3FREE, THYROIDAB in the last 72 hours.  Invalid input(s): FREET3 Anemia work up No results for input(s): VITAMINB12, FOLATE, FERRITIN, TIBC, IRON, RETICCTPCT in the last 72 hours. Urinalysis    Component Value  Date/Time   COLORURINE STRAW (A) 07/25/2019 2157   APPEARANCEUR CLEAR 07/25/2019 2157   LABSPEC 1.003 (L) 07/25/2019 2157   PHURINE 6.0 07/25/2019 2157   GLUCOSEU NEGATIVE 07/25/2019 2157   HGBUR NEGATIVE 07/25/2019 2157   [place] NEGATIVE 07/25/2019 2157   KETONESUR NEGATIVE 07/25/2019 2157   PROTEINUR >=300 (A) 07/25/2019 2157   UROBILINOGEN 0.2 12/17/2012 1644   NITRITE NEGATIVE 07/25/2019 2157   LEUKOCYTESUR NEGATIVE 07/25/2019 2157   Sepsis Labs Invalid input(s): PROCALCITONIN,  WBC,  LACTICIDVEN Microbiology Recent Results (from the past 240 hour(s))  Respiratory Panel by RT PCR (Flu A&B, Covid) - Nasopharyngeal Swab     Status: None   Collection Time: 07/26/19  1:36 AM   Specimen: Nasopharyngeal Swab  Result Value Ref Range Status   SARS Coronavirus 2 by RT PCR NEGATIVE NEGATIVE Final    Comment: (NOTE) SARS-CoV-2 target nucleic acids are NOT DETECTED. The SARS-CoV-2 RNA is generally detectable in upper respiratoy specimens during the acute phase of infection. The lowest concentration of SARS-CoV-2 viral copies this assay can detect is 131 copies/mL. A negative result does not preclude SARS-Cov-2 infection and should not be used as the sole basis for treatment or other patient management decisions. A negative result may occur with  improper specimen collection/handling, submission of specimen other than nasopharyngeal swab, presence of viral mutation(s) within the areas targeted by this assay, and inadequate number of viral copies (<131 copies/mL). A negative result must be combined with clinical observations, patient history, and epidemiological information. The expected result is Negative. Fact Sheet for Patients:  PinkCheek.be Fact Sheet for Healthcare Providers:  GravelBags.it This test is not yet ap proved or cleared by the [place] FDA and  has been authorized for detection and/or diagnosis of  SARS-CoV-2 by  FDA under an Emergency Use Authorization (EUA). This EUA will remain  in effect (meaning this test can be used) for the duration of the COVID-19 declaration under Section 564(b)(1) of the Act, 21 U.S.C. section 360bbb-3(b)(1), unless the authorization is terminated or revoked sooner.    Influenza A by PCR NEGATIVE NEGATIVE Final   Influenza B by PCR NEGATIVE NEGATIVE Final    Comment: (NOTE) The Xpert Xpress SARS-CoV-2/FLU/RSV assay is intended as an aid in  the diagnosis of influenza from Nasopharyngeal swab specimens and  should not be used as a sole basis for treatment. Nasal washings and  aspirates are unacceptable for Xpert Xpress SARS-CoV-2/FLU/RSV  testing. Fact Sheet for Patients: PinkCheek.be Fact Sheet for Healthcare Providers: GravelBags.it This test is not yet approved or cleared by the [place] FDA and  has been authorized for detection and/or diagnosis of SARS-CoV-2 by  FDA under an Emergency Use Authorization (EUA). This EUA will remain  in effect (meaning this test can be used) for the duration of the  Covid-19 declaration under Section 564(b)(1) of the Act, 21  U.S.C. section 360bbb-3(b)(1), unless the authorization is  terminated or revoked. Performed at [facility], [address] [place], [place] [address]      Time coordinating discharge: 35 minutes  SIGNED:   Rodena Goldmann, DO [facility] 07/29/2019, 11:50 AM  If 7PM-7AM, please contact night-coverage [email or web address]

## 2019-07-29 NOTE — Progress Notes (Signed)
SATURATION QUALIFICATIONS: (This note is used to comply with regulatory documentation for home oxygen)  Patient Saturations on Room Air at Rest = 96%  Patient Saturations on Room Air while Ambulating = 88%  Patient Saturations on 2 Liters of oxygen while Ambulating = 96%  Please briefly explain why patient needs home oxygen:  Patient desaturates to 88% on RA while ambulating.

## 2019-07-29 NOTE — NC FL2 (Signed)
[place] MEDICAID FL2 LEVEL OF CARE SCREENING TOOL     IDENTIFICATION  Patient Name: [name] Birthdate: 02-26-1966 Sex: male Admission Date (Current Location): 07/28/2019  [facility] and [place] Number:  [employer] and Address:  [place] [address] [place]      Provider Number: [id number]  Attending Physician Name and Address:  Rodena Goldmann, DO  Relative Name and Phone Number:  De Blanch  [phone]    Current Level of Care: Hospital Recommended Level of Care: [place] Prior Approval Number:    Date Approved/Denied:   PASRR Number:    Discharge Plan: Domiciliary (Rest home)    Current Diagnoses: Patient Active Problem List   Diagnosis Date Noted  . Acute hypoxemic respiratory failure ([place]) 07/28/2019  . Elevated troponin 07/26/2019  . Vomiting 10/02/2018  . Acute on chronic renal failure ([place]) 02/06/2018  . Schizoaffective disorder, bipolar type ([place]) 01/04/2016  . Acute renal failure ([place]) 12/17/2012  . Dehydration 12/17/2012  . Diarrhea 12/17/2012  . Gastroenteritis 12/17/2012  . GERD (gastroesophageal reflux disease) 12/17/2012  . Metabolic acidosis 43/32/9518  . Chronic kidney disease, stage 2, mildly decreased GFR 03/19/2012  . CAD (coronary artery disease)   . Schizophrenia ([place])   . Tobacco abuse   . Diabetes mellitus, type 2 ([place])   . Hypertension   . Hyperlipidemia   . Aortic insufficiency     Orientation RESPIRATION BLADDER Height & Weight     Self, Time, Situation, Place  O2 Continent Weight: 110.5 kg Height:  6' (182.9 cm)  BEHAVIORAL SYMPTOMS/MOOD NEUROLOGICAL BOWEL NUTRITION STATUS      Continent Diet(low sodium heart healthy)  AMBULATORY STATUS COMMUNICATION OF NEEDS Skin     Verbally Normal                       Personal Care Assistance Level of Assistance  Bathing, Feeding, Dressing Bathing Assistance: Limited assistance Feeding assistance:  Independent Dressing Assistance: Limited assistance     Functional Limitations Info  Sight, Hearing, Speech Sight Info: Adequate Hearing Info: Adequate Speech Info: Adequate    SPECIAL CARE FACTORS FREQUENCY                       Contractures Contractures Info: Not present    Additional Factors Info  Code Status, Allergies Code Status Info: Full Allergies Info: NKDA           Current Medications (07/29/2019):  This is the current hospital active medication list Current Facility-Administered Medications  Medication Dose Route Frequency Provider Last Rate Last Admin  . 0.9 %  sodium chloride infusion  250 mL Intravenous PRN Manuella Ghazi, Pratik D, DO      . acetaminophen (TYLENOL) tablet 650 mg  650 mg Oral Q6H PRN Manuella Ghazi, Pratik D, DO       Or  . acetaminophen (TYLENOL) suppository 650 mg  650 mg Rectal Q6H PRN Manuella Ghazi, Pratik D, DO      . albuterol (PROVENTIL) (2.5 MG/3ML) 0.083% nebulizer solution 2.5 mg  2.5 mg Nebulization Q4H PRN Manuella Ghazi, Pratik D, DO      . aspirin EC tablet 81 mg  81 mg Oral Q breakfast Manuella Ghazi, Pratik D, DO   81 mg at 07/29/19 1011  . budesonide (PULMICORT) nebulizer solution 0.25 mg  0.25 mg Nebulization BID Manuella Ghazi, Pratik D, DO   0.25 mg at 07/29/19 0723  . carvedilol (COREG) tablet 6.25 mg  6.25 mg Oral BID Heath Lark D, DO   6.25 mg at 07/29/19 1008  . cloNIDine (CATAPRES) tablet 0.1 mg  0.1 mg Oral BID Manuella Ghazi, Pratik D, DO   0.1 mg at 07/29/19 1006  . diclofenac sodium (VOLTAREN) 1 % transdermal gel 2 g  2 g Topical QID Manuella Ghazi, Pratik D, DO   2 g at 07/29/19 1012  . furosemide (LASIX) tablet 40 mg  40 mg Oral Daily Manuella Ghazi, Pratik D, DO   40 mg at 07/29/19 1006  . heparin injection 5,000 Units  5,000 Units Subcutaneous Q8H Shah, Pratik D, DO   5,000 Units at 07/29/19 0640  . hydrALAZINE (APRESOLINE) tablet 100 mg  100 mg Oral TID Manuella Ghazi, Pratik D, DO   100 mg at 07/29/19 1008  . insulin aspart (novoLOG) injection 0-5 Units  0-5 Units Subcutaneous QHS Manuella Ghazi, Pratik D, DO       . insulin aspart (novoLOG) injection 0-9 Units  0-9 Units Subcutaneous TID WC Shah, Pratik D, DO   3 Units at 07/28/19 1628  . insulin glargine (LANTUS) injection 20 Units  20 Units Subcutaneous BID Heath Lark D, DO   20 Units at 07/29/19 1007  . ipratropium-albuterol (DUONEB) 0.5-2.5 (3) MG/3ML nebulizer solution 3 mL  3 mL Nebulization Q6H Shah, Pratik D, DO   3 mL at 07/29/19 0725  . NIFEdipine (PROCARDIA-XL/NIFEDICAL-XL) 24 hr tablet 90 mg  90 mg Oral Daily Manuella Ghazi, Pratik D, DO   90 mg at 07/29/19 1007  . nitroGLYCERIN (NITROSTAT) SL tablet 0.4 mg  0.4 mg Sublingual Q5 min PRN Manuella Ghazi, Pratik D, DO      . ondansetron (ZOFRAN) tablet 4 mg  4 mg Oral Q6H PRN Manuella Ghazi, Pratik D, DO       Or  . ondansetron (ZOFRAN) injection 4 mg  4 mg Intravenous Q6H PRN Manuella Ghazi, Pratik D, DO      . pantoprazole (PROTONIX) EC tablet 40 mg  40 mg Oral Daily Manuella Ghazi, Pratik D, DO   40 mg at 07/29/19 1007  . pravastatin (PRAVACHOL) tablet 40 mg  40 mg Oral QHS Shah, Pratik D, DO   40 mg at 07/28/19 2245  . sodium bicarbonate tablet 650 mg  650 mg Oral TID Heath Lark D, DO   650 mg at 07/29/19 1007  . sodium chloride flush (NS) 0.9 % injection 3 mL  3 mL Intravenous Q12H Shah, Pratik D, DO   3 mL at 07/29/19 1009  . sodium chloride flush (NS) 0.9 % injection 3 mL  3 mL Intravenous PRN Manuella Ghazi, Pratik D, DO      . sucralfate (CARAFATE) 1 GM/10ML suspension 1 g  1 g Oral TID WC & HS Shah, Pratik D, DO   1 g at 07/29/19 1009  . traZODone (DESYREL) tablet 50 mg  50 mg Oral QHS Shah, Pratik D, DO   50 mg at 07/28/19 2246  . Vitamin D3 (Vitamin D) tablet 1,000 Units  1,000 Units Oral Daily Heath Lark D, DO   1,000 Units at 07/29/19 1008     Discharge Medications: Discharge Instructions      Discharge Instructions    Diet - low sodium heart healthy   Complete by: As directed    Increase activity slowly   Complete by: As directed      Allergies as of 07/29/2019   No Known Allergies              Medication  List  STOP taking these medications   amLODipine 10 MG tablet Commonly known as: NORVASC   minoxidil 10 MG tablet Commonly known as: LONITEN             TAKE these medications       acetaminophen 500 MG tablet Commonly known as: TYLENOL Take 1 tablet (500 mg total) by mouth every 6 (six) hours as needed.   albuterol 108 (90 Base) MCG/ACT inhaler Commonly known as: VENTOLIN HFA Inhale 2 puffs into the lungs every 6 (six) hours as needed for wheezing or shortness of breath.   aspirin EC 81 MG tablet Take 1 tablet (81 mg total) by mouth daily with breakfast.   carvedilol 6.25 MG tablet Commonly known as: COREG Take 1 tablet (6.25 mg total) by mouth 2 (two) times daily.   cholecalciferol 25 MCG (1000 UNIT) tablet Commonly known as: VITAMIN D3 Take 1,000 Units by mouth daily.   cloNIDine 0.1 MG tablet Commonly known as: CATAPRES Take 1 tablet (0.1 mg total) by mouth 2 (two) times daily.   DAILY VITE PO Take 1 tablet by mouth every morning.   diclofenac sodium 1 % Gel Commonly known as: VOLTAREN Apply 2 g topically 4 (four) times daily.   fish oil-omega-3 fatty acids 1000 MG capsule Take 1 g by mouth in the morning, at noon, and at bedtime.   furosemide 40 MG tablet Commonly known as: LASIX Take 1 tablet (40 mg total) by mouth daily. For Fluid What changed: additional instructions   haloperidol decanoate 100 MG/ML injection Commonly known as: HALDOL DECANOATE Inject 100 mg into the muscle every 14 (fourteen) days.   hydrALAZINE 100 MG tablet Commonly known as: APRESOLINE Take 1 tablet (100 mg total) by mouth 3 (three) times daily.   insulin glargine 100 UNIT/ML injection Commonly known as: LANTUS Inject 20 Units into the skin See admin instructions. Twice daily For blood sugar levels over 100. Do not administer for blood sugar levels lower than 100   isosorbide mononitrate 30 MG 24 hr tablet Commonly known as: IMDUR Take 1 tablet (30  mg total) by mouth daily.   NIFEdipine 90 MG 24 hr tablet Commonly known as: ADALAT CC Take 1 tablet (90 mg total) by mouth daily.   nitroGLYCERIN 0.4 MG SL tablet Commonly known as: NITROSTAT Place 0.4 mg under the tongue every 5 (five) minutes as needed for chest pain.   omeprazole 20 MG capsule Commonly known as: PRILOSEC Take 1 capsule (20 mg total) by mouth daily.   potassium chloride 10 MEQ tablet Commonly known as: KLOR-CON TAKE (1) TABLET BY MOUTH ONCE DAILY. What changed:   how much to take  how to take this  when to take this  additional instructions   pravastatin 40 MG tablet Commonly known as: PRAVACHOL Take 1 tablet (40 mg total) by mouth at bedtime.   QC Anti-Diarrheal 2 MG tablet Generic drug: loperamide Take 2 mg by mouth 4 (four) times daily as needed for diarrhea or loose stools.   sodium bicarbonate 650 MG tablet Take 650 mg by mouth 3 (three) times daily.   sucralfate 1 GM/10ML suspension Commonly known as: Carafate Take 10 mLs (1 g total) by mouth 4 (four) times daily -  with meals and at bedtime. What changed: when to take this   traZODone 50 MG tablet Commonly known as: DESYREL Take 50 mg by mouth at bedtime.                  Relevant  Imaging Results:  Relevant Lab Results:   Additional Information SS# [id number]  Boneta Lucks, RN

## 2019-07-29 NOTE — Plan of Care (Signed)

## 2019-07-31 ENCOUNTER — Ambulatory Visit: Payer: Medicaid Other | Admitting: Family Medicine

## 2019-08-23 ENCOUNTER — Emergency Department (HOSPITAL_COMMUNITY): Payer: Medicaid Other

## 2019-08-23 ENCOUNTER — Encounter (HOSPITAL_COMMUNITY): Payer: Self-pay | Admitting: *Deleted

## 2019-08-23 ENCOUNTER — Other Ambulatory Visit: Payer: Self-pay

## 2019-08-23 ENCOUNTER — Emergency Department (HOSPITAL_COMMUNITY)
Admission: EM | Admit: 2019-08-23 | Discharge: 2019-08-23 | Disposition: A | Discharge status: Home / Self Care | Payer: Medicaid Other | Attending: Emergency Medicine | Admitting: Emergency Medicine

## 2019-08-23 DIAGNOSIS — B070 Plantar wart: Secondary | ICD-10-CM | POA: Diagnosis not present

## 2019-08-23 DIAGNOSIS — E119 Type 2 diabetes mellitus without complications: Secondary | ICD-10-CM | POA: Diagnosis not present

## 2019-08-23 DIAGNOSIS — Z79899 Other long term (current) drug therapy: Secondary | ICD-10-CM | POA: Diagnosis not present

## 2019-08-23 DIAGNOSIS — I10 Essential (primary) hypertension: Secondary | ICD-10-CM | POA: Diagnosis not present

## 2019-08-23 DIAGNOSIS — M79671 Pain in right foot: Secondary | ICD-10-CM | POA: Diagnosis present

## 2019-08-23 DIAGNOSIS — Z794 Long term (current) use of insulin: Secondary | ICD-10-CM | POA: Insufficient documentation

## 2019-08-23 DIAGNOSIS — F17210 Nicotine dependence, cigarettes, uncomplicated: Secondary | ICD-10-CM | POA: Diagnosis not present

## 2019-08-23 DIAGNOSIS — I2510 Atherosclerotic heart disease of native coronary artery without angina pectoris: Secondary | ICD-10-CM | POA: Insufficient documentation

## 2019-08-23 NOTE — Discharge Instructions (Addendum)
Your x-ray tonight did not show any broken bones.  You likely have a plantars wart on your foot.  You can wear the postop shoe as needed.  Call your podiatrist (foot doctor) on Monday to arrange a follow-up appointment.  Take Tylenol extra strength every 6 hours as needed for pain.

## 2019-08-23 NOTE — ED Notes (Signed)
Pt reports 2-3 day history of pain to his R heel   Pt appears ? Cognitive issue   He has a lesion to his R plantar surface at the heel area

## 2019-08-23 NOTE — ED Triage Notes (Signed)
Woke up with pain in right heel

## 2019-08-24 NOTE — ED Provider Notes (Signed)
[facility] EMERGENCY DEPARTMENT Provider Note   CSN: [id number] Arrival date & time: 08/23/19  1808     History Chief Complaint  Patient presents with  . Foot Pain    [name] is a 54 y.o. male.  HPI     [name] is a 54 y.o. male with medical history significant for diabetes, who presents to the Emergency Department complaining of pain to the heel of the right foot.  Pain has been worsening for 2 to 3 days.  States is difficult to bear weight due to pain.  No known injury.  He denies swelling, numbness, or tingling of the foot.  Pain improves at rest and is worse with weightbearing.  Past Medical History:  Diagnosis Date  . Aortic insufficiency    mild to moderate   . Arteriosclerotic cardiovascular disease (ASCVD)    EF 60% -- Non-Q MI in 07/2008->  BMS to mid LAD  . Axillary abscess 2011   right  . Cerebrovascular disease    CVA  . Chronic obstructive pulmonary disease ([place])   . Diabetes mellitus type II   . Diabetes mellitus, type 2 (H[facility])    A1c of 5.6 in 05/2011  . Hyperlipidemia   . Hypertension   . Mild anemia    Result.  Nl H&H in 02/2011  . Obesity   . Schizophrenia ([place])    Schizophrenia/bipolar disease/mental retardation  . Sinus bradycardia    On low dose beta blocker  . Tobacco abuse    15 pack years; 0.5 pack per day    Patient Active Problem List   Diagnosis Date Noted  . Acute hypoxemic respiratory failure ([place]) 07/28/2019  . Elevated troponin 07/26/2019  . Vomiting 10/02/2018  . Acute on chronic renal failure ([place]) 02/06/2018  . Schizoaffective disorder, bipolar type ([place]) 01/04/2016  . Acute renal failure ([place]) 12/17/2012  . Dehydration 12/17/2012  . Diarrhea 12/17/2012  . Gastroenteritis 12/17/2012  . GERD (gastroesophageal reflux disease) 12/17/2012  . Metabolic acidosis 10/62/6948  . Chronic kidney disease, stage 2, mildly decreased GFR 03/19/2012  . CAD (coronary artery disease)   . Schizophrenia ([place])   .  Tobacco abuse   . Diabetes mellitus, type 2 ([place])   . Hypertension   . Hyperlipidemia   . Aortic insufficiency     Past Surgical History:  Procedure Laterality Date  . HERNIA REPAIR  1967       Family History  Problem Relation Age of Onset  . Hypertension Other   . Arthritis Other     Social History   Tobacco Use  . Smoking status: Current Every Day Smoker    Packs/day: 1.00    Years: 30.00    Pack years: 30.00    Types: Cigarettes    Start date: 12/03/1974  . Smokeless tobacco: Never Used  . Tobacco comment: Patient is not motivated to quit.  He attributes his smoking to the lack of other activities.  Substance Use Topics  . Alcohol use: No    Alcohol/week: 0.0 standard drinks  . Drug use: No    Home Medications Prior to Admission medications   Medication Sig Start Date End Date Taking? Authorizing Provider  acetaminophen (TYLENOL) 500 MG tablet Take 1 tablet (500 mg total) by mouth every 6 (six) hours as needed. 01/11/16   [name], PA-C  albuterol (VENTOLIN HFA) 108 (90 Base) MCG/ACT inhaler Inhale 2 puffs into the lungs every 6 (six) hours as needed for wheezing or shortness of  breath. 07/29/19   Manuella Ghazi, Pratik D, DO  aspirin EC 81 MG tablet Take 1 tablet (81 mg total) by mouth daily with breakfast. 07/26/19   Roxan Hockey, MD  carvedilol (COREG) 6.25 MG tablet Take 1 tablet (6.25 mg total) by mouth 2 (two) times daily. 07/26/19   Roxan Hockey, MD  cholecalciferol (VITAMIN D3) 25 MCG (1000 UNIT) tablet Take 1,000 Units by mouth daily.    [provider]  cloNIDine (CATAPRES) 0.1 MG tablet Take 1 tablet (0.1 mg total) by mouth 2 (two) times daily. 07/26/19   Roxan Hockey, MD  diclofenac sodium (VOLTAREN) 1 % GEL Apply 2 g topically 4 (four) times daily.    [provider]  fish oil-omega-3 fatty acids 1000 MG capsule Take 1 g by mouth in the morning, at noon, and at bedtime.     [provider]  furosemide (LASIX) 40 MG tablet Take  1 tablet (40 mg total) by mouth daily. For Fluid Patient taking differently: Take 40 mg by mouth daily.  07/26/19   Roxan Hockey, MD  haloperidol decanoate (HALDOL DECANOATE) 100 MG/ML injection Inject 100 mg into the muscle every 14 (fourteen) days.      [provider]  hydrALAZINE (APRESOLINE) 100 MG tablet Take 1 tablet (100 mg total) by mouth 3 (three) times daily. 07/26/19   Roxan Hockey, MD  insulin glargine (LANTUS) 100 UNIT/ML injection Inject 20 Units into the skin See admin instructions. Twice daily For blood sugar levels over 100. Do not administer for blood sugar levels lower than 100    [provider]  isosorbide mononitrate (IMDUR) 30 MG 24 hr tablet Take 1 tablet (30 mg total) by mouth daily. 07/26/19 07/25/20  Roxan Hockey, MD  loperamide (QC ANTI-DIARRHEAL) 2 MG tablet Take 2 mg by mouth 4 (four) times daily as needed for diarrhea or loose stools.    [provider]  Multiple Vitamin (DAILY VITE PO) Take 1 tablet by mouth every morning.     [provider]  NIFEdipine (ADALAT CC) 90 MG 24 hr tablet Take 1 tablet (90 mg total) by mouth daily. 07/26/19   Roxan Hockey, MD  nitroGLYCERIN (NITROSTAT) 0.4 MG SL tablet Place 0.4 mg under the tongue every 5 (five) minutes as needed for chest pain.    [provider]  omeprazole (PRILOSEC) 20 MG capsule Take 1 capsule (20 mg total) by mouth daily. 07/25/19   Henderly, Britni A, PA-C  potassium chloride (KLOR-CON) 10 MEQ tablet TAKE (1) TABLET BY MOUTH ONCE DAILY. Patient taking differently: Take 10 mEq by mouth daily.  07/26/19   Roxan Hockey, MD  pravastatin (PRAVACHOL) 40 MG tablet Take 1 tablet (40 mg total) by mouth at bedtime. 07/26/19   Roxan Hockey, MD  sodium bicarbonate 650 MG tablet Take 650 mg by mouth 3 (three) times daily.    [provider]  sucralfate (CARAFATE) 1 GM/10ML suspension Take 10 mLs (1 g total) by mouth 4 (four) times daily -  with meals and at  bedtime. Patient taking differently: Take 1 g by mouth 4 (four) times daily.  07/25/19   Henderly, Britni A, PA-C  traZODone (DESYREL) 50 MG tablet Take 50 mg by mouth at bedtime.    [provider]    Allergies    Patient has no known allergies.  Review of Systems   Review of Systems  Constitutional: Negative for chills and fever.  Genitourinary: Negative for difficulty urinating and dysuria.  Musculoskeletal: Positive for arthralgias (right  foot pain). Negative for joint swelling.  Skin: Negative for color change, rash and wound.  Neurological: Negative for numbness.    Physical Exam Updated Vital Signs BP (!) 177/73   Pulse 73   Temp 98.6 F (37 C) (Oral)   Resp 18   Wt 104.3 kg   SpO2 99%   BMI 31.19 kg/m   Physical Exam Vitals and nursing note reviewed.  Constitutional:      General: He is not in acute distress.    Appearance: Normal appearance.  Cardiovascular:     Rate and Rhythm: Normal rate and regular rhythm.     Pulses: Normal pulses.  Pulmonary:     Effort: Pulmonary effort is normal.  Skin:    General: Skin is warm.     Capillary Refill: Capillary refill takes less than 2 seconds.     Findings: No bruising, erythema or rash.     Comments: Tender to palpation of the right heel.  Plantar wart present.  No open wound, erythema or ecchymosis.  No proximal tenderness.  Neurological:     General: No focal deficit present.     Mental Status: He is alert.     Sensory: No sensory deficit.     Motor: No weakness.     ED Results / Procedures / Treatments   Labs (all labs ordered are listed, but only abnormal results are displayed) Labs Reviewed - No data to display  EKG None  Radiology DG Foot Complete Right  Result Date: 08/23/2019 CLINICAL DATA:  Right heel pain EXAM: RIGHT FOOT COMPLETE - 3+ VIEW COMPARISON:  None. FINDINGS: No acute fracture or traumatic osseous injury. No clear trabecular disruption in the calcaneus. Large bidirectional  calcaneal spurs are noted as well as likely enthesopathic mineralization along the plantar soft tissues. No fractured enthesophyte is evident. There is a more ovoid mineralization posterior to base of the fifth metatarsal which could reflect retraction of an os peroneum given more posterior and lateral positioning than is typical. Mild hallux valgus is noted. 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 Alignment at the ankle is grossly preserved. IMPRESSION: 1. No acute osseous abnormality. 2. Bidirectional calcaneal spurs with likely enthesopathic mineralization along the plantar soft tissues. 3. More ovoid mineralization posterior to the base of the fifth metatarsal could reflect retraction of an os peroneum. Correlate for point tenderness. 4. Mild hallux valgus. Electronically Signed   By: Lovena Le M.D.   On: 08/23/2019 20:02    Procedures Procedures (including critical care time)  Medications Ordered in ED Medications - No data to display  ED Course  I have reviewed the triage vital signs and the nursing notes.  Pertinent labs & imaging results that were available during my care of the patient were reviewed by me and considered in my medical decision making (see chart for details).    MDM Rules/Calculators/A&P                      Patient with right heel pain for several days.  Neurovascularly intact.  No open wound or skin changes.  There is a plantar wart to the heel.  X-ray without acute bony findings. Postop shoe applied.  Patient has seen podiatry in the past, agrees to arrange follow-up.  Final Clinical Impression(s) / ED Diagnoses Final diagnoses:  Foot pain, right  Plantar wart, right foot    Rx / DC Orders ED Discharge Orders    None  Kem Parkinson, PA-C 08/26/19 1147    Milton Ferguson, MD 08/28/19 1456

## 2019-09-11 ENCOUNTER — Other Ambulatory Visit: Payer: Self-pay | Admitting: *Deleted

## 2019-09-11 DIAGNOSIS — N182 Chronic kidney disease, stage 2 (mild): Secondary | ICD-10-CM

## 2019-09-13 ENCOUNTER — Ambulatory Visit (HOSPITAL_COMMUNITY): Payer: Medicaid Other

## 2019-09-13 ENCOUNTER — Ambulatory Visit (HOSPITAL_COMMUNITY): Admission: RE | Admit: 2019-09-13 | Payer: Medicaid Other | Source: Ambulatory Visit

## 2019-09-13 ENCOUNTER — Encounter: Payer: Medicaid Other | Admitting: Vascular Surgery

## 2019-10-15 ENCOUNTER — Inpatient Hospital Stay (HOSPITAL_COMMUNITY)
Admission: EM | Admit: 2019-10-15 | Discharge: 2019-10-19 | DRG: 640 | Disposition: A | Discharge status: Skilled Nursing Facility | Payer: Medicaid Other | Source: Skilled Nursing Facility | Attending: Internal Medicine | Admitting: Internal Medicine

## 2019-10-15 ENCOUNTER — Emergency Department (HOSPITAL_COMMUNITY): Payer: Medicaid Other

## 2019-10-15 DIAGNOSIS — I50810 Right heart failure, unspecified: Secondary | ICD-10-CM | POA: Diagnosis present

## 2019-10-15 DIAGNOSIS — Z6833 Body mass index (BMI) 33.0-33.9, adult: Secondary | ICD-10-CM

## 2019-10-15 DIAGNOSIS — N185 Chronic kidney disease, stage 5: Secondary | ICD-10-CM | POA: Diagnosis present

## 2019-10-15 DIAGNOSIS — I248 Other forms of acute ischemic heart disease: Secondary | ICD-10-CM | POA: Diagnosis present

## 2019-10-15 DIAGNOSIS — J449 Chronic obstructive pulmonary disease, unspecified: Secondary | ICD-10-CM | POA: Diagnosis present

## 2019-10-15 DIAGNOSIS — E8779 Other fluid overload: Principal | ICD-10-CM | POA: Diagnosis present

## 2019-10-15 DIAGNOSIS — I2510 Atherosclerotic heart disease of native coronary artery without angina pectoris: Secondary | ICD-10-CM | POA: Diagnosis present

## 2019-10-15 DIAGNOSIS — Z8249 Family history of ischemic heart disease and other diseases of the circulatory system: Secondary | ICD-10-CM

## 2019-10-15 DIAGNOSIS — J9621 Acute and chronic respiratory failure with hypoxia: Secondary | ICD-10-CM | POA: Diagnosis present

## 2019-10-15 DIAGNOSIS — R778 Other specified abnormalities of plasma proteins: Secondary | ICD-10-CM | POA: Diagnosis present

## 2019-10-15 DIAGNOSIS — Z20822 Contact with and (suspected) exposure to covid-19: Secondary | ICD-10-CM | POA: Diagnosis present

## 2019-10-15 DIAGNOSIS — E785 Hyperlipidemia, unspecified: Secondary | ICD-10-CM | POA: Diagnosis present

## 2019-10-15 DIAGNOSIS — K219 Gastro-esophageal reflux disease without esophagitis: Secondary | ICD-10-CM | POA: Diagnosis present

## 2019-10-15 DIAGNOSIS — I252 Old myocardial infarction: Secondary | ICD-10-CM

## 2019-10-15 DIAGNOSIS — N189 Chronic kidney disease, unspecified: Secondary | ICD-10-CM

## 2019-10-15 DIAGNOSIS — F79 Unspecified intellectual disabilities: Secondary | ICD-10-CM | POA: Diagnosis present

## 2019-10-15 DIAGNOSIS — Z794 Long term (current) use of insulin: Secondary | ICD-10-CM

## 2019-10-15 DIAGNOSIS — I4581 Long QT syndrome: Secondary | ICD-10-CM | POA: Diagnosis present

## 2019-10-15 DIAGNOSIS — Z7982 Long term (current) use of aspirin: Secondary | ICD-10-CM

## 2019-10-15 DIAGNOSIS — R0902 Hypoxemia: Secondary | ICD-10-CM

## 2019-10-15 DIAGNOSIS — Z79899 Other long term (current) drug therapy: Secondary | ICD-10-CM

## 2019-10-15 DIAGNOSIS — I5032 Chronic diastolic (congestive) heart failure: Secondary | ICD-10-CM | POA: Diagnosis present

## 2019-10-15 DIAGNOSIS — I132 Hypertensive heart and chronic kidney disease with heart failure and with stage 5 chronic kidney disease, or end stage renal disease: Secondary | ICD-10-CM | POA: Diagnosis present

## 2019-10-15 DIAGNOSIS — R5381 Other malaise: Secondary | ICD-10-CM | POA: Diagnosis present

## 2019-10-15 DIAGNOSIS — E1122 Type 2 diabetes mellitus with diabetic chronic kidney disease: Secondary | ICD-10-CM | POA: Diagnosis present

## 2019-10-15 DIAGNOSIS — E8770 Fluid overload, unspecified: Secondary | ICD-10-CM | POA: Diagnosis present

## 2019-10-15 DIAGNOSIS — E119 Type 2 diabetes mellitus without complications: Secondary | ICD-10-CM

## 2019-10-15 DIAGNOSIS — Z8673 Personal history of transient ischemic attack (TIA), and cerebral infarction without residual deficits: Secondary | ICD-10-CM

## 2019-10-15 DIAGNOSIS — N179 Acute kidney failure, unspecified: Secondary | ICD-10-CM | POA: Diagnosis present

## 2019-10-15 DIAGNOSIS — F17210 Nicotine dependence, cigarettes, uncomplicated: Secondary | ICD-10-CM | POA: Diagnosis present

## 2019-10-15 DIAGNOSIS — I352 Nonrheumatic aortic (valve) stenosis with insufficiency: Secondary | ICD-10-CM | POA: Diagnosis present

## 2019-10-15 DIAGNOSIS — D631 Anemia in chronic kidney disease: Secondary | ICD-10-CM | POA: Diagnosis present

## 2019-10-15 DIAGNOSIS — F250 Schizoaffective disorder, bipolar type: Secondary | ICD-10-CM | POA: Diagnosis present

## 2019-10-15 DIAGNOSIS — R0602 Shortness of breath: Secondary | ICD-10-CM

## 2019-10-15 DIAGNOSIS — J9811 Atelectasis: Secondary | ICD-10-CM | POA: Diagnosis present

## 2019-10-15 DIAGNOSIS — Z9981 Dependence on supplemental oxygen: Secondary | ICD-10-CM

## 2019-10-15 DIAGNOSIS — E669 Obesity, unspecified: Secondary | ICD-10-CM | POA: Diagnosis present

## 2019-10-15 DIAGNOSIS — I10 Essential (primary) hypertension: Secondary | ICD-10-CM | POA: Diagnosis present

## 2019-10-15 DIAGNOSIS — Z9861 Coronary angioplasty status: Secondary | ICD-10-CM

## 2019-10-15 LAB — CBC WITH DIFFERENTIAL/PLATELET
Abs Immature Granulocytes: 0.11 10*3/uL — ABNORMAL HIGH (ref 0.00–0.07)
Basophils Absolute: 0 10*3/uL (ref 0.0–0.1)
Basophils Relative: 0 %
Eosinophils Absolute: 0.1 10*3/uL (ref 0.0–0.5)
Eosinophils Relative: 1 %
HCT: 26.5 % — ABNORMAL LOW (ref 39.0–52.0)
Hemoglobin: 8.4 g/dL — ABNORMAL LOW (ref 13.0–17.0)
Immature Granulocytes: 1 %
Lymphocytes Relative: 4 %
Lymphs Abs: 0.5 10*3/uL — ABNORMAL LOW (ref 0.7–4.0)
MCH: 26.7 pg (ref 26.0–34.0)
MCHC: 31.7 g/dL (ref 30.0–36.0)
MCV: 84.1 fL (ref 80.0–100.0)
Monocytes Absolute: 0.7 10*3/uL (ref 0.1–1.0)
Monocytes Relative: 6 %
Neutro Abs: 9.4 10*3/uL — ABNORMAL HIGH (ref 1.7–7.7)
Neutrophils Relative %: 88 %
Platelets: 172 10*3/uL (ref 150–400)
RBC: 3.15 MIL/uL — ABNORMAL LOW (ref 4.22–5.81)
RDW: 16.3 % — ABNORMAL HIGH (ref 11.5–15.5)
WBC: 10.7 10*3/uL — ABNORMAL HIGH (ref 4.0–10.5)
nRBC: 0 % (ref 0.0–0.2)

## 2019-10-15 MED ORDER — ALBUTEROL SULFATE (2.5 MG/3ML) 0.083% IN NEBU
5.0000 mg | INHALATION_SOLUTION | Freq: Once | RESPIRATORY_TRACT | Status: AC
  Administered 2019-10-15: 5 mg via RESPIRATORY_TRACT
  Filled 2019-10-15: qty 6

## 2019-10-15 NOTE — ED Triage Notes (Signed)
Pt a resident of [facility]. EMS called out for SOB. Pt's O2 sats were 66% on RA upon EMS arrival and pt was diaphoretic. EMS placed pt on NRB and sats came up to 98%. Staff reported to EMS that pt c/o CP around 4pm yesterday and was given 2sl Nitro by them and had no other issues until they were called out tonight. Pt denies CP at present.

## 2019-10-16 ENCOUNTER — Other Ambulatory Visit: Payer: Self-pay

## 2019-10-16 ENCOUNTER — Observation Stay (HOSPITAL_COMMUNITY): Payer: Medicaid Other

## 2019-10-16 ENCOUNTER — Encounter (HOSPITAL_COMMUNITY): Payer: Self-pay | Admitting: Internal Medicine

## 2019-10-16 DIAGNOSIS — Z20822 Contact with and (suspected) exposure to covid-19: Secondary | ICD-10-CM | POA: Diagnosis present

## 2019-10-16 DIAGNOSIS — N179 Acute kidney failure, unspecified: Secondary | ICD-10-CM | POA: Diagnosis present

## 2019-10-16 DIAGNOSIS — N185 Chronic kidney disease, stage 5: Secondary | ICD-10-CM | POA: Diagnosis present

## 2019-10-16 DIAGNOSIS — Z9861 Coronary angioplasty status: Secondary | ICD-10-CM | POA: Diagnosis not present

## 2019-10-16 DIAGNOSIS — E8779 Other fluid overload: Secondary | ICD-10-CM | POA: Diagnosis present

## 2019-10-16 DIAGNOSIS — I2510 Atherosclerotic heart disease of native coronary artery without angina pectoris: Secondary | ICD-10-CM

## 2019-10-16 DIAGNOSIS — R778 Other specified abnormalities of plasma proteins: Secondary | ICD-10-CM | POA: Diagnosis not present

## 2019-10-16 DIAGNOSIS — F250 Schizoaffective disorder, bipolar type: Secondary | ICD-10-CM | POA: Diagnosis present

## 2019-10-16 DIAGNOSIS — N170 Acute kidney failure with tubular necrosis: Secondary | ICD-10-CM | POA: Diagnosis not present

## 2019-10-16 DIAGNOSIS — I248 Other forms of acute ischemic heart disease: Secondary | ICD-10-CM | POA: Diagnosis present

## 2019-10-16 DIAGNOSIS — E782 Mixed hyperlipidemia: Secondary | ICD-10-CM | POA: Diagnosis not present

## 2019-10-16 DIAGNOSIS — D631 Anemia in chronic kidney disease: Secondary | ICD-10-CM | POA: Diagnosis present

## 2019-10-16 DIAGNOSIS — E8770 Fluid overload, unspecified: Secondary | ICD-10-CM | POA: Diagnosis present

## 2019-10-16 DIAGNOSIS — N181 Chronic kidney disease, stage 1: Secondary | ICD-10-CM | POA: Diagnosis not present

## 2019-10-16 DIAGNOSIS — Z6833 Body mass index (BMI) 33.0-33.9, adult: Secondary | ICD-10-CM | POA: Diagnosis not present

## 2019-10-16 DIAGNOSIS — I132 Hypertensive heart and chronic kidney disease with heart failure and with stage 5 chronic kidney disease, or end stage renal disease: Secondary | ICD-10-CM | POA: Diagnosis present

## 2019-10-16 DIAGNOSIS — J9621 Acute and chronic respiratory failure with hypoxia: Secondary | ICD-10-CM

## 2019-10-16 DIAGNOSIS — I10 Essential (primary) hypertension: Secondary | ICD-10-CM

## 2019-10-16 DIAGNOSIS — E785 Hyperlipidemia, unspecified: Secondary | ICD-10-CM | POA: Diagnosis present

## 2019-10-16 DIAGNOSIS — J9811 Atelectasis: Secondary | ICD-10-CM | POA: Diagnosis present

## 2019-10-16 DIAGNOSIS — N178 Other acute kidney failure: Secondary | ICD-10-CM | POA: Diagnosis not present

## 2019-10-16 DIAGNOSIS — Z9981 Dependence on supplemental oxygen: Secondary | ICD-10-CM | POA: Diagnosis not present

## 2019-10-16 DIAGNOSIS — I5032 Chronic diastolic (congestive) heart failure: Secondary | ICD-10-CM | POA: Diagnosis present

## 2019-10-16 DIAGNOSIS — K219 Gastro-esophageal reflux disease without esophagitis: Secondary | ICD-10-CM | POA: Diagnosis present

## 2019-10-16 DIAGNOSIS — R5381 Other malaise: Secondary | ICD-10-CM | POA: Diagnosis present

## 2019-10-16 DIAGNOSIS — E1122 Type 2 diabetes mellitus with diabetic chronic kidney disease: Secondary | ICD-10-CM | POA: Diagnosis present

## 2019-10-16 DIAGNOSIS — I50810 Right heart failure, unspecified: Secondary | ICD-10-CM | POA: Diagnosis present

## 2019-10-16 DIAGNOSIS — F17210 Nicotine dependence, cigarettes, uncomplicated: Secondary | ICD-10-CM | POA: Diagnosis present

## 2019-10-16 DIAGNOSIS — J449 Chronic obstructive pulmonary disease, unspecified: Secondary | ICD-10-CM | POA: Diagnosis present

## 2019-10-16 DIAGNOSIS — I352 Nonrheumatic aortic (valve) stenosis with insufficiency: Secondary | ICD-10-CM | POA: Diagnosis present

## 2019-10-16 DIAGNOSIS — E669 Obesity, unspecified: Secondary | ICD-10-CM | POA: Diagnosis present

## 2019-10-16 LAB — COMPREHENSIVE METABOLIC PANEL
ALT: 15 U/L (ref 0–44)
AST: 16 U/L (ref 15–41)
Albumin: 3.4 g/dL — ABNORMAL LOW (ref 3.5–5.0)
Alkaline Phosphatase: 109 U/L (ref 38–126)
Anion gap: 10 (ref 5–15)
BUN: 47 mg/dL — ABNORMAL HIGH (ref 6–20)
CO2: 24 mmol/L (ref 22–32)
Calcium: 8.5 mg/dL — ABNORMAL LOW (ref 8.9–10.3)
Chloride: 100 mmol/L (ref 98–111)
Creatinine, Ser: 5.58 mg/dL — ABNORMAL HIGH (ref 0.61–1.24)
GFR calc Af Amer: 12 mL/min — ABNORMAL LOW (ref >60)
GFR calc non Af Amer: 11 mL/min — ABNORMAL LOW (ref >60)
Glucose, Bld: 154 mg/dL — ABNORMAL HIGH (ref 70–99)
Potassium: 4 mmol/L (ref 3.5–5.1)
Sodium: 134 mmol/L — ABNORMAL LOW (ref 135–145)
Total Bilirubin: 0.6 mg/dL (ref 0.3–1.2)
Total Protein: 7.2 g/dL (ref 6.5–8.1)

## 2019-10-16 LAB — RENAL FUNCTION PANEL
Albumin: 3.2 g/dL — ABNORMAL LOW (ref 3.5–5.0)
Anion gap: 12 (ref 5–15)
BUN: 49 mg/dL — ABNORMAL HIGH (ref 6–20)
CO2: 25 mmol/L (ref 22–32)
Calcium: 8.9 mg/dL (ref 8.9–10.3)
Chloride: 100 mmol/L (ref 98–111)
Creatinine, Ser: 5.9 mg/dL — ABNORMAL HIGH (ref 0.61–1.24)
GFR calc Af Amer: 11 mL/min — ABNORMAL LOW (ref >60)
GFR calc non Af Amer: 10 mL/min — ABNORMAL LOW (ref >60)
Glucose, Bld: 103 mg/dL — ABNORMAL HIGH (ref 70–99)
Phosphorus: 5.3 mg/dL — ABNORMAL HIGH (ref 2.5–4.6)
Potassium: 4.2 mmol/L (ref 3.5–5.1)
Sodium: 137 mmol/L (ref 135–145)

## 2019-10-16 LAB — FERRITIN: Ferritin: 283 ng/mL (ref 24–336)

## 2019-10-16 LAB — IRON AND TIBC
Iron: 14 ug/dL — ABNORMAL LOW (ref 45–182)
Saturation Ratios: 6 % — ABNORMAL LOW (ref 17.9–39.5)
TIBC: 235 ug/dL — ABNORMAL LOW (ref 250–450)
UIBC: 221 ug/dL

## 2019-10-16 LAB — HEMOGLOBIN A1C
Hgb A1c MFr Bld: 5.6 % (ref 4.8–5.6)
Mean Plasma Glucose: 114.02 mg/dL

## 2019-10-16 LAB — GLUCOSE, CAPILLARY
Glucose-Capillary: 101 mg/dL — ABNORMAL HIGH (ref 70–99)
Glucose-Capillary: 98 mg/dL (ref 70–99)
Glucose-Capillary: 129 mg/dL — ABNORMAL HIGH (ref 70–99)

## 2019-10-16 LAB — CBG MONITORING, ED: Glucose-Capillary: 104 mg/dL — ABNORMAL HIGH (ref 70–99)

## 2019-10-16 LAB — TROPONIN I (HIGH SENSITIVITY)
Troponin I (High Sensitivity): 167 ng/L (ref <18)
Troponin I (High Sensitivity): 171 ng/L (ref <18)

## 2019-10-16 LAB — SARS CORONAVIRUS 2 BY RT PCR (HOSPITAL ORDER, PERFORMED IN ~~LOC~~ HOSPITAL LAB): SARS Coronavirus 2: NEGATIVE

## 2019-10-16 LAB — PROCALCITONIN: Procalcitonin: 0.34 ng/mL

## 2019-10-16 LAB — BRAIN NATRIURETIC PEPTIDE: B Natriuretic Peptide: 330 pg/mL — ABNORMAL HIGH (ref 0.0–100.0)

## 2019-10-16 MED ORDER — ONDANSETRON HCL 4 MG/2ML IJ SOLN: 4.0000 mg | Freq: Four times a day (QID) | INTRAMUSCULAR | Status: DC | PRN

## 2019-10-16 MED ORDER — NITROGLYCERIN IN D5W 200-5 MCG/ML-% IV SOLN: 5.0000 ug/min | INTRAVENOUS | Status: DC

## 2019-10-16 MED ORDER — ALBUTEROL SULFATE (2.5 MG/3ML) 0.083% IN NEBU: 2.5000 mg | INHALATION_SOLUTION | Freq: Four times a day (QID) | RESPIRATORY_TRACT | Status: DC | PRN

## 2019-10-16 MED ORDER — ISOSORBIDE MONONITRATE ER 60 MG PO TB24
30.0000 mg | ORAL_TABLET | Freq: Every day | ORAL | Status: DC
  Administered 2019-10-16 – 2019-10-19 (×4): 30 mg via ORAL
  Filled 2019-10-16 (×7): qty 1

## 2019-10-16 MED ORDER — FUROSEMIDE 10 MG/ML IJ SOLN
120.0000 mg | Freq: Once | INTRAVENOUS | Status: AC
  Administered 2019-10-16: 120 mg via INTRAVENOUS
  Filled 2019-10-16: qty 10

## 2019-10-16 MED ORDER — OMEGA-3 FATTY ACIDS 1000 MG PO CAPS
1.0000 g | ORAL_CAPSULE | Freq: Three times a day (TID) | ORAL | Status: DC
  Administered 2019-10-16 – 2019-10-18 (×7): 1 g via ORAL
  Filled 2019-10-16 (×22): qty 1

## 2019-10-16 MED ORDER — NIFEDIPINE ER OSMOTIC RELEASE 30 MG PO TB24: 90.0000 mg | ORAL_TABLET | Freq: Every day | ORAL | Status: DC

## 2019-10-16 MED ORDER — ALBUTEROL SULFATE HFA 108 (90 BASE) MCG/ACT IN AERS: 2.0000 | INHALATION_SPRAY | Freq: Four times a day (QID) | RESPIRATORY_TRACT | Status: DC | PRN

## 2019-10-16 MED ORDER — LOPERAMIDE HCL 2 MG PO CAPS: 2.0000 mg | ORAL_CAPSULE | Freq: Four times a day (QID) | ORAL | Status: DC | PRN

## 2019-10-16 MED ORDER — ACETAMINOPHEN 650 MG RE SUPP: 650.0000 mg | Freq: Four times a day (QID) | RECTAL | Status: DC | PRN

## 2019-10-16 MED ORDER — DICLOFENAC SODIUM 1 % TD GEL
2.0000 g | Freq: Four times a day (QID) | TRANSDERMAL | Status: DC
  Administered 2019-10-16 – 2019-10-19 (×6): 2 g via TOPICAL
  Filled 2019-10-16: qty 100

## 2019-10-16 MED ORDER — VITAMIN D 25 MCG (1000 UNIT) PO TABS
1000.0000 [IU] | ORAL_TABLET | Freq: Every day | ORAL | Status: DC
  Administered 2019-10-16 – 2019-10-19 (×4): 1000 [IU] via ORAL
  Filled 2019-10-16 (×4): qty 1

## 2019-10-16 MED ORDER — INSULIN ASPART 100 UNIT/ML ~~LOC~~ SOLN: 0.0000 [IU] | Freq: Every day | SUBCUTANEOUS | Status: DC

## 2019-10-16 MED ORDER — FUROSEMIDE 10 MG/ML IJ SOLN
INTRAMUSCULAR | Status: AC
  Filled 2019-10-16: qty 20

## 2019-10-16 MED ORDER — ONDANSETRON HCL 4 MG PO TABS: 4.0000 mg | ORAL_TABLET | Freq: Four times a day (QID) | ORAL | Status: DC | PRN

## 2019-10-16 MED ORDER — PRAVASTATIN SODIUM 40 MG PO TABS
40.0000 mg | ORAL_TABLET | Freq: Every day | ORAL | Status: DC
  Administered 2019-10-16 – 2019-10-18 (×3): 40 mg via ORAL
  Filled 2019-10-16 (×3): qty 1

## 2019-10-16 MED ORDER — INSULIN ASPART 100 UNIT/ML ~~LOC~~ SOLN
0.0000 [IU] | Freq: Three times a day (TID) | SUBCUTANEOUS | Status: DC
  Administered 2019-10-17: 1 [IU] via SUBCUTANEOUS

## 2019-10-16 MED ORDER — ASPIRIN EC 81 MG PO TBEC
81.0000 mg | DELAYED_RELEASE_TABLET | Freq: Every day | ORAL | Status: DC
  Administered 2019-10-17 – 2019-10-19 (×3): 81 mg via ORAL
  Filled 2019-10-16 (×3): qty 1

## 2019-10-16 MED ORDER — PANTOPRAZOLE SODIUM 40 MG PO TBEC
40.0000 mg | DELAYED_RELEASE_TABLET | Freq: Every day | ORAL | Status: DC
  Administered 2019-10-16 – 2019-10-19 (×4): 40 mg via ORAL
  Filled 2019-10-16 (×4): qty 1

## 2019-10-16 MED ORDER — ENOXAPARIN SODIUM 30 MG/0.3ML ~~LOC~~ SOLN: 30.0000 mg | SUBCUTANEOUS | Status: DC

## 2019-10-16 MED ORDER — SUCRALFATE 1 GM/10ML PO SUSP
1.0000 g | Freq: Four times a day (QID) | ORAL | Status: DC
  Administered 2019-10-16 – 2019-10-19 (×12): 1 g via ORAL
  Filled 2019-10-16 (×12): qty 10

## 2019-10-16 MED ORDER — SODIUM BICARBONATE 650 MG PO TABS
650.0000 mg | ORAL_TABLET | Freq: Three times a day (TID) | ORAL | Status: DC
  Administered 2019-10-16 – 2019-10-19 (×10): 650 mg via ORAL
  Filled 2019-10-16 (×17): qty 1

## 2019-10-16 MED ORDER — FUROSEMIDE 10 MG/ML IJ SOLN
80.0000 mg | Freq: Two times a day (BID) | INTRAMUSCULAR | Status: DC
  Administered 2019-10-16 – 2019-10-17 (×3): 80 mg via INTRAVENOUS
  Filled 2019-10-16 (×3): qty 8

## 2019-10-16 MED ORDER — CARVEDILOL 3.125 MG PO TABS
6.2500 mg | ORAL_TABLET | Freq: Two times a day (BID) | ORAL | Status: DC
  Administered 2019-10-16 – 2019-10-18 (×4): 6.25 mg via ORAL
  Filled 2019-10-16 (×4): qty 2

## 2019-10-16 MED ORDER — HYDRALAZINE HCL 25 MG PO TABS
100.0000 mg | ORAL_TABLET | Freq: Three times a day (TID) | ORAL | Status: DC
  Administered 2019-10-16 – 2019-10-19 (×10): 100 mg via ORAL
  Filled 2019-10-16 (×11): qty 4

## 2019-10-16 MED ORDER — NITROGLYCERIN 2 % TD OINT
1.0000 [in_us] | TOPICAL_OINTMENT | Freq: Four times a day (QID) | TRANSDERMAL | Status: AC
  Administered 2019-10-16: 1 [in_us] via TOPICAL
  Filled 2019-10-16: qty 1

## 2019-10-16 MED ORDER — ACETAMINOPHEN 325 MG PO TABS: 650.0000 mg | ORAL_TABLET | Freq: Four times a day (QID) | ORAL | Status: DC | PRN

## 2019-10-16 MED ORDER — HEPARIN SODIUM (PORCINE) 5000 UNIT/ML IJ SOLN
5000.0000 [IU] | Freq: Three times a day (TID) | INTRAMUSCULAR | Status: DC
  Administered 2019-10-16 – 2019-10-19 (×9): 5000 [IU] via SUBCUTANEOUS
  Filled 2019-10-16 (×9): qty 1

## 2019-10-16 MED ORDER — CLONIDINE HCL 0.1 MG PO TABS: 0.1000 mg | ORAL_TABLET | Freq: Two times a day (BID) | ORAL | Status: DC

## 2019-10-16 NOTE — Progress Notes (Signed)
PROGRESS NOTE  FREMAN LAPAGE LAG:[id number] DOB: 08/03/1965 DOA: 10/15/2019 PCP: Rosita Fire, MD  Brief History:  54 year old male with a history of chronic respiratory failure on 2 L, COPD, CKD stage IV, hypertension, schizo affective disorder, hyperlipidemia, depression, diabetes mellitus type 2, tobacco abuse, coronary artery disease presenting with 1 week history of worsening shortness of breath, orthopnea, and increasing abdominal girth.  The patient has had 2 recent hospitalizations, one from 07/28/2019 to 07/29/2019 for acute on chronic respiratory failure secondary to COPD exacerbation.  In addition, the patient was also admitted from 07/25/2019 until 07/26/2019 for chest pain.  Cardiology did not feel the patient needed further work-up at that time. He denies any fevers, chills, chest pain, nausea, vomiting, diarrhea, abdominal pain.  He has been complaining of increasing abdominal swelling and leg edema.  He has been having orthopnea type symptoms.  He denies any NSAIDs. In the emergency department, the patient was afebrile hemodynamically stable.  He was placed on BiPAP secondary to hypoxia.  Chest x-ray shows stable interstitial prominence and stable right lower lobe atelectasis/infiltrate.  BMP showed sodium 134, serum creatinine 5.58.  WBC was 10.7.  The patient was started on furosemide 120 mg IV.  Assessment/Plan: Acute on chronic respiratory failure with hypoxia -Secondary to fluid overload -Chronically on 2 L nasal cannula prior to admission -Placed on BiPAP in the emergency department -Wean oxygen back to baseline as tolerated -Personally reviewed chest x-ray--interstitial prominence, right lower lobe opacity  Fluid overload -Secondary to progression of underlying CKD -Nephrology consult -Continue IV furosemide -07/26/2019 echo EF 60 to 65%, no WMA, mild to moderate AAS, severe LVH  Acute on chronic renal failure--CKD stage V -Suspect the patient likely has  progression of underlying CKD -Baseline creatinine 4.5-4.7 -Renal ultrasound  Essential hypertension -Restart carvedilol -Continue hydralazine and Imdur -Holding nifedipine and clonidine temporarily  Coronary artery disease -BMS to LAD 07/2008 -Continue aspirin -No chest pain presently -Continue aspirin and carvedilol -Continue Imdur -Personally reviewed EKG--sinus rhythm, LVH changes  Hyperlipidemia -Continue statin  Diabetes mellitus type 2 -Start reduced dose Lantus -NovoLog sliding scale -07/26/2019 hemoglobin A1c 5.8 -Recheck hemoglobin A1c  Schizoaffective disorder -Patient on haloperidol decanoate every 14 days prior to admission     Status is: Observation  The patient will require care spanning > 2 midnights and should be moved to inpatient because: IV treatments appropriate due to intensity of illness or inability to take PO  He remains fluid overloaded requiring IV lasix  Dispo: The patient is from: Group home              Anticipated d/c is to: Group home              Anticipated d/c date is: 3 days              Patient currently is not medically stable to d/c.        Family Communication: no  Family at bedside  Consultants:  renal  Code Status:  FULL  DVT Prophylaxis:  [place] Heparin   Procedures: As Listed in Progress Note Above  Antibiotics: None   Total time spent 35 minutes.  Greater than 50% spent face to face counseling and coordinating care.    Subjective:  Patient denies fevers, chills, headache, chest pain, dyspnea, nausea, vomiting, diarrhea, abdominal pain, dysuria, hematuria, hematochezia, and melena.  Objective: Vitals:   10/16/19 0400 10/16/19 0430 10/16/19 0500 10/16/19 0600  BP: (!) 143/70  Marland Kitchen)  144/69 135/67  Pulse: 75 73 75 73  Resp: $Remo'18 18 16 18  'EJteb$ SpO2: 100% 98% 98% 100%  Weight:      Height:        Intake/Output Summary (Last 24 hours) at 10/16/2019 0657 Last data filed at 10/16/2019 0550 Gross per 24 hour   Intake 55.44 ml  Output 400 ml  Net -344.56 ml   Weight change:  Exam:   General:  Pt is alert, follows commands appropriately, not in acute distress  HEENT: No icterus, No thrush, No neck mass, [place]/AT  Cardiovascular: RRR, S1/S2, no rubs, no gallops  Respiratory: bilateral rales. No wheeze  Abdomen: Soft/+BS, non tender, non distended, no guarding  Extremities: 1+LE edema, No lymphangitis, No petechiae, No rashes, no synovitis   Data Reviewed: I have personally reviewed following labs and imaging studies Basic Metabolic Panel: Recent Labs  Lab 10/15/19 2327  NA 134*  K 4.0  CL 100  CO2 24  GLUCOSE 154*  BUN 47*  CREATININE 5.58*  CALCIUM 8.5*   Liver Function Tests: Recent Labs  Lab 10/15/19 2327  AST 16  ALT 15  ALKPHOS 109  BILITOT 0.6  PROT 7.2  ALBUMIN 3.4*   No results for input(s): LIPASE, AMYLASE in the last 168 hours. No results for input(s): AMMONIA in the last 168 hours. Coagulation Profile: No results for input(s): INR, PROTIME in the last 168 hours. CBC: Recent Labs  Lab 10/15/19 2327  WBC 10.7*  NEUTROABS 9.4*  HGB 8.4*  HCT 26.5*  MCV 84.1  PLT 172   Cardiac Enzymes: No results for input(s): CKTOTAL, CKMB, CKMBINDEX, TROPONINI in the last 168 hours. BNP: Invalid input(s): POCBNP CBG: No results for input(s): GLUCAP in the last 168 hours. HbA1C: No results for input(s): HGBA1C in the last 72 hours. Urine analysis:    Component Value Date/Time   COLORURINE STRAW (A) 07/25/2019 2157   APPEARANCEUR CLEAR 07/25/2019 2157   LABSPEC 1.003 (L) 07/25/2019 2157   PHURINE 6.0 07/25/2019 2157   GLUCOSEU NEGATIVE 07/25/2019 2157   HGBUR NEGATIVE 07/25/2019 2157   BILIRUBINUR NEGATIVE 07/25/2019 2157   KETONESUR NEGATIVE 07/25/2019 2157   PROTEINUR >=300 (A) 07/25/2019 2157   UROBILINOGEN 0.2 12/17/2012 1644   NITRITE NEGATIVE 07/25/2019 2157   LEUKOCYTESUR NEGATIVE 07/25/2019 2157   Sepsis  Labs: $Remo'@[email or web address]'JiOHG$ (procalcitonin:4,lacticidven:4) ) Recent Results (from the past 240 hour(s))  SARS Coronavirus 2 by RT PCR (hospital order, performed in [facility] hospital lab) Nasopharyngeal Nasopharyngeal Swab     Status: None   Collection Time: 10/15/19 11:17 PM   Specimen: Nasopharyngeal Swab  Result Value Ref Range Status   SARS Coronavirus 2 NEGATIVE NEGATIVE Final    Comment: (NOTE) SARS-CoV-2 target nucleic acids are NOT DETECTED.  The SARS-CoV-2 RNA is generally detectable in upper and lower respiratory specimens during the acute phase of infection. The lowest concentration of SARS-CoV-2 viral copies this assay can detect is 250 copies / mL. A negative result does not preclude SARS-CoV-2 infection and should not be used as the sole basis for treatment or other patient management decisions.  A negative result may occur with improper specimen collection / handling, submission of specimen other than nasopharyngeal swab, presence of viral mutation(s) within the areas targeted by this assay, and inadequate number of viral copies (<250 copies / mL). A negative result must be combined with clinical observations, patient history, and epidemiological information.  Fact Sheet for Patients:   StrictlyIdeas.no  Fact Sheet for Healthcare Providers: BankingDealers.co.za  This test  is not yet approved or  cleared by the [place] and has been authorized for detection and/or diagnosis of SARS-CoV-2 by FDA under an Emergency Use Authorization (EUA).  This EUA will remain in effect (meaning this test can be used) for the duration of the COVID-19 declaration under Section 564(b)(1) of the Act, 21 U.S.C. section 360bbb-3(b)(1), unless the authorization is terminated or revoked sooner.  Performed at [facility], [address] [place], [place] [address]      Scheduled Meds: . aspirin EC  81 mg Oral Q breakfast  . cholecalciferol   1,000 Units Oral Daily  . cloNIDine  0.1 mg Oral BID  . diclofenac sodium  2 g Topical QID  . enoxaparin (LOVENOX) injection  30 mg Subcutaneous Q24H  . fish oil-omega-3 fatty acids  1 g Oral TID  . furosemide  80 mg Intravenous BID  . hydrALAZINE  100 mg Oral TID  . isosorbide mononitrate  30 mg Oral Daily  . NIFEdipine  90 mg Oral Daily  . pantoprazole  40 mg Oral Daily  . pravastatin  40 mg Oral QHS  . sodium bicarbonate  650 mg Oral TID  . sucralfate  1 g Oral QID   Continuous Infusions:  Procedures/Studies: DG Chest Port 1 View  Result Date: 10/15/2019 CLINICAL DATA:  Short of breath, hypoxia, tobacco abuse EXAM: PORTABLE CHEST 1 VIEW COMPARISON:  07/28/2019 FINDINGS: 2 frontal views of the chest demonstrate a stable cardiac silhouette. Diffuse interstitial prominence is unchanged, likely representing scarring and fibrosis. No acute airspace disease, effusion, or pneumothorax. No acute bony abnormalities. IMPRESSION: 1. Stable interstitial prominence consistent with scarring and fibrosis. 2. No acute airspace disease. Electronically Signed   By: Randa Ngo M.D.   On: 10/15/2019 23:39    Orson Eva, DO  [facility]  If 7PM-7AM, please contact night-coverage [email or web address] Password [facility] 10/16/2019, 6:57 AM   LOS: 0 days

## 2019-10-16 NOTE — Plan of Care (Signed)

## 2019-10-16 NOTE — TOC Initial Note (Signed)
Transition of Care [facility]) - Initial/Assessment Note   Patient Details  Name: [name] MRN: [id number] Date of Birth: 10-29-1965  Transition of Care [facility]) CM/SW Contact:    Sherie Don, LCSW Phone Number: 10/16/2019, 9:56 AM  Clinical Narrative: Patient is a 54 year old male admitted for volume overload. Patient is currently a resident of [place]. TOC received consult for CHF screening. CSW met with patient in ED to complete assessment. Per patient, his only DME is home O2 through [employer] and he does not have a history of receiving [place] services. Patient reported he is mostly independent with his ADLs and takes his medications as prescribed.  Per CHF screening, patient reported he does not follow a heart healthy diet and does not restrict his salt or fluid intake. Patient also reported he does not weigh himself daily to monitor fluid retention. TOC to follow.  Expected Discharge Plan: Rest Home Barriers to Discharge: Continued Medical Work up  Expected Discharge Plan and Services Expected Discharge Plan: Rest Home In-house Referral: Clinical Social Work Discharge Planning Services: NA Living arrangements for the past 2 months: Group Home ([place])  Prior Living Arrangements/Services Living arrangements for the past 2 months: [place] ([place]) Lives with:: Facility Resident Patient language and need for interpreter reviewed:: Yes Do you feel safe going back to the place where you live?: Yes      Need for Family Participation in Patient Care: Yes (Comment) Care giver support system in place?: Yes (comment) ([place]) Current home services: DME (Home O2 through [employer]) Criminal Activity/Legal Involvement Pertinent to Current Situation/Hospitalization: No - Comment as needed  Permission Sought/Granted Permission sought to share information with : Facility Sport and exercise psychologist Share Information with NAME: De Blanch Permission granted to share info w AGENCY: [facility] Permission granted to share info w Relationship: Facility owner Permission granted to share info w Contact Information: [phone]  Emotional Assessment Appearance:: Appears stated age Attitude/Demeanor/Rapport: Engaged Affect (typically observed): Accepting Orientation: : Oriented to Self, Oriented to Place, Oriented to  Time, Oriented to Situation Alcohol / Substance Use: Tobacco Use Psych Involvement: Yes (comment) (Patient has schizoaffective DO, bipolar type)  Admission diagnosis:  Volume overload [E87.70] Acute renal failure superimposed on stage 5 chronic kidney disease, not on chronic dialysis, unspecified acute renal failure type ([place]) [N17.9, N18.5] Patient Active Problem List   Diagnosis Date Noted  . Volume overload 10/16/2019  . Acute on chronic respiratory failure with hypoxia ([place]) 10/16/2019  . Acute renal failure superimposed on stage 5 chronic kidney disease, not on chronic dialysis ([place]) 10/16/2019  . Acute hypoxemic respiratory failure ([place]) 07/28/2019  . Elevated troponin 07/26/2019  . Vomiting 10/02/2018  . Acute on chronic renal failure ([place]) 02/06/2018  . Schizoaffective disorder, bipolar type ([place]) 01/04/2016  . Acute renal failure ([place]) 12/17/2012  . Dehydration 12/17/2012  . Diarrhea 12/17/2012  . Gastroenteritis 12/17/2012  . GERD (gastroesophageal reflux disease) 12/17/2012  . Metabolic acidosis 03/50/0938  . Chronic kidney disease, stage 2, mildly decreased GFR 03/19/2012  . CAD (coronary artery disease)   . Schizophrenia ([place])   . Tobacco abuse   . Diabetes mellitus, type 2 ([place])   . Hypertension   . Hyperlipidemia   . Aortic insufficiency    PCP:  Rosita Fire, MD Pharmacy:   [facility], [place] [address] [place] [address] Phone: [phone] Fax: [phone]  Readmission Risk Interventions  No flowsheet data found.

## 2019-10-16 NOTE — H&P (Signed)
History and Physical    [name] QKM:[id number] DOB: [birth year] DOA: 10/15/2019  PCP: Rosita Fire, MD   Patient coming from: Home.   I have personally briefly reviewed patient's old medical records in [place]  Chief Complaint: Shortness of breath.  HPI: [name] is a 54 y.o. male with medical history significant of aortic insufficiency, atherosclerotic cardiovascular disease, axillary abscess, cerebrovascular disease, COPD, type 2 diabetes, hyperlipidemia, hypertension, mild anemia, obesity, schizophrenia, sinus bradycardia, tobacco abuse who is coming to the emergency department with progressively worse dyspnea for several days associated with orthopnea, lower extremity edema fatigue, cough with clear whitish productive sputum, but denies chest pain, palpitations, dizziness, diaphoresis, PND, abdominal pain, nausea, vomiting, diarrhea, constipation, melena or hematochezia.  No dysuria, frequency or materia no polyuria, polydipsia, polyphagia or blurred vision.  ED Course: Initial vital signs were temperature Pulse 85, respirations 24, blood pressure 159/67 mmHg and O2 sat 94% on BiPAP ventilation.  He was given furosemide 100 mg IVP and Nitropaste in the emergency department.  CBC had a white count of 10.7, hemoglobin 8.4 g/dL and platelets 172.  BNP was 330.0 pg/mL.  EKG was sinus rhythm with LVH, borderline prolonged QT interval.  Troponin I 167 and then 171 ng/L.  CMP showed sodium 134 mmol/L.  All other electrolytes are within normal limits when calcium is corrected to albumin.  Glucose 154, BUN 47, creatinine 5.58 mg/dL.  LFTs are within normal limits, except for an albumin of 3.4 g/dL.  SARS coronavirus 2 was negative.  Chest radiograph did not show any acute airspace disease.  Review of Systems: As per HPI otherwise all other systems reviewed and are negative.  Past Medical History:  Diagnosis Date  . Aortic insufficiency    mild to moderate   .  Arteriosclerotic cardiovascular disease (ASCVD)    EF 60% -- Non-Q MI in 07/2008->  BMS to mid LAD  . Axillary abscess 2011   right  . Cerebrovascular disease    CVA  . Chronic obstructive pulmonary disease ([place])   . Diabetes mellitus type II   . Diabetes mellitus, type 2 (HCC)    A1c of 5.6 in 05/2011  . Hyperlipidemia   . Hypertension   . Mild anemia    Result.  Nl H&H in 02/2011  . Obesity   . Schizophrenia ([place])    Schizophrenia/bipolar disease/mental retardation  . Sinus bradycardia    On low dose beta blocker  . Tobacco abuse    15 pack years; 0.5 pack per day   Past Surgical History:  Procedure Laterality Date  . [place]   Social History  reports that he has been smoking cigarettes. He started smoking about 44 years ago. He has a 30.00 pack-year smoking history. He has never used smokeless tobacco. He reports that he does not drink alcohol and does not use drugs.  No Known Allergies  Family History  Problem Relation Age of Onset  . Hypertension Other   . Arthritis Other    Prior to Admission medications   Medication Sig Start Date End Date Taking? Authorizing Provider  acetaminophen (TYLENOL) 500 MG tablet Take 1 tablet (500 mg total) by mouth every 6 (six) hours as needed. 01/11/16   [name], PA-C  albuterol (VENTOLIN HFA) 108 (90 Base) MCG/ACT inhaler Inhale 2 puffs into the lungs every 6 (six) hours as needed for wheezing or shortness of breath. 07/29/19   Manuella Ghazi, Pratik D, DO  aspirin EC 81 MG  tablet Take 1 tablet (81 mg total) by mouth daily with breakfast. 07/26/19   Roxan Hockey, MD  carvedilol (COREG) 6.25 MG tablet Take 1 tablet (6.25 mg total) by mouth 2 (two) times daily. 07/26/19   Roxan Hockey, MD  cholecalciferol (VITAMIN D3) 25 MCG (1000 UNIT) tablet Take 1,000 Units by mouth daily.    [provider]  cloNIDine (CATAPRES) 0.1 MG tablet Take 1 tablet (0.1 mg total) by mouth 2 (two) times daily. 07/26/19   Roxan Hockey, MD    diclofenac sodium (VOLTAREN) 1 % GEL Apply 2 g topically 4 (four) times daily.    [provider]  fish oil-omega-3 fatty acids 1000 MG capsule Take 1 g by mouth in the morning, at noon, and at bedtime.     [provider]  furosemide (LASIX) 40 MG tablet Take 1 tablet (40 mg total) by mouth daily. For Fluid Patient taking differently: Take 40 mg by mouth daily.  07/26/19   Roxan Hockey, MD  haloperidol decanoate (HALDOL DECANOATE) 100 MG/ML injection Inject 100 mg into the muscle every 14 (fourteen) days.      [provider]  hydrALAZINE (APRESOLINE) 100 MG tablet Take 1 tablet (100 mg total) by mouth 3 (three) times daily. 07/26/19   Roxan Hockey, MD  insulin glargine (LANTUS) 100 UNIT/ML injection Inject 20 Units into the skin See admin instructions. Twice daily For blood sugar levels over 100. Do not administer for blood sugar levels lower than 100    [provider]  isosorbide mononitrate (IMDUR) 30 MG 24 hr tablet Take 1 tablet (30 mg total) by mouth daily. 07/26/19 07/25/20  Roxan Hockey, MD  loperamide (QC ANTI-DIARRHEAL) 2 MG tablet Take 2 mg by mouth 4 (four) times daily as needed for diarrhea or loose stools.    [provider]  Multiple Vitamin (DAILY VITE PO) Take 1 tablet by mouth every morning.     [provider]  NIFEdipine (ADALAT CC) 90 MG 24 hr tablet Take 1 tablet (90 mg total) by mouth daily. 07/26/19   Roxan Hockey, MD  nitroGLYCERIN (NITROSTAT) 0.4 MG SL tablet Place 0.4 mg under the tongue every 5 (five) minutes as needed for chest pain.    [provider]  omeprazole (PRILOSEC) 20 MG capsule Take 1 capsule (20 mg total) by mouth daily. 07/25/19   Henderly, Britni A, PA-C  potassium chloride (KLOR-CON) 10 MEQ tablet TAKE (1) TABLET BY MOUTH ONCE DAILY. Patient taking differently: Take 10 mEq by mouth daily.  07/26/19   Roxan Hockey, MD  pravastatin (PRAVACHOL) 40 MG tablet Take 1 tablet (40 mg  total) by mouth at bedtime. 07/26/19   Roxan Hockey, MD  sodium bicarbonate 650 MG tablet Take 650 mg by mouth 3 (three) times daily.    [provider]  sucralfate (CARAFATE) 1 GM/10ML suspension Take 10 mLs (1 g total) by mouth 4 (four) times daily -  with meals and at bedtime. Patient taking differently: Take 1 g by mouth 4 (four) times daily.  07/25/19   Henderly, Britni A, PA-C  traZODone (DESYREL) 50 MG tablet Take 50 mg by mouth at bedtime.    [provider]   Physical Exam: Vitals:   10/16/19 0241 10/16/19 0300 10/16/19 0400 10/16/19 0500  BP:  (!) 147/70 (!) 143/70 (!) 144/69  Pulse:  76 75 75  Resp:  $Remo'17 18 16  'cQnov$ SpO2: 98% 98% 100% 98%  Weight:      Height:  Constitutional: NAD, calm, comfortable Eyes: PERRL, lids and conjunctivae mildly injected. ENMT: BiPAP mask on.  Mucous membranes are moist. Posterior pharynx clear of any exudate or lesions. Neck: normal, supple, no masses, no thyromegaly Respiratory: Bibasilar crackles. Normal respiratory effort. No accessory muscle use.  Cardiovascular: Regular rate and rhythm, no murmurs / rubs / gallops.  3+ bilateral lower extremity pitting edema. 2+ pedal pulses. No carotid bruits.  Abdomen: no tenderness, no masses palpated. No hepatosplenomegaly. Bowel sounds positive.  Musculoskeletal: no clubbing / cyanosis. Good ROM, no contractures. Normal muscle tone.  Skin: no rashes, lesions, ulcers on limited dermatological examination. Neurologic: CN 2-12 grossly intact. Sensation intact, DTR normal. Strength 5/5 in all 4.  Psychiatric: Normal judgment and insight. Alert and oriented x 3. Normal mood.   Labs on Admission: I have personally reviewed following labs and imaging studies  CBC: Recent Labs  Lab 10/15/19 2327  WBC 10.7*  NEUTROABS 9.4*  HGB 8.4*  HCT 26.5*  MCV 84.1  PLT 528   Basic Metabolic Panel: Recent Labs  Lab 10/15/19 2327  NA 134*  K 4.0  CL 100  CO2 24  GLUCOSE 154*  BUN 47*    CREATININE 5.58*  CALCIUM 8.5*   GFR: Estimated Creatinine Clearance: 19.3 mL/min (A) (by C-G formula based on SCr of 5.58 mg/dL (H)).  Liver Function Tests: Recent Labs  Lab 10/15/19 2327  AST 16  ALT 15  ALKPHOS 109  BILITOT 0.6  PROT 7.2  ALBUMIN 3.4*   Radiological Exams on Admission: DG Chest Port 1 View  Result Date: 10/15/2019 CLINICAL DATA:  Short of breath, hypoxia, tobacco abuse EXAM: PORTABLE CHEST 1 VIEW COMPARISON:  07/28/2019 FINDINGS: 2 frontal views of the chest demonstrate a stable cardiac silhouette. Diffuse interstitial prominence is unchanged, likely representing scarring and fibrosis. No acute airspace disease, effusion, or pneumothorax. No acute bony abnormalities. IMPRESSION: 1. Stable interstitial prominence consistent with scarring and fibrosis. 2. No acute airspace disease. Electronically Signed   By: Randa Ngo M.D.   On: 10/15/2019 23:39   07/26/2019 echocardiogram  IMPRESSIONS   1. Left ventricular ejection fraction, by estimation, is 60 to 65%. The  left ventricle has normal function. The left ventricle has no regional  wall motion abnormalities. There is severe left ventricular hypertrophy.  Left ventricular diastolic parameters  are indeterminate.  2. Right ventricular systolic function is normal. The right ventricular  size is normal.  3. The mitral valve is abnormal. No evidence of mitral valve  regurgitation. No evidence of mitral stenosis.  4. The aortic valve has an indeterminant number of cusps. Aortic valve  regurgitation is moderate. Mild to moderate aortic valve stenosis.  5. The inferior vena cava is normal in size with greater than 50%  respiratory variability, suggesting right atrial pressure of 3 mmHg.   EKG: Independently reviewed.  Vent. rate 91 BPM PR interval * ms QRS duration 101 ms QT/QTc 388/478 ms P-R-T axes 72 21 162 Sinus rhythm LVH with secondary repolarization abnormality Borderline prolonged QT  interval  Assessment/Plan Principal Problem:   Volume overload Observation/stepdown. Continue supplemental oxygen. Continue BiPAP vent mode. Continue furosemide 80 mg p.o. twice daily. Monitor daily weights, input and output. Monitor renal function electrolytes.  Active Problems:   Hypertension Hold carvedilol. Continue hydralazine 100 mg p.o. 3 times daily. Continue nifedipine 90 mg p.o. daily. Monitor blood pressure and heart rate.    Hyperlipidemia Continue pravastatin.    CAD (coronary artery disease) Continue aspirin and pravastatin. Hold beta-blocker due  to volume overload.    Diabetes mellitus, type 2 (HCC) Carbohydrate modified diet. Hold Lantus for now. CBG monitoring with R ISS.    GERD (gastroesophageal reflux disease)   Acute on chronic renal failure (HCC) Follow renal function electrolytes. Consider nephrology evaluation.    Schizoaffective disorder, bipolar type ([place]) On monthly haloperidol. Follow with behavioral health as scheduled.    Elevated troponin Secondary to demand ischemia. Level has stabilized.    Acute on chronic renal failure Continue sodium bicarb. Monitor renal function electrolytes. Consider nephrology consult.   DVT prophylaxis: Lovenox SQ. Code Status:   Full code. Family Communication:   Disposition Plan:   Patient is from:  Home.  Anticipated DC to:  Home.  Anticipated DC date:  10/17/2019.  Anticipated DC barriers: Clinical improvement.  Consults called:   Admission status:  Observation/stepdown.  Severity of Illness:  Very high.  Reubin Milan MD [facility]  How to contact the [facility] Attending or Consulting provider Parkline or covering provider during after hours [place], for this patient?   1. Check the care team in [facility] and look for a) attending/consulting [facility] provider listed and b) the [facility] team listed 2. Log into [email or web address] and use [place] universal password to access. If you do not have the  password, please contact the hospital operator. 3. Locate the [facility] provider you are looking for under [facility] and page to a number that you can be directly reached. 4. If you still have difficulty reaching the provider, please page the [facility] (Director on Call) for the Hospitalists listed on amion for assistance.  10/16/2019, 5:24 AM   This document was prepared using Dragon voice recognition software and may contain some unintended transcription errors.

## 2019-10-16 NOTE — ED Notes (Signed)
CRITICAL VALUE ALERT  Critical Value:  Trop 167  Date & Time Notied:  10/16/2019 0016  Provider Notified: Dr. Dayna Barker  Orders Received/Actions taken: see chart

## 2019-10-16 NOTE — ED Provider Notes (Signed)
[facility] EMERGENCY DEPARTMENT Provider Note   CSN: [id number] Arrival date & time: 10/15/19  2307     History Chief Complaint  Patient presents with  . Shortness of Breath    [name] is a 54 y.o. male.   Shortness of Breath Severity:  Severe Onset quality:  Gradual Timing:  Constant Progression:  Worsening Chronicity:  New Context: not activity   Relieved by:  None tried Worsened by:  Nothing Ineffective treatments:  None tried Associated symptoms: no abdominal pain, no fever and no headaches   Risk factors: no recent alcohol use        Past Medical History:  Diagnosis Date  . Aortic insufficiency    mild to moderate   . Arteriosclerotic cardiovascular disease (ASCVD)    EF 60% -- Non-Q MI in 07/2008->  BMS to mid LAD  . Axillary abscess 2011   right  . Cerebrovascular disease    CVA  . Chronic obstructive pulmonary disease ([place])   . Diabetes mellitus type II   . Diabetes mellitus, type 2 (HCC)    A1c of 5.6 in 05/2011  . Hyperlipidemia   . Hypertension   . Mild anemia    Result.  Nl H&H in 02/2011  . Obesity   . Schizophrenia ([place])    Schizophrenia/bipolar disease/mental retardation  . Sinus bradycardia    On low dose beta blocker  . Tobacco abuse    15 pack years; 0.5 pack per day    Patient Active Problem List   Diagnosis Date Noted  . Volume overload 10/16/2019  . Acute hypoxemic respiratory failure ([place]) 07/28/2019  . Elevated troponin 07/26/2019  . Vomiting 10/02/2018  . Acute on chronic renal failure ([place]) 02/06/2018  . Schizoaffective disorder, bipolar type ([place]) 01/04/2016  . Acute renal failure ([place]) 12/17/2012  . Dehydration 12/17/2012  . Diarrhea 12/17/2012  . Gastroenteritis 12/17/2012  . GERD (gastroesophageal reflux disease) 12/17/2012  . Metabolic acidosis 10/13/1973  . Chronic kidney disease, stage 2, mildly decreased GFR 03/19/2012  . CAD (coronary artery disease)   . Schizophrenia ([place])   . Tobacco abuse   .  Diabetes mellitus, type 2 ([place])   . Hypertension   . Hyperlipidemia   . Aortic insufficiency     Past Surgical History:  Procedure Laterality Date  . HERNIA REPAIR  1967       Family History  Problem Relation Age of Onset  . Hypertension Other   . Arthritis Other     Social History   Tobacco Use  . Smoking status: Current Every Day Smoker    Packs/day: 1.00    Years: 30.00    Pack years: 30.00    Types: Cigarettes    Start date: 12/03/1974  . Smokeless tobacco: Never Used  . Tobacco comment: Patient is not motivated to quit.  He attributes his smoking to the lack of other activities.  Vaping Use  . Vaping Use: Never used  Substance Use Topics  . Alcohol use: No    Alcohol/week: 0.0 standard drinks  . Drug use: No    Home Medications Prior to Admission medications   Medication Sig Start Date End Date Taking? Authorizing Provider  acetaminophen (TYLENOL) 500 MG tablet Take 1 tablet (500 mg total) by mouth every 6 (six) hours as needed. 01/11/16   [name], PA-C  albuterol (VENTOLIN HFA) 108 (90 Base) MCG/ACT inhaler Inhale 2 puffs into the lungs every 6 (six) hours as needed for wheezing or shortness of breath.  07/29/19   Manuella Ghazi, Pratik D, DO  aspirin EC 81 MG tablet Take 1 tablet (81 mg total) by mouth daily with breakfast. 07/26/19   Roxan Hockey, MD  carvedilol (COREG) 6.25 MG tablet Take 1 tablet (6.25 mg total) by mouth 2 (two) times daily. 07/26/19   Roxan Hockey, MD  cholecalciferol (VITAMIN D3) 25 MCG (1000 UNIT) tablet Take 1,000 Units by mouth daily.    [provider]  cloNIDine (CATAPRES) 0.1 MG tablet Take 1 tablet (0.1 mg total) by mouth 2 (two) times daily. 07/26/19   Roxan Hockey, MD  diclofenac sodium (VOLTAREN) 1 % GEL Apply 2 g topically 4 (four) times daily.    [provider]  fish oil-omega-3 fatty acids 1000 MG capsule Take 1 g by mouth in the morning, at noon, and at bedtime.     [provider]  furosemide  (LASIX) 40 MG tablet Take 1 tablet (40 mg total) by mouth daily. For Fluid Patient taking differently: Take 40 mg by mouth daily.  07/26/19   Roxan Hockey, MD  haloperidol decanoate (HALDOL DECANOATE) 100 MG/ML injection Inject 100 mg into the muscle every 14 (fourteen) days.      [provider]  hydrALAZINE (APRESOLINE) 100 MG tablet Take 1 tablet (100 mg total) by mouth 3 (three) times daily. 07/26/19   Roxan Hockey, MD  insulin glargine (LANTUS) 100 UNIT/ML injection Inject 20 Units into the skin See admin instructions. Twice daily For blood sugar levels over 100. Do not administer for blood sugar levels lower than 100    [provider]  isosorbide mononitrate (IMDUR) 30 MG 24 hr tablet Take 1 tablet (30 mg total) by mouth daily. 07/26/19 07/25/20  Roxan Hockey, MD  loperamide (QC ANTI-DIARRHEAL) 2 MG tablet Take 2 mg by mouth 4 (four) times daily as needed for diarrhea or loose stools.    [provider]  Multiple Vitamin (DAILY VITE PO) Take 1 tablet by mouth every morning.     [provider]  NIFEdipine (ADALAT CC) 90 MG 24 hr tablet Take 1 tablet (90 mg total) by mouth daily. 07/26/19   Roxan Hockey, MD  nitroGLYCERIN (NITROSTAT) 0.4 MG SL tablet Place 0.4 mg under the tongue every 5 (five) minutes as needed for chest pain.    [provider]  omeprazole (PRILOSEC) 20 MG capsule Take 1 capsule (20 mg total) by mouth daily. 07/25/19   Henderly, Britni A, PA-C  potassium chloride (KLOR-CON) 10 MEQ tablet TAKE (1) TABLET BY MOUTH ONCE DAILY. Patient taking differently: Take 10 mEq by mouth daily.  07/26/19   Roxan Hockey, MD  pravastatin (PRAVACHOL) 40 MG tablet Take 1 tablet (40 mg total) by mouth at bedtime. 07/26/19   Roxan Hockey, MD  sodium bicarbonate 650 MG tablet Take 650 mg by mouth 3 (three) times daily.    [provider]  sucralfate (CARAFATE) 1 GM/10ML suspension Take 10 mLs (1 g total) by mouth 4 (four) times  daily -  with meals and at bedtime. Patient taking differently: Take 1 g by mouth 4 (four) times daily.  07/25/19   Henderly, Britni A, PA-C  traZODone (DESYREL) 50 MG tablet Take 50 mg by mouth at bedtime.    [provider]    Allergies    Patient has no known allergies.  Review of Systems   Review of Systems  Constitutional: Negative for fever.  Respiratory: Positive for shortness of breath.   Gastrointestinal: Negative for abdominal pain.  Neurological: Negative  for headaches.  All other systems reviewed and are negative.   Physical Exam Updated Vital Signs BP 111/69   Pulse 75   Resp 17   Ht 6' (1.829 m)   Wt 111.6 kg   SpO2 98%   BMI 33.36 kg/m   Physical Exam Vitals and nursing note reviewed.  Constitutional:      Appearance: He is well-developed.  HENT:     Head: Normocephalic and atraumatic.     Nose: No congestion or rhinorrhea.     Mouth/Throat:     Mouth: Mucous membranes are moist.     Pharynx: Oropharynx is clear.  Eyes:     Pupils: Pupils are equal, round, and reactive to light.  Cardiovascular:     Rate and Rhythm: Normal rate.  Pulmonary:     Effort: Tachypnea, accessory muscle usage and respiratory distress present.     Breath sounds: Decreased breath sounds and rales present.  Chest:     Chest wall: No mass or deformity.  Abdominal:     General: There is no distension.  Musculoskeletal:        General: Normal range of motion.     Cervical back: Normal range of motion.  Skin:    General: Skin is warm and dry.  Neurological:     Mental Status: He is alert.     Cranial Nerves: No cranial nerve deficit.     Motor: No weakness.     ED Results / Procedures / Treatments   Labs (all labs ordered are listed, but only abnormal results are displayed) Labs Reviewed  CBC WITH DIFFERENTIAL/PLATELET - Abnormal; Notable for the following components:      Result Value   WBC 10.7 (*)    RBC 3.15 (*)    Hemoglobin 8.4 (*)    HCT 26.5 (*)      RDW 16.3 (*)    Neutro Abs 9.4 (*)    Lymphs Abs 0.5 (*)    Abs Immature Granulocytes 0.11 (*)    All other components within normal limits  COMPREHENSIVE METABOLIC PANEL - Abnormal; Notable for the following components:   Sodium 134 (*)    Glucose, Bld 154 (*)    BUN 47 (*)    Creatinine, Ser 5.58 (*)    Calcium 8.5 (*)    Albumin 3.4 (*)    GFR calc non Af Amer 11 (*)    GFR calc Af Amer 12 (*)    All other components within normal limits  BRAIN NATRIURETIC PEPTIDE - Abnormal; Notable for the following components:   B Natriuretic Peptide 330.0 (*)    All other components within normal limits  TROPONIN I (HIGH SENSITIVITY) - Abnormal; Notable for the following components:   Troponin I (High Sensitivity) 167 (*)    All other components within normal limits  TROPONIN I (HIGH SENSITIVITY) - Abnormal; Notable for the following components:   Troponin I (High Sensitivity) 171 (*)    All other components within normal limits  SARS CORONAVIRUS 2 BY RT PCR [facility] ORDER, [place] LAB)    EKG EKG Interpretation  Date/Time:  Tuesday October 15 2019 23:10:59 EDT Ventricular Rate:  91 PR Interval:    QRS Duration: 101 QT Interval:  388 QTC Calculation: 478 R Axis:   21 Text Interpretation: Sinus rhythm LVH with secondary repolarization abnormality Borderline prolonged QT interval No significant change since last tracing Confirmed by Merrily Pew [phone]) on 10/15/2019 11:44:20 PM   Radiology DG  Chest Port 1 View  Result Date: 10/15/2019 CLINICAL DATA:  Short of breath, hypoxia, tobacco abuse EXAM: PORTABLE CHEST 1 VIEW COMPARISON:  07/28/2019 FINDINGS: 2 frontal views of the chest demonstrate a stable cardiac silhouette. Diffuse interstitial prominence is unchanged, likely representing scarring and fibrosis. No acute airspace disease, effusion, or pneumothorax. No acute bony abnormalities. IMPRESSION: 1. Stable interstitial prominence consistent with scarring  and fibrosis. 2. No acute airspace disease. Electronically Signed   By: Randa Ngo M.D.   On: 10/15/2019 23:39    Procedures .Critical Care Performed by: Merrily Pew, MD Authorized by: Merrily Pew, MD   Critical care provider statement:    Critical care time (minutes):  45   Critical care was necessary to treat or prevent imminent or life-threatening deterioration of the following conditions:  Respiratory failure   Critical care was time spent personally by me on the following activities:  Discussions with consultants, evaluation of patient's response to treatment, examination of patient, ordering and performing treatments and interventions, ordering and review of laboratory studies, ordering and review of radiographic studies, pulse oximetry, re-evaluation of patient's condition, obtaining history from patient or surrogate and review of old charts   (including critical care time)  Medications Ordered in ED Medications  enoxaparin (LOVENOX) injection 30 mg (has no administration in time range)  acetaminophen (TYLENOL) tablet 650 mg (has no administration in time range)    Or  acetaminophen (TYLENOL) suppository 650 mg (has no administration in time range)  ondansetron (ZOFRAN) tablet 4 mg (has no administration in time range)    Or  ondansetron (ZOFRAN) injection 4 mg (has no administration in time range)  albuterol (PROVENTIL) (2.5 MG/3ML) 0.083% nebulizer solution 5 mg (5 mg Nebulization Given 10/15/19 2339)  furosemide (LASIX) 120 mg in dextrose 5 % 50 mL IVPB ( Intravenous Stopped 10/16/19 0257)  nitroGLYCERIN (NITROGLYN) 2 % ointment 1 inch (1 inch Topical Given 10/16/19 0244)    ED Course  I have reviewed the triage vital signs and the nursing notes.  Pertinent labs & imaging results that were available during my care of the patient were reviewed by me and considered in my medical decision making (see chart for details).    MDM Rules/Calculators/A&P                           resp distress. H/o copd but with rales and le edema, hypertension and elevated BNP/abnormal cxr, I suspect this is more CHF. bipap started. NTG started. Lasix given. Will admit.   Final Clinical Impression(s) / ED Diagnoses Final diagnoses:  Hypoxia  Shortness of breath    Rx / DC Orders ED Discharge Orders    None       Ion Gonnella, Corene Cornea, MD 10/16/19 [phone]

## 2019-10-16 NOTE — ED Notes (Signed)
Spoke with R/T re: removing bi pap to see how patient responds.  Pt placed on 4liters o2 via [place]. resp even and non labored at this time. Will continue to monitor.

## 2019-10-16 NOTE — Consult Note (Signed)
Reason for Consult: AKI/CKD stage IV Referring Physician: Tat, MD  [name] is an 53 y.o. male with a PMH significant for COPD, HTN, schizoaffective disorder, HLD, depression, DM type 2, tobacco abuse, CAD, and CKD stage IV who presented to [facility] ED with a 1 week history of worsening SOB, orthopnea, lower extremity edema, and increasing abdominal girth.  In the ED he was found to have hypoxia and was placed on BiPap.  CXR revealed stable interstitial prominence consistent with scarring and fibrosis, without acute airspace disease.  He was admitted and started on IV Lasix.  We were consulted due to the development of AKI/CKD stage IV.  The trend in Scr is seen below.  Of note, he is followed by Dr. Theador Hawthorne who feels that his progressive CKD is due to DM and HTN and was seen on 10/09/19 and was to have vascular access placement, however his caregiver cancelled the appointment.  His Scr was 4.45 on 10/02/19 and Hgb 11.9.  Trend in Creatinine: Creatinine, Ser  Date/Time Value Ref Range Status  10/16/2019 08:13 AM 5.90 (H) 0.61 - 1.24 mg/dL Final  10/15/2019 11:27 PM 5.58 (H) 0.61 - 1.24 mg/dL Final  07/29/2019 04:13 AM 4.78 (H) 0.61 - 1.24 mg/dL Final  07/28/2019 03:21 AM 4.58 (H) 0.61 - 1.24 mg/dL Final  07/26/2019 04:49 AM 4.20 (H) 0.61 - 1.24 mg/dL Final  07/25/2019 07:43 PM 4.24 (H) 0.61 - 1.24 mg/dL Final  02/08/2018 05:30 AM 2.55 (H) 0.61 - 1.24 mg/dL Final  02/07/2018 05:16 AM 2.80 (H) 0.61 - 1.24 mg/dL Final  02/06/2018 08:16 PM 3.60 (H) 0.61 - 1.24 mg/dL Final  02/06/2018 03:43 PM 3.40 (H) 0.61 - 1.24 mg/dL Final  12/26/2017 09:12 AM 1.98 (H) 0.61 - 1.24 mg/dL Final  12/20/2016 10:24 AM 1.63 (H) 0.76 - 1.27 mg/dL Final  06/07/2016 04:40 PM 1.34 (H) 0.61 - 1.24 mg/dL Final  06/11/2013 11:37 AM 1.92 (H) 0.50 - 1.35 mg/dL Final  12/20/2012 04:48 AM 1.77 (H) 0.50 - 1.35 mg/dL Final  12/19/2012 04:48 AM 2.33 (H) 0.50 - 1.35 mg/dL Final  12/18/2012 02:41 AM 4.25 (H) 0.50 - 1.35 mg/dL  Final  12/17/2012 02:18 PM 6.01 (H) 0.50 - 1.35 mg/dL Final  03/02/2011 02:35 PM 1.28 0.50 - 1.35 mg/dL Final  01/29/2011 08:05 PM 1.24 0.50 - 1.35 mg/dL Final  11/11/2009 06:12 AM 1.10 0.40 - 1.50 mg/dL Final  11/09/2009 10:34 AM 1.41 0.40 - 1.50 mg/dL Final  05/15/2009 05:14 AM 1.70 (H) 0.40 - 1.50 mg/dL Final  05/14/2009 03:40 AM 2.55 (H) 0.40 - 1.50 mg/dL Final  05/13/2009 11:04 AM 2.83 (H) 0.40 - 1.50 mg/dL Final  07/29/2008 03:27 AM 1.11 0.40 - 1.50 mg/dL Final  07/28/2008 09:00 AM 1.03 0.40 - 1.50 mg/dL Final  07/28/2008 01:19 AM 1.35 0.40 - 1.50 mg/dL Final  12/27/2006 05:30 AM 1.09  Final  12/25/2006 05:55 AM 1.41  Final  12/23/2006 05:55 PM 1.74 (H)  Final    PMH:   Past Medical History:  Diagnosis Date  . Aortic insufficiency    mild to moderate   . Arteriosclerotic cardiovascular disease (ASCVD)    EF 60% -- Non-Q MI in 07/2008->  BMS to mid LAD  . Axillary abscess 2011   right  . Cerebrovascular disease    CVA  . Chronic obstructive pulmonary disease ([place])   . Diabetes mellitus type II   . Diabetes mellitus, type 2 (HCC)    A1c of 5.6 in 05/2011  . Hyperlipidemia   .  Hypertension   . Mild anemia    Result.  Nl H&H in 02/2011  . Obesity   . Schizophrenia (HCC)    Schizophrenia/bipolar disease/mental retardation  . Sinus bradycardia    On low dose beta blocker  . Tobacco abuse    15 pack years; 0.5 pack per day    PSH:   Past Surgical History:  Procedure Laterality Date  . HERNIA REPAIR  1967    Allergies: No Known Allergies  Medications:   Prior to Admission medications   Medication Sig Start Date End Date Taking? Authorizing Provider  acetaminophen (TYLENOL) 500 MG tablet Take 1 tablet (500 mg total) by mouth every 6 (six) hours as needed. 01/11/16   Burgess Amor, PA-C  albuterol (VENTOLIN HFA) 108 (90 Base) MCG/ACT inhaler Inhale 2 puffs into the lungs every 6 (six) hours as needed for wheezing or shortness of breath. 07/29/19   Sherryll Burger, Pratik D, DO   aspirin EC 81 MG tablet Take 1 tablet (81 mg total) by mouth daily with breakfast. 07/26/19   Shon Hale, MD  carvedilol (COREG) 6.25 MG tablet Take 1 tablet (6.25 mg total) by mouth 2 (two) times daily. 07/26/19   Shon Hale, MD  cholecalciferol (VITAMIN D3) 25 MCG (1000 UNIT) tablet Take 1,000 Units by mouth daily.    [provider]  cloNIDine (CATAPRES) 0.1 MG tablet Take 1 tablet (0.1 mg total) by mouth 2 (two) times daily. 07/26/19   Shon Hale, MD  diclofenac sodium (VOLTAREN) 1 % GEL Apply 2 g topically 4 (four) times daily.    [provider]  fish oil-omega-3 fatty acids 1000 MG capsule Take 1 g by mouth in the morning, at noon, and at bedtime.     [provider]  furosemide (LASIX) 40 MG tablet Take 1 tablet (40 mg total) by mouth daily. For Fluid Patient taking differently: Take 40 mg by mouth daily.  07/26/19   Shon Hale, MD  haloperidol decanoate (HALDOL DECANOATE) 100 MG/ML injection Inject 100 mg into the muscle every 14 (fourteen) days.      [provider]  hydrALAZINE (APRESOLINE) 100 MG tablet Take 1 tablet (100 mg total) by mouth 3 (three) times daily. 07/26/19   Shon Hale, MD  insulin glargine (LANTUS) 100 UNIT/ML injection Inject 20 Units into the skin See admin instructions. Twice daily For blood sugar levels over 100. Do not administer for blood sugar levels lower than 100    [provider]  isosorbide mononitrate (IMDUR) 30 MG 24 hr tablet Take 1 tablet (30 mg total) by mouth daily. 07/26/19 07/25/20  Shon Hale, MD  loperamide (QC ANTI-DIARRHEAL) 2 MG tablet Take 2 mg by mouth 4 (four) times daily as needed for diarrhea or loose stools.    [provider]  Multiple Vitamin (DAILY VITE PO) Take 1 tablet by mouth every morning.     [provider]  NIFEdipine (ADALAT CC) 90 MG 24 hr tablet Take 1 tablet (90 mg total) by mouth daily. 07/26/19   Shon Hale, MD  nitroGLYCERIN  (NITROSTAT) 0.4 MG SL tablet Place 0.4 mg under the tongue every 5 (five) minutes as needed for chest pain.    [provider]  omeprazole (PRILOSEC) 20 MG capsule Take 1 capsule (20 mg total) by mouth daily. 07/25/19   Henderly, Britni A, PA-C  potassium chloride (KLOR-CON) 10 MEQ tablet TAKE (1) TABLET BY MOUTH ONCE DAILY. Patient taking differently: Take 10 mEq by mouth daily.  07/26/19  Roxan Hockey, MD  pravastatin (PRAVACHOL) 40 MG tablet Take 1 tablet (40 mg total) by mouth at bedtime. 07/26/19   Roxan Hockey, MD  sodium bicarbonate 650 MG tablet Take 650 mg by mouth 3 (three) times daily.    [provider]  sucralfate (CARAFATE) 1 GM/10ML suspension Take 10 mLs (1 g total) by mouth 4 (four) times daily -  with meals and at bedtime. Patient taking differently: Take 1 g by mouth 4 (four) times daily.  07/25/19   Henderly, Britni A, PA-C  traZODone (DESYREL) 50 MG tablet Take 50 mg by mouth at bedtime.    [provider]    Inpatient medications: . aspirin EC  81 mg Oral Q breakfast  . carvedilol  6.25 mg Oral BID WC  . cholecalciferol  1,000 Units Oral Daily  . diclofenac sodium  2 g Topical QID  . fish oil-omega-3 fatty acids  1 g Oral TID  . furosemide  80 mg Intravenous BID  . heparin injection (subcutaneous)  5,000 Units Subcutaneous Q8H  . hydrALAZINE  100 mg Oral TID  . insulin aspart  0-5 Units Subcutaneous QHS  . insulin aspart  0-9 Units Subcutaneous TID WC  . isosorbide mononitrate  30 mg Oral Daily  . pantoprazole  40 mg Oral Daily  . pravastatin  40 mg Oral QHS  . sodium bicarbonate  650 mg Oral TID  . sucralfate  1 g Oral QID    Discontinued Meds:   Medications Discontinued During This Encounter  Medication Reason  . nitroGLYCERIN 50 mg in dextrose 5 % 250 mL (0.2 mg/mL) infusion   . NIFEdipine (PROCARDIA-XL/NIFEDICAL-XL) 24 hr tablet 90 mg   . cloNIDine (CATAPRES) tablet 0.1 mg   . enoxaparin (LOVENOX) injection 30 mg   .  albuterol (PROVENTIL) (2.5 MG/3ML) 0.083% nebulizer solution 2.5 mg Duplicate  . albuterol (VENTOLIN HFA) 108 (90 Base) MCG/ACT inhaler 2 puff P&T Policy: Therapeutic Substitute    Social History:  reports that he has been smoking cigarettes. He started smoking about 44 years ago. He has a 30.00 pack-year smoking history. He has never used smokeless tobacco. He reports that he does not drink alcohol and does not use drugs.  Family History:   Family History  Problem Relation Age of Onset  . Hypertension Other   . Arthritis Other     Pertinent items are noted in HPI. Weight change:   Intake/Output Summary (Last 24 hours) at 10/16/2019 1222 Last data filed at 10/16/2019 0550 Gross per 24 hour  Intake 55.44 ml  Output 400 ml  Net -344.56 ml   BP 129/65   Pulse 80   Temp 98.6 F (37 C) (Oral)   Resp 18   Ht 6' (1.829 m)   Wt 111.6 kg   SpO2 99%   BMI 33.36 kg/m  Vitals:   10/16/19 0814 10/16/19 1010 10/16/19 1011 10/16/19 1144  BP:  (!) 143/62  129/65  Pulse: 77  80 80  Resp: $Remo'16 18 14 18  'VoZqh$ Temp:    98.6 F (37 C)  TempSrc:    Oral  SpO2: 97%  99% 99%  Weight:      Height:         General appearance: cooperative, no distress and slowed mentation Head: Normocephalic, without obvious abnormality, atraumatic Resp: rhonchi bilaterally Cardio: regular rate and rhythm, S1, S2 normal, no murmur, click, rub or gallop GI: soft, non-tender; bowel sounds normal; no masses,  no organomegaly Extremities: edema 1+ pretibial edema  Labs: Basic Metabolic Panel: Recent Labs  Lab 10/15/19 2327 10/16/19 0813  NA 134* 137  K 4.0 4.2  CL 100 100  CO2 24 25  GLUCOSE 154* 103*  BUN 47* 49*  CREATININE 5.58* 5.90*  ALBUMIN 3.4* 3.2*  CALCIUM 8.5* 8.9  PHOS  --  5.3*   Liver Function Tests: Recent Labs  Lab 10/15/19 2327 10/16/19 0813  AST 16  --   ALT 15  --   ALKPHOS 109  --   BILITOT 0.6  --   PROT 7.2  --   ALBUMIN 3.4* 3.2*   No results for input(s): LIPASE,  AMYLASE in the last 168 hours. No results for input(s): AMMONIA in the last 168 hours. CBC: Recent Labs  Lab 10/15/19 2327  WBC 10.7*  NEUTROABS 9.4*  HGB 8.4*  HCT 26.5*  MCV 84.1  PLT 172   PT/INR: $Remove'@LABRCNTIP'uMVaOWF$ (inr:5) Cardiac Enzymes: )No results for input(s): CKTOTAL, CKMB, CKMBINDEX, TROPONINI in the last 168 hours. CBG: Recent Labs  Lab 10/16/19 0801  GLUCAP 104*    Iron Studies: No results for input(s): IRON, TIBC, TRANSFERRIN, FERRITIN in the last 168 hours.  Xrays/Other Studies: US RENAL  Result Date: 10/16/2019 CLINICAL DATA:  Acute on chronic renal failure EXAM: RENAL / URINARY TRACT ULTRASOUND COMPLETE COMPARISON:  02/07/2018 FINDINGS: Right Kidney: Renal measurements: 10.6 x 5 x 6.4 cm = volume: 180 mL. Increased echogenicity. No hydronephrosis or mass Left Kidney: Renal measurements: 13 x 5 x 5 cm = volume: 175 mL. Increased echogenicity. 13 mm incidental cyst. No evidence of solid mass. Bladder: Appears normal for degree of bladder distention. IMPRESSION: Medical renal disease. No hydronephrosis or reversible finding. Electronically Signed   By: Monte Fantasia M.D.   On: 10/16/2019 09:11   DG Chest Port 1 View  Result Date: 10/15/2019 CLINICAL DATA:  Short of breath, hypoxia, tobacco abuse EXAM: PORTABLE CHEST 1 VIEW COMPARISON:  07/28/2019 FINDINGS: 2 frontal views of the chest demonstrate a stable cardiac silhouette. Diffuse interstitial prominence is unchanged, likely representing scarring and fibrosis. No acute airspace disease, effusion, or pneumothorax. No acute bony abnormalities. IMPRESSION: 1. Stable interstitial prominence consistent with scarring and fibrosis. 2. No acute airspace disease. Electronically Signed   By: Randa Ngo M.D.   On: 10/15/2019 23:39     Assessment/Plan: 1.  AKI/CKD stage IV- in setting of acute volume overload/CHF possibly cardiorenal syndrome.  No uremic symptoms or indication for dialysis at this time but will cont to follow  UOP and Scr during IV diuresis.   2. Acute on chronic hypoxic respiratory failure due to fluid overload.  Improved with IV lasix 3. CHF- mainly right-sided.  Improving with IV lasix  4. HTN- stable 5. CAD- stable 6. CKD stage IV-V- presumably due to DM/HTN +/- lithium.  He has had progressive decline in renal function over the past 3 months.  Dr. Theador Hawthorne has discussed the eventual need for dialysis and referred him for vascular access, however the appointment was cancelled by his caretaker for unclear reasons.  No indication for dialysis at this time.  He will need to reschedule AVF/AVG placement after discharge.  Hopefully he can avoid initiating HD in the hospital. 7. Schizoaffective disorder- on haldol 8. Anemia of CKD stage IV- will check iron stores and likely start ESA therapy.    Governor Rooks Loriann Bosserman 10/16/2019, 12:22 PM

## 2019-10-16 NOTE — ED Notes (Signed)
Date and time results received: 10/16/19 0011 (use smartphrase ".now" to insert current time)  Test: Troponin Critical Value: 167  Name of Provider Notified: [place]  Orders Received? Or Actions Taken?:

## 2019-10-17 DIAGNOSIS — R778 Other specified abnormalities of plasma proteins: Secondary | ICD-10-CM

## 2019-10-17 DIAGNOSIS — J9621 Acute and chronic respiratory failure with hypoxia: Secondary | ICD-10-CM

## 2019-10-17 DIAGNOSIS — N178 Other acute kidney failure: Secondary | ICD-10-CM

## 2019-10-17 LAB — CBC
HCT: 24.7 % — ABNORMAL LOW (ref 39.0–52.0)
Hemoglobin: 7.6 g/dL — ABNORMAL LOW (ref 13.0–17.0)
MCH: 26 pg (ref 26.0–34.0)
MCHC: 30.8 g/dL (ref 30.0–36.0)
MCV: 84.6 fL (ref 80.0–100.0)
Platelets: 164 10*3/uL (ref 150–400)
RBC: 2.92 MIL/uL — ABNORMAL LOW (ref 4.22–5.81)
RDW: 16.2 % — ABNORMAL HIGH (ref 11.5–15.5)
WBC: 5.9 10*3/uL (ref 4.0–10.5)
nRBC: 0 % (ref 0.0–0.2)

## 2019-10-17 LAB — RENAL FUNCTION PANEL
Albumin: 3 g/dL — ABNORMAL LOW (ref 3.5–5.0)
Anion gap: 10 (ref 5–15)
BUN: 52 mg/dL — ABNORMAL HIGH (ref 6–20)
CO2: 25 mmol/L (ref 22–32)
Calcium: 8.7 mg/dL — ABNORMAL LOW (ref 8.9–10.3)
Chloride: 103 mmol/L (ref 98–111)
Creatinine, Ser: 5.92 mg/dL — ABNORMAL HIGH (ref 0.61–1.24)
GFR calc Af Amer: 11 mL/min — ABNORMAL LOW (ref >60)
GFR calc non Af Amer: 10 mL/min — ABNORMAL LOW (ref >60)
Glucose, Bld: 95 mg/dL (ref 70–99)
Phosphorus: 4.6 mg/dL (ref 2.5–4.6)
Potassium: 3.9 mmol/L (ref 3.5–5.1)
Sodium: 138 mmol/L (ref 135–145)

## 2019-10-17 LAB — GLUCOSE, CAPILLARY
Glucose-Capillary: 91 mg/dL (ref 70–99)
Glucose-Capillary: 145 mg/dL — ABNORMAL HIGH (ref 70–99)
Glucose-Capillary: 95 mg/dL (ref 70–99)

## 2019-10-17 MED ORDER — FUROSEMIDE 10 MG/ML IJ SOLN
80.0000 mg | Freq: Every day | INTRAMUSCULAR | Status: DC
  Administered 2019-10-18: 80 mg via INTRAVENOUS
  Filled 2019-10-17: qty 8

## 2019-10-17 MED ORDER — SODIUM CHLORIDE 0.9 % IV SOLN
510.0000 mg | Freq: Once | INTRAVENOUS | Status: AC
  Administered 2019-10-17: 510 mg via INTRAVENOUS
  Filled 2019-10-17: qty 17

## 2019-10-17 MED ORDER — DARBEPOETIN ALFA 40 MCG/0.4ML IJ SOSY
40.0000 ug | PREFILLED_SYRINGE | Freq: Once | INTRAMUSCULAR | Status: AC
  Administered 2019-10-17: 40 ug via SUBCUTANEOUS
  Filled 2019-10-17: qty 0.4

## 2019-10-17 MED ORDER — IPRATROPIUM-ALBUTEROL 0.5-2.5 (3) MG/3ML IN SOLN
3.0000 mL | Freq: Three times a day (TID) | RESPIRATORY_TRACT | Status: DC
  Administered 2019-10-17 (×2): 3 mL via RESPIRATORY_TRACT
  Filled 2019-10-17 (×2): qty 3

## 2019-10-17 MED ORDER — IPRATROPIUM-ALBUTEROL 0.5-2.5 (3) MG/3ML IN SOLN
3.0000 mL | Freq: Three times a day (TID) | RESPIRATORY_TRACT | Status: DC
  Administered 2019-10-18 – 2019-10-19 (×4): 3 mL via RESPIRATORY_TRACT
  Filled 2019-10-17 (×4): qty 3

## 2019-10-17 NOTE — Plan of Care (Signed)

## 2019-10-17 NOTE — Progress Notes (Signed)
[facility] Progress Note  Name: [name] MRN: [id number] DOB: 1965-05-30  Chief Complaint:  Shortness of breath   Subjective:   had 800 mL uop over 7/14 charted.  States that he made more than that he thinks - we discussed saving all urine and he has a urinal at bedside.  Has been on lasix 80 mg IV BID.   States he wears oxygen at home but he isn't sure how much.  On 3.5 liters on my exam.   Review of systems:  States breathing "doing great"  Denies n/v Denies chest pain ----------- Background on consult:  [name] is an 54 y.o. male with a PMH significant for COPD, HTN, schizoaffective disorder, HLD, depression, DM type 2, tobacco abuse, CAD, and CKD stage IV who presented to [facility] ED with a 1 week history of worsening SOB, orthopnea, lower extremity edema, and increasing abdominal girth.  In the ED he was found to have hypoxia and was placed on BiPap.  CXR revealed stable interstitial prominence consistent with scarring and fibrosis, without acute airspace disease.  He was admitted and started on IV Lasix.  We were consulted due to the development of AKI/CKD stage IV.  The trend in Scr is seen below.  Of note, he is followed by Dr. Theador Hawthorne who feels that his progressive CKD is due to DM and HTN and was seen on 10/09/19 and was to have vascular access placement, however his caregiver cancelled the appointment.  His Scr was 4.45 on 10/02/19 and Hgb 11.9.   Intake/Output Summary (Last 24 hours) at 10/17/2019 1015 Last data filed at 10/17/2019 7711 Gross per 24 hour  Intake 474 ml  Output 800 ml  Net -326 ml    Vitals:  Vitals:   10/16/19 2200 10/17/19 0141 10/17/19 0522 10/17/19 0701  BP: (!) 143/59 (!) 130/51 (!) 157/56   Pulse: 86 85 90   Resp: $Remo'19 18 16   'LTWet$ Temp: 98.7 F (37.1 C) 98.4 F (36.9 C) 98.7 F (37.1 C)   TempSrc: Oral Oral Oral   SpO2: 100% 99% 94%   Weight:    109.1 kg  Height:         Physical Exam:  General adult male in bed in no  acute distress HEENT normocephalic atraumatic extraocular movements intact sclera anicteric Neck supple trachea midline Lungs clear to auscultation bilaterally normal work of breathing at rest on 3.5 liters oxygen Heart S1S2 no rub Abdomen soft nontender obese/distended (states baseline) Extremities no to trace edema  GU - no foley    Medications reviewed   Labs:  BMP Latest Ref Rng & Units 10/17/2019 10/16/2019 10/15/2019  Glucose 70 - 99 mg/dL 95 103(H) 154(H)  BUN 6 - 20 mg/dL 52(H) 49(H) 47(H)  Creatinine 0.61 - 1.24 mg/dL 5.92(H) 5.90(H) 5.58(H)  BUN/Creat Ratio 6 - 22 (calc) - - -  Sodium 135 - 145 mmol/L 138 137 134(L)  Potassium 3.5 - 5.1 mmol/L 3.9 4.2 4.0  Chloride 98 - 111 mmol/L 103 100 100  CO2 22 - 32 mmol/L $RemoveB'25 25 24  'rQNrELLn$ Calcium 8.9 - 10.3 mg/dL 8.7(L) 8.9 8.5(L)     Assessment/Plan:   1.  AKI/CKD stage IV- in setting of acute volume overload/CHF possibly cardiorenal syndrome.  No uremic symptoms or indication for dialysis at this time but will cont to follow UOP and Scr during IV diuresis.  Will reduce lasix to 80 mg IV daily for now.  Asked him to save his urine.  Strict ins/outs ordered. 2. Acute on chronic hypoxic respiratory failure due to fluid overload.  Improved with IV lasix - titrate as above  3. CHF- mainly right-sided.  Improving with IV lasix 4. HTN- acceptable 5. CAD- stable 6. CKD stage IV-V- presumably due to DM/HTN +/- lithium.  He has had progressive decline in renal function over the past 3 months.  Dr. Theador Hawthorne has discussed the eventual need for dialysis and referred him for vascular access, however the appointment was cancelled by his caretaker for unclear reasons.  No indication for dialysis at this time.  He will need to reschedule AVF/AVG placement after discharge.  Hopefully he can avoid initiating HD in the hospital. 7. Schizoaffective disorder- on haldol 8. Anemia of CKD stage IV- iron deficiency - for feraheme on 7/15 and aranesp 40 mcg on 7/15  as well     Claudia Desanctis, MD 10/17/2019 10:28 AM

## 2019-10-17 NOTE — Progress Notes (Signed)
PROGRESS NOTE  [name] AJG:[id number] DOB: 1965/10/13 DOA: 10/15/2019 PCP: Rosita Fire, MD  Brief History:  54 year old male with a history of chronic respiratory failure on 2 L, COPD, CKD stage IV, hypertension, schizo affective disorder, hyperlipidemia, depression, diabetes mellitus type 2, tobacco abuse, coronary artery disease presenting with 1 week history of worsening shortness of breath, orthopnea, and increasing abdominal girth.  The patient has had 2 recent hospitalizations, one from 07/28/2019 to 07/29/2019 for acute on chronic respiratory failure secondary to COPD exacerbation.  In addition, the patient was also admitted from 07/25/2019 until 07/26/2019 for chest pain.  Cardiology did not feel the patient needed further work-up at that time. He denies any fevers, chills, chest pain, nausea, vomiting, diarrhea, abdominal pain.  He has been complaining of increasing abdominal swelling and leg edema.  He has been having orthopnea type symptoms.  He denies any NSAIDs. In the emergency department, the patient was afebrile hemodynamically stable.  He was placed on BiPAP secondary to hypoxia.  Chest x-ray shows stable interstitial prominence and stable right lower lobe atelectasis/infiltrate.  BMP showed sodium 134, serum creatinine 5.58.  WBC was 10.7.  The patient was started on furosemide 120 mg IV.  Assessment/Plan: Acute on chronic respiratory failure with hypoxia -Secondary to fluid overload -Chronically on 2 L nasal cannula prior to admission -Placed on BiPAP in the emergency department>>weaned to 3.5L -Wean oxygen back to baseline as tolerated -Personally reviewed chest x-ray--interstitial prominence, right lower lobe opacity  Fluid overload -Secondary to progression of underlying CKD -Nephrology consult appreciated -Continue IV furosemide -07/26/2019 echo EF 60 to 65%, no WMA, mild to moderate AS, severe LVH -10/17/19--remains fluid overloaded  clinically  Acute on chronic renal failure--CKD stage V -Suspect the patient likely has progression of underlying CKD -Baseline creatinine 4.5-4.7 -Renal ultrasound--medical renal disease -serum creatinine peaked 5.92  Essential hypertension -Restart carvedilol -Continue hydralazine and Imdur -Holding nifedipine and clonidine temporarily  Coronary artery disease -BMS to LAD 07/2008 -Continue aspirin -No chest pain presently -Continue aspirin and carvedilol -Continue Imdur -Personally reviewed EKG--sinus rhythm, LVH changes  Hyperlipidemia -Continue statin  Diabetes mellitus type 2 -NovoLog sliding scale -07/26/2019 hemoglobin A1c 5.8 -10/16/19 hemoglobin A1c--5.6 -d/c ISS and CBG checks -CBGs well controlled  Schizoaffective disorder -Patient on haloperidol decanoate every 14 days prior to admission     Status is: inpatient  The patient will require care spanning > 2 midnights and should be moved to inpatient because: IV treatments appropriate due to intensity of illness or inability to take PO  He remains fluid overloaded requiring IV lasix  Dispo: The patient is from: Group home  Anticipated d/c is to: Group home  Anticipated d/c date is: 2 days when cleared by renal  Patient currently is not medically stable to d/c.        Family Communication: no  Family at bedside  Consultants:  renal  Code Status:  FULL  DVT Prophylaxis:  Yetter Heparin   Procedures: As Listed in Progress Note Above  Antibiotics: None     Subjective: Patient denies fevers, chills, headache, chest pain, dyspnea, nausea, vomiting, diarrhea, abdominal pain, dysuria, hematuria, hematochezia, and melena.   Objective: Vitals:   10/16/19 2200 10/17/19 0141 10/17/19 0522 10/17/19 0701  BP: (!) 143/59 (!) 130/51 (!) 157/56   Pulse: 86 85 90   Resp: $Remo'19 18 16   'nqeZK$ Temp: 98.7 F (37.1 C) 98.4 F (36.9 C) 98.7 F (37.1 C)  TempSrc: Oral Oral Oral   SpO2: 100% 99% 94%   Weight:    109.1 kg  Height:        Intake/Output Summary (Last 24 hours) at 10/17/2019 1309 Last data filed at 10/17/2019 7629 Gross per 24 hour  Intake 474 ml  Output 800 ml  Net -326 ml   Weight change: -2.485 kg Exam:   General:  Pt is alert, follows commands appropriately, not in acute distress  HEENT: No icterus, No thrush, No neck mass, [place]/AT  Cardiovascular: RRR, S1/S2, no rubs, no gallops  Respiratory: bibasilar crackles. No wheeze  Abdomen: Soft/+BS, non tender, non distended, no guarding  Extremities: 2+ edema, No lymphangitis, No petechiae, No rashes, no synovitis   Data Reviewed: I have personally reviewed following labs and imaging studies Basic Metabolic Panel: Recent Labs  Lab 10/15/19 2327 10/16/19 0813 10/17/19 0551  NA 134* 137 138  K 4.0 4.2 3.9  CL 100 100 103  CO2 24 25 25   GLUCOSE 154* 103* 95  BUN 47* 49* 52*  CREATININE 5.58* 5.90* 5.92*  CALCIUM 8.5* 8.9 8.7*  PHOS  --  5.3* 4.6   Liver Function Tests: Recent Labs  Lab 10/15/19 2327 10/16/19 0813 10/17/19 0551  AST 16  --   --   ALT 15  --   --   ALKPHOS 109  --   --   BILITOT 0.6  --   --   PROT 7.2  --   --   ALBUMIN 3.4* 3.2* 3.0*   No results for input(s): LIPASE, AMYLASE in the last 168 hours. No results for input(s): AMMONIA in the last 168 hours. Coagulation Profile: No results for input(s): INR, PROTIME in the last 168 hours. CBC: Recent Labs  Lab 10/15/19 2327 10/17/19 0551  WBC 10.7* 5.9  NEUTROABS 9.4*  --   HGB 8.4* 7.6*  HCT 26.5* 24.7*  MCV 84.1 84.6  PLT 172 164   Cardiac Enzymes: No results for input(s): CKTOTAL, CKMB, CKMBINDEX, TROPONINI in the last 168 hours. BNP: Invalid input(s): POCBNP CBG: Recent Labs  Lab 10/16/19 0801 10/16/19 1322 10/16/19 1657 10/16/19 2121 10/17/19 0735  GLUCAP 104* 101* 98 129* 91   HbA1C: Recent Labs    10/16/19 0139  HGBA1C 5.6   Urine analysis:     Component Value Date/Time   COLORURINE STRAW (A) 07/25/2019 2157   APPEARANCEUR CLEAR 07/25/2019 2157   LABSPEC 1.003 (L) 07/25/2019 2157   PHURINE 6.0 07/25/2019 2157   GLUCOSEU NEGATIVE 07/25/2019 2157   HGBUR NEGATIVE 07/25/2019 2157   BILIRUBINUR NEGATIVE 07/25/2019 2157   KETONESUR NEGATIVE 07/25/2019 2157   PROTEINUR >=300 (A) 07/25/2019 2157   UROBILINOGEN 0.2 12/17/2012 1644   NITRITE NEGATIVE 07/25/2019 2157   LEUKOCYTESUR NEGATIVE 07/25/2019 2157   Sepsis Labs: @LABRCNTIP (procalcitonin:4,lacticidven:4) ) Recent Results (from the past 240 hour(s))  SARS Coronavirus 2 by RT PCR (hospital order, performed in [facility] hospital lab) Nasopharyngeal Nasopharyngeal Swab     Status: None   Collection Time: 10/15/19 11:17 PM   Specimen: Nasopharyngeal Swab  Result Value Ref Range Status   SARS Coronavirus 2 NEGATIVE NEGATIVE Final    Comment: (NOTE) SARS-CoV-2 target nucleic acids are NOT DETECTED.  The SARS-CoV-2 RNA is generally detectable in upper and lower respiratory specimens during the acute phase of infection. The lowest concentration of SARS-CoV-2 viral copies this assay can detect is 250 copies / mL. A negative result does not preclude SARS-CoV-2 infection and should not be used as the sole  basis for treatment or other patient management decisions.  A negative result may occur with improper specimen collection / handling, submission of specimen other than nasopharyngeal swab, presence of viral mutation(s) within the areas targeted by this assay, and inadequate number of viral copies (<250 copies / mL). A negative result must be combined with clinical observations, patient history, and epidemiological information.  Fact Sheet for Patients:   StrictlyIdeas.no  Fact Sheet for Healthcare Providers: BankingDealers.co.za  This test is not yet approved or  cleared by the [place] FDA and has been authorized for  detection and/or diagnosis of SARS-CoV-2 by FDA under an Emergency Use Authorization (EUA).  This EUA will remain in effect (meaning this test can be used) for the duration of the COVID-19 declaration under Section 564(b)(1) of the Act, 21 U.S.C. section 360bbb-3(b)(1), unless the authorization is terminated or revoked sooner.  Performed at [facility], [address], [place], [place] [address]      Scheduled Meds: . aspirin EC  81 mg Oral Q breakfast  . carvedilol  6.25 mg Oral BID WC  . cholecalciferol  1,000 Units Oral Daily  . diclofenac sodium  2 g Topical QID  . fish oil-omega-3 fatty acids  1 g Oral TID  . [START ON 10/18/2019] furosemide  80 mg Intravenous Daily  . heparin injection (subcutaneous)  5,000 Units Subcutaneous Q8H  . hydrALAZINE  100 mg Oral TID  . insulin aspart  0-5 Units Subcutaneous QHS  . insulin aspart  0-9 Units Subcutaneous TID WC  . isosorbide mononitrate  30 mg Oral Daily  . pantoprazole  40 mg Oral Daily  . pravastatin  40 mg Oral QHS  . sodium bicarbonate  650 mg Oral TID  . sucralfate  1 g Oral QID   Continuous Infusions:  Procedures/Studies: US RENAL  Result Date: 10/16/2019 CLINICAL DATA:  Acute on chronic renal failure EXAM: RENAL / URINARY TRACT ULTRASOUND COMPLETE COMPARISON:  02/07/2018 FINDINGS: Right Kidney: Renal measurements: 10.6 x 5 x 6.4 cm = volume: 180 mL. Increased echogenicity. No hydronephrosis or mass Left Kidney: Renal measurements: 13 x 5 x 5 cm = volume: 175 mL. Increased echogenicity. 13 mm incidental cyst. No evidence of solid mass. Bladder: Appears normal for degree of bladder distention. IMPRESSION: Medical renal disease. No hydronephrosis or reversible finding. Electronically Signed   By: Monte Fantasia M.D.   On: 10/16/2019 09:11   DG Chest Port 1 View  Result Date: 10/15/2019 CLINICAL DATA:  Short of breath, hypoxia, tobacco abuse EXAM: PORTABLE CHEST 1 VIEW COMPARISON:  07/28/2019 FINDINGS: 2 frontal views of the  chest demonstrate a stable cardiac silhouette. Diffuse interstitial prominence is unchanged, likely representing scarring and fibrosis. No acute airspace disease, effusion, or pneumothorax. No acute bony abnormalities. IMPRESSION: 1. Stable interstitial prominence consistent with scarring and fibrosis. 2. No acute airspace disease. Electronically Signed   By: Randa Ngo M.D.   On: 10/15/2019 23:39    Orson Eva, DO  [facility]  If 7PM-7AM, please contact night-coverage [email or web address] Password [email or web address] 10/17/2019, 1:09 PM   LOS: 1 day

## 2019-10-18 LAB — URINALYSIS, COMPLETE (UACMP) WITH MICROSCOPIC
Bacteria, UA: NONE SEEN
Bilirubin Urine: NEGATIVE
Glucose, UA: NEGATIVE mg/dL
Hgb urine dipstick: NEGATIVE
Ketones, ur: NEGATIVE mg/dL
Leukocytes,Ua: NEGATIVE
Nitrite: NEGATIVE
Protein, ur: 30 mg/dL — AB
Specific Gravity, Urine: 1.005 (ref 1.005–1.030)
pH: 7 (ref 5.0–8.0)

## 2019-10-18 LAB — RENAL FUNCTION PANEL
Albumin: 2.8 g/dL — ABNORMAL LOW (ref 3.5–5.0)
Anion gap: 17 — ABNORMAL HIGH (ref 5–15)
BUN: 52 mg/dL — ABNORMAL HIGH (ref 6–20)
CO2: 26 mmol/L (ref 22–32)
Calcium: 9.4 mg/dL (ref 8.9–10.3)
Chloride: 101 mmol/L (ref 98–111)
Creatinine, Ser: 5.99 mg/dL — ABNORMAL HIGH (ref 0.61–1.24)
GFR calc Af Amer: 11 mL/min — ABNORMAL LOW (ref >60)
GFR calc non Af Amer: 10 mL/min — ABNORMAL LOW (ref >60)
Glucose, Bld: 83 mg/dL (ref 70–99)
Phosphorus: 5.2 mg/dL — ABNORMAL HIGH (ref 2.5–4.6)
Potassium: 3.7 mmol/L (ref 3.5–5.1)
Sodium: 144 mmol/L (ref 135–145)

## 2019-10-18 MED ORDER — CARVEDILOL 12.5 MG PO TABS
12.5000 mg | ORAL_TABLET | Freq: Two times a day (BID) | ORAL | Status: DC
  Administered 2019-10-18 – 2019-10-19 (×2): 12.5 mg via ORAL
  Filled 2019-10-18 (×2): qty 1

## 2019-10-18 MED ORDER — FUROSEMIDE 20 MG PO TABS: 60.0000 mg | ORAL_TABLET | Freq: Every day | ORAL | 1 refills | Status: AC

## 2019-10-18 MED ORDER — NEPRO/CARBSTEADY PO LIQD: 237.0000 mL | ORAL | Status: DC

## 2019-10-18 MED ORDER — OMEGA-3-ACID ETHYL ESTERS 1 G PO CAPS
1.0000 g | ORAL_CAPSULE | Freq: Three times a day (TID) | ORAL | Status: DC
  Administered 2019-10-18 – 2019-10-19 (×3): 1 g via ORAL
  Filled 2019-10-18 (×3): qty 1

## 2019-10-18 MED ORDER — FUROSEMIDE 40 MG PO TABS
60.0000 mg | ORAL_TABLET | Freq: Every day | ORAL | Status: DC
  Administered 2019-10-19: 60 mg via ORAL
  Filled 2019-10-18: qty 1

## 2019-10-18 NOTE — NC FL2 (Deleted)
[place] LEVEL OF CARE SCREENING TOOL     IDENTIFICATION  Patient Name: [name] Birthdate: 05/06/1965 Sex: male Admission Date (Current Location): 10/15/2019  [place] and [place] Number:  [place] [id number] [place] and Address:  [facility] [address] [place]      Provider Number: [id number]  Attending Physician Name and Address:  Orson Eva, MD  Relative Name and Phone Number:  De Blanch (owner of [employer]) Ph: [phone]    Current Level of Care: Hospital Recommended Level of Care: [place] ([place]) Prior Approval Number:    Date Approved/Denied:   PASRR Number:    Discharge Plan: Other (Comment) ([place])    Current Diagnoses: Patient Active Problem List   Diagnosis Date Noted  . Volume overload 10/16/2019  . Acute on chronic respiratory failure with hypoxia ([place]) 10/16/2019  . Acute renal failure superimposed on stage 5 chronic kidney disease, not on chronic dialysis ([place]) 10/16/2019  . Acute hypoxemic respiratory failure ([place]) 07/28/2019  . Elevated troponin 07/26/2019  . Vomiting 10/02/2018  . Acute on chronic renal failure ([place]) 02/06/2018  . Schizoaffective disorder, bipolar type ([place]) 01/04/2016  . Acute renal failure ([place]) 12/17/2012  . Dehydration 12/17/2012  . Diarrhea 12/17/2012  . Gastroenteritis 12/17/2012  . GERD (gastroesophageal reflux disease) 12/17/2012  . Metabolic acidosis 25/18/9842  . Chronic kidney disease, stage 2, mildly decreased GFR 03/19/2012  . CAD (coronary artery disease)   . Schizophrenia ([place])   . Tobacco abuse   . Diabetes mellitus, type 2 ([place])   . Hypertension   . Hyperlipidemia   . Aortic insufficiency     Orientation RESPIRATION BLADDER Height & Weight     Self, Time, Situation, Place  O2 Continent Weight: 240 lb 11.9 oz (109.2 kg) Height:  6' (182.9 cm)  BEHAVIORAL SYMPTOMS/MOOD NEUROLOGICAL BOWEL NUTRITION  STATUS      Continent Diet (Carb modified)  AMBULATORY STATUS COMMUNICATION OF NEEDS Skin   Supervision Verbally Normal                       Personal Care Assistance Level of Assistance  Bathing, Feeding, Dressing Bathing Assistance: Limited assistance Feeding assistance: Independent Dressing Assistance: Limited assistance     Functional Limitations Info  Sight, Hearing, Speech Sight Info: Adequate Hearing Info: Adequate Speech Info: Adequate    SPECIAL CARE FACTORS FREQUENCY                       Contractures Contractures Info: Not present    Additional Factors Info  Code Status, Allergies Code Status Info: Full Allergies Info: NKA           Current Medications (10/18/2019):  This is the current hospital active medication list Current Facility-Administered Medications  Medication Dose Route Frequency Provider Last Rate Last Admin  . acetaminophen (TYLENOL) tablet 650 mg  650 mg Oral Q6H PRN Reubin Milan, MD       Or  . acetaminophen (TYLENOL) suppository 650 mg  650 mg Rectal Q6H PRN Reubin Milan, MD      . aspirin EC tablet 81 mg  81 mg Oral Q breakfast Reubin Milan, MD   81 mg at 10/18/19 0850  . carvedilol (COREG) tablet 6.25 mg  6.25 mg Oral BID WC Orson Eva, MD   6.25 mg at 10/18/19 0953  . cholecalciferol (VITAMIN D3) tablet 1,000 Units  1,000 Units  Oral Daily Reubin Milan, MD   1,000 Units at 10/18/19 [phone]  . diclofenac sodium (VOLTAREN) 1 % transdermal gel 2 g  2 g Topical QID Reubin Milan, MD   2 g at 10/18/19 0853  . [START ON 10/19/2019] furosemide (LASIX) tablet 60 mg  60 mg Oral Daily Claudia Desanctis, MD      . heparin injection 5,000 Units  5,000 Units Subcutaneous Franco Collet, MD   5,000 Units at 10/18/19 0551  . hydrALAZINE (APRESOLINE) tablet 100 mg  100 mg Oral TID Reubin Milan, MD   100 mg at 10/18/19 0850  . ipratropium-albuterol (DUONEB) 0.5-2.5 (3) MG/3ML nebulizer solution 3 mL  3 mL  Nebulization TID Tat, Shanon Brow, MD   3 mL at 10/18/19 1010  . isosorbide mononitrate (IMDUR) 24 hr tablet 30 mg  30 mg Oral Daily Reubin Milan, MD   30 mg at 10/18/19 0851  . loperamide (IMODIUM) capsule 2 mg  2 mg Oral QID PRN Reubin Milan, MD      . omega-3 acid ethyl esters (LOVAZA) capsule 1 g  1 g Oral TID Tat, David, MD      . ondansetron [facility]) tablet 4 mg  4 mg Oral Q6H PRN Reubin Milan, MD       Or  . ondansetron [facility]) injection 4 mg  4 mg Intravenous Q6H PRN Reubin Milan, MD      . pantoprazole (PROTONIX) EC tablet 40 mg  40 mg Oral Daily Reubin Milan, MD   40 mg at 10/18/19 0851  . pravastatin (PRAVACHOL) tablet 40 mg  40 mg Oral QHS Reubin Milan, MD   40 mg at 10/17/19 2042  . sodium bicarbonate tablet 650 mg  650 mg Oral TID Reubin Milan, MD   650 mg at 10/18/19 0849  . sucralfate (CARAFATE) 1 GM/10ML suspension 1 g  1 g Oral QID Reubin Milan, MD   1 g at 10/18/19 8329     Discharge Medications: Please see discharge summary for a list of discharge medications.  Relevant Imaging Results:  Relevant Lab Results:   Additional Information SSN: [id number]  Sherie Don, LCSW

## 2019-10-18 NOTE — Progress Notes (Signed)
Initial Nutrition Assessment  DOCUMENTATION CODES:   Obesity unspecified  INTERVENTION:  Nepro Shake po daily, each supplement provides 425 kcal and 19 grams protein  Recommend low Na diet  NUTRITION DIAGNOSIS:   Increased nutrient needs related to chronic illness (CKD IV-V, COPD) as evidenced by estimated needs. GOAL:   Patient will meet greater than or equal to 90% of their needs   MONITOR:   Weight trends, I & O's, Labs, PO intake, Supplement acceptance  REASON FOR ASSESSMENT:   Malnutrition Screening Tool    ASSESSMENT:  RD working remotely.   54 year old male admitted for volume overload with past medical history significant for aortic insufficiency, atherosclerotic CVD, axillary abscess, cerebrovascular disease, COPD, DM2, HLD, HTN, mild anemia, obesity, schizophrenia, sinus bradycardia presented with progressively worse dyspnea over the past several days assoicated with orthopnea, LE edema, fatigue, and cough productive of whitish sputum.  Per notes, nephrology consulted for suspected progression of underlying CKD. Patient noted with progressive decline in renal function over the past 3 months and is followed by Dr. Theador Hawthorne, last seen on 10/09/19. Pt was to have vascular access placement, however caregiver cancelled appointment. No indication for dialysis at this time, reschedule AVF/AVG placement after discharge per nephrology.  Patient is a very poor historian, reports good appetite at home, eats cereal for breakfast, unsure of what he eats for lunch or dinner, says he likes vegetable sometimes. Patient has poor dentition, denies chewing/swallowing difficulties. He is on CM diet, no documented meals for review at this time, last A1c 5.6 on 7/14 indicating excellent control, recommend low Na diet. Will order Nepro Shake daily to aid with meeting needs.   Per chart, weights have been fairly stable (230-243 lb) over the past 2 years.   I/Os: -1445 ml since admit      -775  ml x 24 hrs UOP: 900 ml x 24 hrs Medications reviewed and include: Coreg, D3, Voltaren, Lasix 60 mg daily, Lovaza, Protonix, Sodium bicarbonate, Carafate  Labs: CBGs 95,145,91 BUN 52 (H), Cr 5.99 (H), P 5.2 (H), BNP 330 (H) Lab Results  Component Value Date   HGBA1C 5.6 10/16/2019  07/26/19 A1c - 5.8 12/26/17 A1c - 6.1 07/28/08 A1c - 6.4  NUTRITION - FOCUSED PHYSICAL EXAM: Unable to complete at this time, RD working remotely.  Diet Order:   Diet Order            Diet Carb Modified Fluid consistency: Thin; Room service appropriate? Yes  Diet effective now                 EDUCATION NEEDS:   Not appropriate for education at this time  Skin:  Skin Assessment: Reviewed RN Assessment  Last BM:  7/14  Height:   Ht Readings from Last 1 Encounters:  10/15/19 6' (1.829 m)    Weight:   Wt Readings from Last 1 Encounters:  10/18/19 109.2 kg    Ideal Body Weight:  80.9 kg  BMI:  Body mass index is 32.65 kg/m.  Estimated Nutritional Needs:   Kcal:  1194-1740  Protein:  115-125  Fluid:  >/= 2.3 L   Lajuan Lines, RD, LDN Clinical Nutrition After Hours/Weekend Pager # in [place]

## 2019-10-18 NOTE — Progress Notes (Signed)
PROGRESS NOTE  [name] MGQ:[id number] DOB: 09/18/1965 DOA: 10/15/2019 PCP: Rosita Fire, MD  Brief History: 54 year old male with a history of chronic respiratory failure on 2 L, COPD, CKD stage IV, hypertension, schizo affective disorder, hyperlipidemia, depression, diabetes mellitus type 2, tobacco abuse, coronary artery disease presenting with 1 week history of worsening shortness of breath, orthopnea, and increasing abdominal girth. The patient has had 2 recent hospitalizations, one from 07/28/2019 to 07/29/2019 for acute on chronic respiratory failure secondary to COPD exacerbation. In addition, the patient was also admitted from 07/25/2019 until 07/26/2019 for chest pain. Cardiology did not feel the patient needed further work-up at that time. He denies any fevers, chills, chest pain, nausea, vomiting, diarrhea, abdominal pain. He has been complaining of increasing abdominal swelling and leg edema. He has been having orthopnea type symptoms. He denies any NSAIDs. In the emergency department, the patient was afebrile hemodynamically stable. He was placed on BiPAP secondary to hypoxia. Chest x-ray shows stable interstitial prominence and stable right lower lobe atelectasis/infiltrate. BMP showed sodium 134, serum creatinine 5.58. WBC was 10.7. The patient was started on furosemide 120 mg IV.  Assessment/Plan: Acute on chronic respiratory failure with hypoxia -Secondary to fluid overload -Chronically on 2 L [place]et at night prior to admission -Placed on BiPAP in the emergency department>>weaned to 3.5L>>>2L -Wean oxygen back to baseline as tolerated -Personally reviewed chest x-ray--interstitial prominence, right lower lobe opacity  Fluid overload -Secondary to progression of underlying CKD -Nephrology consult appreciated -Continue IV furosemide>>po on 7/17 -07/26/2019 echo EF 60 to 65%, no WMA, mild to moderate AS, severe LVH -10/18/19--remains fluid overloaded  clinically  Acute on chronic renal failure--CKD stage V -Suspect the patient likely has progression of underlying CKD -Baseline creatinine 4.5-4.7 -Renal ultrasound--medical renal disease -serum creatinine peaked 5.99 -he has appointment with vein and vascular for HD access on 10/24/19 at [place] [facility] -Restart carvedilol -Continue hydralazine and Imdur -Holding nifedipine and clonidine temporarily -increase carvedilol dose  Coronary artery disease -BMS to LAD4/2010 -Continue aspirin -No chest pain presently -Continue aspirin and carvedilol -Continue Imdur -Personally reviewed EKG--sinus rhythm, LVH changes  Hyperlipidemia -Continue statin  Diabetes mellitus type 2 -NovoLog sliding scale -07/26/2019 hemoglobin A1c 5.8 -10/16/19 hemoglobin A1c--5.6 -d/c ISS and CBG checks -CBGs well controlled  Schizoaffective disorder -Patient on haloperidol decanoate every 14 days prior to admission   Deconditioning -PT eval -OOB each shift    Status is: inpatient  The patient will require care spanning > 2 midnights and should be moved to inpatient because:IV treatments appropriate due to intensity of illness or inability to take PO He remains fluid overloaded requiring IV lasix  Dispo: The patient is from:Group home Anticipated d/c is OI:[facility] home Anticipated d/c date is: 1-2 days when cleared by renal Patient currently is not medically stable to d/c.        Family Communication:caretaker updated at bedside  Consultants:renal  Code Status: FULL  DVT Prophylaxis: [place] Heparin   Procedures: As Listed in Progress Note Above  Antibiotics: None     Subjective: Patient denies fevers, chills, headache, chest pain, dyspnea, nausea, vomiting, diarrhea, abdominal pain, dysuria, hematuria, hematochezia, and melena.   Objective: Vitals:   10/17/19 2225 10/18/19 0500 10/18/19  0604 10/18/19 1010  BP: (!) 136/51  (!) 147/59   Pulse: 78  79   Resp: 20  18   Temp: 99.1 F (37.3 C)  98.7 F (37.1 C)   TempSrc: Oral  SpO2: 96%  99% 98%  Weight:  109.2 kg    Height:        Intake/Output Summary (Last 24 hours) at 10/18/2019 1116 Last data filed at 10/17/2019 1600 Gross per 24 hour  Intake 124.92 ml  Output 900 ml  Net -775.08 ml   Weight change:  Exam:   General:  Pt is alert, follows commands appropriately, not in acute distress  HEENT: No icterus, No thrush, No neck mass, [place]/AT  Cardiovascular: RRR, S1/S2, no rubs, no gallops  Respiratory: bibasilar crackles.  No wheeze  Abdomen: Soft/+BS, non tender, non distended, no guarding  Extremities: 1+LE edema, No lymphangitis, No petechiae, No rashes, no synovitis   Data Reviewed: I have personally reviewed following labs and imaging studies Basic Metabolic Panel: Recent Labs  Lab 10/15/19 2327 10/16/19 0813 10/17/19 0551 10/18/19 0606  NA 134* 137 138 144  K 4.0 4.2 3.9 3.7  CL 100 100 103 101  CO2 $Re'24 25 25 26  'IUJ$ GLUCOSE 154* 103* 95 83  BUN 47* 49* 52* 52*  CREATININE 5.58* 5.90* 5.92* 5.99*  CALCIUM 8.5* 8.9 8.7* 9.4  PHOS  --  5.3* 4.6 5.2*   Liver Function Tests: Recent Labs  Lab 10/15/19 2327 10/16/19 0813 10/17/19 0551 10/18/19 0606  AST 16  --   --   --   ALT 15  --   --   --   ALKPHOS 109  --   --   --   BILITOT 0.6  --   --   --   PROT 7.2  --   --   --   ALBUMIN 3.4* 3.2* 3.0* 2.8*   No results for input(s): LIPASE, AMYLASE in the last 168 hours. No results for input(s): AMMONIA in the last 168 hours. Coagulation Profile: No results for input(s): INR, PROTIME in the last 168 hours. CBC: Recent Labs  Lab 10/15/19 2327 10/17/19 0551  WBC 10.7* 5.9  NEUTROABS 9.4*  --   HGB 8.4* 7.6*  HCT 26.5* 24.7*  MCV 84.1 84.6  PLT 172 164   Cardiac Enzymes: No results for input(s): CKTOTAL, CKMB, CKMBINDEX, TROPONINI in the last 168 hours. BNP: Invalid input(s):  POCBNP CBG: Recent Labs  Lab 10/16/19 1657 10/16/19 2121 10/17/19 0735 10/17/19 1158 10/17/19 2229  GLUCAP 98 129* 91 145* 95   HbA1C: Recent Labs    10/16/19 0139  HGBA1C 5.6   Urine analysis:    Component Value Date/Time   COLORURINE STRAW (A) 07/25/2019 2157   APPEARANCEUR CLEAR 07/25/2019 2157   LABSPEC 1.003 (L) 07/25/2019 2157   PHURINE 6.0 07/25/2019 2157   GLUCOSEU NEGATIVE 07/25/2019 2157   HGBUR NEGATIVE 07/25/2019 2157   [place] NEGATIVE 07/25/2019 2157   KETONESUR NEGATIVE 07/25/2019 2157   PROTEINUR >=300 (A) 07/25/2019 2157   UROBILINOGEN 0.2 12/17/2012 1644   NITRITE NEGATIVE 07/25/2019 2157   LEUKOCYTESUR NEGATIVE 07/25/2019 2157   Sepsis Labs: $RemoveBefo'@[id number]$ (procalcitonin:4,lacticidven:4) ) Recent Results (from the past 240 hour(s))  SARS Coronavirus 2 by RT PCR (hospital order, performed in [place] hospital lab) Nasopharyngeal Nasopharyngeal Swab     Status: None   Collection Time: 10/15/19 11:17 PM   Specimen: Nasopharyngeal Swab  Result Value Ref Range Status   SARS Coronavirus 2 NEGATIVE NEGATIVE Final    Comment: (NOTE) SARS-CoV-2 target nucleic acids are NOT DETECTED.  The SARS-CoV-2 RNA is generally detectable in upper and lower respiratory specimens during the acute phase of infection. The lowest concentration of SARS-CoV-2 viral copies this assay  can detect is 250 copies / mL. A negative result does not preclude SARS-CoV-2 infection and should not be used as the sole basis for treatment or other patient management decisions.  A negative result may occur with improper specimen collection / handling, submission of specimen other than nasopharyngeal swab, presence of viral mutation(s) within the areas targeted by this assay, and inadequate number of viral copies (<250 copies / mL). A negative result must be combined with clinical observations, patient history, and epidemiological information.  Fact Sheet for Patients:     StrictlyIdeas.no  Fact Sheet for Healthcare Providers: BankingDealers.co.za  This test is not yet approved or  cleared by the [place] FDA and has been authorized for detection and/or diagnosis of SARS-CoV-2 by FDA under an Emergency Use Authorization (EUA).  This EUA will remain in effect (meaning this test can be used) for the duration of the COVID-19 declaration under Section 564(b)(1) of the Act, 21 U.S.C. section 360bbb-3(b)(1), unless the authorization is terminated or revoked sooner.  Performed at [facility], [address] [place], [place] [address]      Scheduled Meds: . aspirin EC  81 mg Oral Q breakfast  . carvedilol  6.25 mg Oral BID WC  . cholecalciferol  1,000 Units Oral Daily  . diclofenac sodium  2 g Topical QID  . [START ON 10/19/2019] furosemide  60 mg Oral Daily  . heparin injection (subcutaneous)  5,000 Units Subcutaneous Q8H  . hydrALAZINE  100 mg Oral TID  . ipratropium-albuterol  3 mL Nebulization TID  . isosorbide mononitrate  30 mg Oral Daily  . omega-3 acid ethyl esters  1 g Oral TID  . pantoprazole  40 mg Oral Daily  . pravastatin  40 mg Oral QHS  . sodium bicarbonate  650 mg Oral TID  . sucralfate  1 g Oral QID   Continuous Infusions:  Procedures/Studies: US RENAL  Result Date: 10/16/2019 CLINICAL DATA:  Acute on chronic renal failure EXAM: RENAL / URINARY TRACT ULTRASOUND COMPLETE COMPARISON:  02/07/2018 FINDINGS: Right Kidney: Renal measurements: 10.6 x 5 x 6.4 cm = volume: 180 mL. Increased echogenicity. No hydronephrosis or mass Left Kidney: Renal measurements: 13 x 5 x 5 cm = volume: 175 mL. Increased echogenicity. 13 mm incidental cyst. No evidence of solid mass. Bladder: Appears normal for degree of bladder distention. IMPRESSION: Medical renal disease. No hydronephrosis or reversible finding. Electronically Signed   By: Monte Fantasia M.D.   On: 10/16/2019 09:11   DG Chest Port 1  View  Result Date: 10/15/2019 CLINICAL DATA:  Short of breath, hypoxia, tobacco abuse EXAM: PORTABLE CHEST 1 VIEW COMPARISON:  07/28/2019 FINDINGS: 2 frontal views of the chest demonstrate a stable cardiac silhouette. Diffuse interstitial prominence is unchanged, likely representing scarring and fibrosis. No acute airspace disease, effusion, or pneumothorax. No acute bony abnormalities. IMPRESSION: 1. Stable interstitial prominence consistent with scarring and fibrosis. 2. No acute airspace disease. Electronically Signed   By: Randa Ngo M.D.   On: 10/15/2019 23:39    Orson Eva, DO  [facility]  If 7PM-7AM, please contact night-coverage [email or web address] Password [email or web address] 10/18/2019, 11:16 AM   LOS: 2 days

## 2019-10-18 NOTE — TOC Progression Note (Addendum)
Transition of Care [facility]) - Progression Note   Patient Details  Name: [name] MRN: [id number] Date of Birth: 12-26-1965  Transition of [facility]) CM/SW [place], LCSW Phone Number: 10/18/2019, 2:42 PM  Clinical Narrative: CSW called De [place]anch, who is over [facility], to follow up with what will be needed in order to have a weekend discharge. Per Ms. Marcello Moores, a new FL2 will need to be sent and any changed medications will need to be sent to [facility] at [address] [place], [place] [address]. Hospitalist aware that medication changes will need to be sent today as the pharmacy is closed over the weekend. Ms. Marcello Moores stated the [facility] can be printed and sent back with the patient's other discharge paperwork.  Expected Discharge Plan: Rest Home Barriers to Discharge: Continued Medical Work up  Expected Discharge Plan and Services Expected Discharge Plan: Rest Home In-house Referral: Clinical Social Work Discharge Planning Services: NA Living arrangements for the past 2 months: Group Home ([place])  Readmission Risk Interventions No flowsheet data found.

## 2019-10-18 NOTE — Plan of Care (Signed)

## 2019-10-18 NOTE — Evaluation (Signed)
Physical Therapy Evaluation Patient Details Name: [name] MRN: [id number] DOB: [birth year] Today's Date: 10/18/2019   History of Present Illness  [name] is a 54 y.o. male with medical history significant of aortic insufficiency, atherosclerotic cardiovascular disease, axillary abscess, cerebrovascular disease, COPD, type 2 diabetes, hyperlipidemia, hypertension, mild anemia, obesity, schizophrenia, sinus bradycardia, tobacco abuse who is coming to the emergency department with progressively worse dyspnea for several days associated with orthopnea, lower extremity edema fatigue, cough with clear whitish productive sputum, but denies chest pain, palpitations, dizziness, diaphoresis, PND, abdominal pain, nausea, vomiting, diarrhea, constipation, melena or hematochezia.  No dysuria, frequency or materia no polyuria, polydipsia, polyphagia or blurred vision.    Clinical Impression  Patient functioning at baseline for functional mobility and gait.  Plan:  Patient discharged from physical therapy to care of nursing for ambulation daily as tolerated for length of stay.     Follow Up Recommendations No PT follow up    Equipment Recommendations  None recommended by PT    Recommendations for Other Services       Precautions / Restrictions Precautions Precautions: None Restrictions Weight Bearing Restrictions: No      Mobility  Bed Mobility Overal bed mobility: Modified Independent                Transfers Overall transfer level: Modified independent                  Ambulation/Gait Ambulation/Gait assistance: Modified independent (Device/Increase time) Gait Distance (Feet): 100 Feet Assistive device: None Gait Pattern/deviations: WFL(Within Functional Limits) Gait velocity: slightly decreased   General Gait Details: demonstrates good return for ambulation in room and hallways without loss of balance, limited secondary to fatigue and SpO2 dropping  from 94% to 85% while on room air  Stairs            Wheelchair Mobility    Modified Rankin (Stroke Patients Only)       Balance Overall balance assessment: No apparent balance deficits (not formally assessed)                                           Pertinent Vitals/Pain Pain Assessment: No/denies pain    Home Living Family/patient expects to be discharged to:: Group home                      Prior Function Level of Independence: Independent         Comments: household ambulator without AD, has a quad cane if needed     Hand Dominance        Extremity/Trunk Assessment   Upper Extremity Assessment Upper Extremity Assessment: Overall WFL for tasks assessed    Lower Extremity Assessment Lower Extremity Assessment: Overall WFL for tasks assessed    Cervical / Trunk Assessment Cervical / Trunk Assessment: Normal  Communication   Communication: No difficulties  Cognition Arousal/Alertness: Awake/alert Behavior During Therapy: WFL for tasks assessed/performed Overall Cognitive Status: Within Functional Limits for tasks assessed                                        General Comments      Exercises     Assessment/Plan    PT Assessment Patent does not need any further PT services  PT Problem List         PT Treatment Interventions      PT Goals (Current goals can be found in the Care Plan section)  Acute Rehab PT Goals Patient Stated Goal: return home PT Goal Formulation: With patient Time For Goal Achievement: 10/18/19 Potential to Achieve Goals: Good    Frequency     Barriers to discharge        Co-evaluation               AM-PAC PT "6 Clicks" Mobility  Outcome Measure Help needed turning from your back to your side while in a flat bed without using bedrails?: None Help needed moving from lying on your back to sitting on the side of a flat bed without using bedrails?: None Help  needed moving to and from a bed to a chair (including a wheelchair)?: None Help needed standing up from a chair using your arms (e.g., wheelchair or bedside chair)?: None Help needed to walk in hospital room?: None Help needed climbing 3-5 steps with a railing? : None 6 Click Score: 24    End of Session   Activity Tolerance: Patient tolerated treatment well;Patient limited by fatigue Patient left: in chair;with call bell/phone within reach Nurse Communication: Mobility status PT Visit Diagnosis: Unsteadiness on feet (R26.81);Other abnormalities of gait and mobility (R26.89);Muscle weakness (generalized) (M62.81)    Time: 1962-2297 PT Time Calculation (min) (ACUTE ONLY): 22 min   Charges:   PT Evaluation $PT Eval Moderate Complexity: 1 Mod PT Treatments $Therapeutic Activity: 8-22 mins        3:03 PM, 10/18/19 Lonell Grandchild, MPT Physical Therapist with [facility] [phone] office [phone] mobile phone

## 2019-10-18 NOTE — Progress Notes (Signed)
[facility] Progress Note  Name: MARKIS LANGLAND MRN: [id number] DOB: 04-Jan-1966  Chief Complaint:  Shortness of breath   Subjective:   had 900 mL uop over 7/15 charted.  Got lasix 80 mg IV yesterday x 1 dose (was reduced to daily from BID).   Down to 2 liters of oxygen.  He did get this AM's lasix dose already.  He states on oxygen at night at home - not sure how much.  He's not sure of dose or name of water pill from home.  Review of systems:   States breathing "doing great"; "no trouble" Denies n/v Denies chest pain ----------- Background on consult:  SHAKEEL DISNEY is an 54 y.o. male with a PMH significant for COPD, HTN, schizoaffective disorder, HLD, depression, DM type 2, tobacco abuse, CAD, and CKD stage IV who presented to [facility] ED with a 1 week history of worsening SOB, orthopnea, lower extremity edema, and increasing abdominal girth.  In the ED he was found to have hypoxia and was placed on BiPap.  CXR revealed stable interstitial prominence consistent with scarring and fibrosis, without acute airspace disease.  He was admitted and started on IV Lasix.  We were consulted due to the development of AKI/CKD stage IV.  The trend in Scr is seen below.  Of note, he is followed by Dr. Theador Hawthorne who feels that his progressive CKD is due to DM and HTN and was seen on 10/09/19 and was to have vascular access placement, however his caregiver cancelled the appointment.  His Scr was 4.45 on 10/02/19 and Hgb 11.9.   Intake/Output Summary (Last 24 hours) at 10/18/2019 0912 Last data filed at 10/17/2019 1600 Gross per 24 hour  Intake 124.92 ml  Output 900 ml  Net -775.08 ml    Vitals:  Vitals:   10/17/19 2024 10/17/19 2225 10/18/19 0500 10/18/19 0604  BP:  (!) 136/51  (!) 147/59  Pulse:  78  79  Resp:  20  18  Temp:  99.1 F (37.3 C)  98.7 F (37.1 C)  TempSrc:  Oral    SpO2: 98% 96%  99%  Weight:   109.2 kg   Height:         Physical Exam:  General adult male in  bed in no acute distress HEENT normocephalic atraumatic extraocular movements intact sclera anicteric Neck supple trachea midline Lungs clear to auscultation bilaterally normal work of breathing at rest on 2 liters oxygen Heart S1S2 no rub Abdomen soft nontender obese/distended (states baseline) Extremities no to trace edema  GU - no foley    Medications reviewed   Labs:  BMP Latest Ref Rng & Units 10/18/2019 10/17/2019 10/16/2019  Glucose 70 - 99 mg/dL 83 95 103(H)  BUN 6 - 20 mg/dL 52(H) 52(H) 49(H)  Creatinine 0.61 - 1.24 mg/dL 5.99(H) 5.92(H) 5.90(H)  BUN/Creat Ratio 6 - 22 (calc) - - -  Sodium 135 - 145 mmol/L 144 138 137  Potassium 3.5 - 5.1 mmol/L 3.7 3.9 4.2  Chloride 98 - 111 mmol/L 101 103 100  CO2 22 - 32 mmol/L $RemoveB'26 25 25  'slDfgCRi$ Calcium 8.9 - 10.3 mg/dL 9.4 8.7(L) 8.9     Assessment/Plan:   1.  AKI/CKD stage IV- in setting of acute volume overload/CHF possibly cardiorenal syndrome.  No uremic symptoms or indication for dialysis at this time 1. Discontinued IV lasix daily  2. Asked him to save his urine.  Strict ins/outs ordered. 3. Ordered lasix 60 mg PO daily to start  7/17 (home dose reported as 40 mg daily - pt cannot verify) 2. Acute on chronic hypoxic respiratory failure due to fluid overload.  Improved with IV lasix - titrate as above  3. CHF- mainly right-sided.  Improving s/p IV lasix 4. HTN- acceptable 5. CAD- stable 6. CKD stage IV-V- presumably due to DM/HTN +/- lithium.  He has had progressive decline in renal function over the past 3 months.  Dr. Theador Hawthorne has discussed the eventual need for dialysis and referred him for vascular access, however the appointment was cancelled by his caretaker for unclear reasons.  No indication for dialysis at this time.  He will need to reschedule AVF/AVG placement after discharge.  Hopefully he can avoid initiating HD in the hospital. 7. Schizoaffective disorder- on haldol 8. Anemia of CKD stage IV- iron deficiency - feraheme on 7/15  and s/p aranesp 40 mcg on 7/15    Claudia Desanctis, MD 10/18/2019 9:24 AM

## 2019-10-19 DIAGNOSIS — N170 Acute kidney failure with tubular necrosis: Secondary | ICD-10-CM

## 2019-10-19 DIAGNOSIS — N181 Chronic kidney disease, stage 1: Secondary | ICD-10-CM

## 2019-10-19 LAB — RENAL FUNCTION PANEL
Albumin: 2.8 g/dL — ABNORMAL LOW (ref 3.5–5.0)
Anion gap: 10 (ref 5–15)
BUN: 54 mg/dL — ABNORMAL HIGH (ref 6–20)
CO2: 28 mmol/L (ref 22–32)
Calcium: 8.9 mg/dL (ref 8.9–10.3)
Chloride: 104 mmol/L (ref 98–111)
Creatinine, Ser: 5.76 mg/dL — ABNORMAL HIGH (ref 0.61–1.24)
GFR calc Af Amer: 12 mL/min — ABNORMAL LOW (ref >60)
GFR calc non Af Amer: 10 mL/min — ABNORMAL LOW (ref >60)
Glucose, Bld: 84 mg/dL (ref 70–99)
Phosphorus: 5.1 mg/dL — ABNORMAL HIGH (ref 2.5–4.6)
Potassium: 3.9 mmol/L (ref 3.5–5.1)
Sodium: 142 mmol/L (ref 135–145)

## 2019-10-19 NOTE — NC FL2 (Signed)
[place] LEVEL OF CARE SCREENING TOOL     IDENTIFICATION  Patient Name: [name] Birthdate: [birth year] Sex: male Admission Date (Current Location): 10/15/2019  [place] and [place] Number:  [facility] [id number] [place] and Address:  [place] [address] [place]      Provider Number: [id number]  Attending Physician Name and Address:  Mendel Corning, MD  Relative Name and Phone Number:  De Blanch (owner of [employer]) Ph: [phone]    Current Level of Care: Hospital Recommended Level of Care: [place] ([place]) Prior Approval Number:    Date Approved/Denied:   PASRR Number:    Discharge Plan: Other (Comment) ([place])    Current Diagnoses: Patient Active Problem List   Diagnosis Date Noted  . Volume overload 10/16/2019  . Acute on chronic respiratory failure with hypoxia ([employer]) 10/16/2019  . Acute renal failure superimposed on stage 5 chronic kidney disease, not on chronic dialysis ([place]) 10/16/2019  . Acute hypoxemic respiratory failure ([place]) 07/28/2019  . Elevated troponin 07/26/2019  . Vomiting 10/02/2018  . Acute on chronic renal failure ([place]) 02/06/2018  . Schizoaffective disorder, bipolar type ([place]) 01/04/2016  . Acute renal failure ([place]) 12/17/2012  . Dehydration 12/17/2012  . Diarrhea 12/17/2012  . Gastroenteritis 12/17/2012  . GERD (gastroesophageal reflux disease) 12/17/2012  . Metabolic acidosis 94/50/3888  . Chronic kidney disease, stage 2, mildly decreased GFR 03/19/2012  . CAD (coronary artery disease)   . Schizophrenia ([place])   . Tobacco abuse   . Diabetes mellitus, type 2 ([place])   . Hypertension   . Hyperlipidemia   . Aortic insufficiency     Orientation RESPIRATION BLADDER Height & Weight     Self, Time, Situation, Place  O2 Continent Weight: 240 lb 11.9 oz (109.2 kg) Height:  6' (182.9 cm)  BEHAVIORAL SYMPTOMS/MOOD NEUROLOGICAL BOWEL  NUTRITION STATUS      Continent Diet (Carb modified)  AMBULATORY STATUS COMMUNICATION OF NEEDS Skin   Supervision Verbally Normal                       Personal Care Assistance Level of Assistance  Bathing, Feeding, Dressing Bathing Assistance: Limited assistance Feeding assistance: Independent Dressing Assistance: Limited assistance     Functional Limitations Info  Sight, Hearing, Speech Sight Info: Adequate Hearing Info: Adequate Speech Info: Adequate    SPECIAL CARE FACTORS FREQUENCY                       Contractures Contractures Info: Not present    Additional Factors Info  Code Status, Allergies Code Status Info: Full Allergies Info: NKA           Current Medications (10/19/2019):  This is the current hospital active medication list Current Facility-Administered Medications  Medication Dose Route Frequency Provider Last Rate Last Admin  . acetaminophen (TYLENOL) tablet 650 mg  650 mg Oral Q6H PRN Reubin Milan, MD       Or  . acetaminophen (TYLENOL) suppository 650 mg  650 mg Rectal Q6H PRN Reubin Milan, MD      . aspirin EC tablet 81 mg  81 mg Oral Q breakfast Reubin Milan, MD   81 mg at 10/19/19 2800  . carvedilol (COREG) tablet 12.5 mg  12.5 mg Oral BID WC Orson Eva, MD   12.5 mg at 10/19/19 0923  . cholecalciferol (VITAMIN D3) tablet 1,000 Units  1,000  Units Oral Daily Bobette Mo, MD   1,000 Units at 10/19/19 [id number]  . diclofenac sodium (VOLTAREN) 1 % transdermal gel 2 g  2 g Topical QID Bobette Mo, MD   2 g at 10/19/19 0925  . feeding supplement (NEPRO CARB STEADY) liquid 237 mL  237 mL Oral Q24H Tat, David, MD      . furosemide (LASIX) tablet 60 mg  60 mg Oral Daily Estanislado Emms, MD   60 mg at 10/19/19 0148  . heparin injection 5,000 Units  5,000 Units Subcutaneous Andres Labrum, MD   5,000 Units at 10/19/19 0516  . hydrALAZINE (APRESOLINE) tablet 100 mg  100 mg Oral TID Bobette Mo, MD   100 mg at  10/19/19 4039  . ipratropium-albuterol (DUONEB) 0.5-2.5 (3) MG/3ML nebulizer solution 3 mL  3 mL Nebulization TID Catarina Hartshorn, MD   3 mL at 10/19/19 0727  . isosorbide mononitrate (IMDUR) 24 hr tablet 30 mg  30 mg Oral Daily Bobette Mo, MD   30 mg at 10/19/19 0924  . loperamide (IMODIUM) capsule 2 mg  2 mg Oral QID PRN Bobette Mo, MD      . omega-3 acid ethyl esters (LOVAZA) capsule 1 g  1 g Oral TID Catarina Hartshorn, MD   1 g at 10/19/19 0925  . ondansetron (ZOFRAN) tablet 4 mg  4 mg Oral Q6H PRN Bobette Mo, MD       Or  . ondansetron [facility]) injection 4 mg  4 mg Intravenous Q6H PRN Bobette Mo, MD      . pantoprazole (PROTONIX) EC tablet 40 mg  40 mg Oral Daily Bobette Mo, MD   40 mg at 10/19/19 7953  . pravastatin (PRAVACHOL) tablet 40 mg  40 mg Oral QHS Bobette Mo, MD   40 mg at 10/18/19 2055  . sodium bicarbonate tablet 650 mg  650 mg Oral TID Bobette Mo, MD   650 mg at 10/19/19 0924  . sucralfate (CARAFATE) 1 GM/10ML suspension 1 g  1 g Oral QID Bobette Mo, MD   1 g at 10/19/19 6922     Discharge Medications: Allergies as of 10/19/2019   No Known Allergies        Medication List    STOP taking these medications   insulin glargine 100 UNIT/ML injection Commonly known as: LANTUS   minoxidil 10 MG tablet Commonly known as: LONITEN   NIFEdipine 60 MG 24 hr tablet Commonly known as: ADALAT CC   potassium chloride 10 MEQ tablet Commonly known as: KLOR-CON     TAKE these medications   acetaminophen 500 MG tablet Commonly known as: TYLENOL Take 1 tablet (500 mg total) by mouth every 6 (six) hours as needed.   albuterol 108 (90 Base) MCG/ACT inhaler Commonly known as: VENTOLIN HFA Inhale 2 puffs into the lungs every 6 (six) hours as needed for wheezing or shortness of breath.   aspirin EC 81 MG tablet Take 1 tablet (81 mg total) by mouth daily with breakfast.   carvedilol 12.5 MG tablet Commonly  known as: COREG Take 12.5 mg by mouth 2 (two) times daily with a meal.   cholecalciferol 25 MCG (1000 UNIT) tablet Commonly known as: VITAMIN D3 Take 1,000 Units by mouth daily.   DAILY VITE PO Take 1 tablet by mouth every morning.   diclofenac sodium 1 % Gel Commonly known as: VOLTAREN Apply 2 g topically 4 (four) times daily.  fish oil-omega-3 fatty acids 1000 MG capsule Take 1 g by mouth in the morning, at noon, and at bedtime.   furosemide 20 MG tablet Commonly known as: LASIX Take 3 tablets (60 mg total) by mouth daily. What changed:   medication strength  how much to take  additional instructions   haloperidol decanoate 100 MG/ML injection Commonly known as: HALDOL DECANOATE Inject 100 mg into the muscle every 14 (fourteen) days.   hydrALAZINE 100 MG tablet Commonly known as: APRESOLINE Take 1 tablet (100 mg total) by mouth 3 (three) times daily.   isosorbide mononitrate 30 MG 24 hr tablet Commonly known as: IMDUR Take 1 tablet (30 mg total) by mouth daily.   nitroGLYCERIN 0.4 MG SL tablet Commonly known as: NITROSTAT Place 0.4 mg under the tongue every 5 (five) minutes as needed for chest pain.   omeprazole 20 MG capsule Commonly known as: PRILOSEC Take 1 capsule (20 mg total) by mouth daily.   pravastatin 40 MG tablet Commonly known as: PRAVACHOL Take 1 tablet (40 mg total) by mouth at bedtime.   QC Anti-Diarrheal 2 MG tablet Generic drug: loperamide Take 2 mg by mouth 4 (four) times daily as needed for diarrhea or loose stools.   sodium bicarbonate 650 MG tablet Take 650 mg by mouth 3 (three) times daily.   traZODone 50 MG tablet Commonly known as: DESYREL Take 50 mg by mouth at bedtime.     Relevant Imaging Results:  Relevant Lab Results:   Additional Information SSN: [id number]  Natasha Bence, LCSW

## 2019-10-19 NOTE — Discharge Summary (Signed)
Physician Discharge Summary   Patient ID: [name] MRN: [id number] DOB/AGE: 08-23-1965 54 y.o.  Admit date: 10/15/2019 Discharge date: 10/19/2019  Primary Care Physician:  Rosita Fire, MD   Recommendations for Outpatient Follow-up:  1. Follow up with PCP in 1-2 weeks 2. Please obtain BMP in one week  Home Health: None  Equipment/Devices:   Discharge Condition: stable  CODE STATUS: FULL Diet recommendation: Carb modified diet   Discharge Diagnoses:    Acute on chronic respiratory failure with hypoxia . Volume overload . CAD (coronary artery disease) . Acute on chronic renal failure ([place]) . GERD (gastroesophageal reflux disease) . Hyperlipidemia . Hypertension . Elevated troponin . Schizoaffective disorder, bipolar type ([place]) . Acute renal failure superimposed on stage 5 chronic kidney disease, not on chronic dialysis [facility]) Generalized debility  Consults: Nephrology    Allergies:  No Known Allergies   DISCHARGE MEDICATIONS: Allergies as of 10/19/2019   No Known Allergies     Medication List    STOP taking these medications   insulin glargine 100 UNIT/ML injection Commonly known as: LANTUS   minoxidil 10 MG tablet Commonly known as: LONITEN   NIFEdipine 60 MG 24 hr tablet Commonly known as: ADALAT CC   potassium chloride 10 MEQ tablet Commonly known as: KLOR-CON     TAKE these medications   acetaminophen 500 MG tablet Commonly known as: TYLENOL Take 1 tablet (500 mg total) by mouth every 6 (six) hours as needed.   albuterol 108 (90 Base) MCG/ACT inhaler Commonly known as: VENTOLIN HFA Inhale 2 puffs into the lungs every 6 (six) hours as needed for wheezing or shortness of breath.   aspirin EC 81 MG tablet Take 1 tablet (81 mg total) by mouth daily with breakfast.   carvedilol 12.5 MG tablet Commonly known as: COREG Take 12.5 mg by mouth 2 (two) times daily with a meal.   cholecalciferol 25 MCG (1000 UNIT) tablet Commonly known  as: VITAMIN D3 Take 1,000 Units by mouth daily.   DAILY VITE PO Take 1 tablet by mouth every morning.   diclofenac sodium 1 % Gel Commonly known as: VOLTAREN Apply 2 g topically 4 (four) times daily.   fish oil-omega-3 fatty acids 1000 MG capsule Take 1 g by mouth in the morning, at noon, and at bedtime.   furosemide 20 MG tablet Commonly known as: LASIX Take 3 tablets (60 mg total) by mouth daily. What changed:   medication strength  how much to take  additional instructions   haloperidol decanoate 100 MG/ML injection Commonly known as: HALDOL DECANOATE Inject 100 mg into the muscle every 14 (fourteen) days.   hydrALAZINE 100 MG tablet Commonly known as: APRESOLINE Take 1 tablet (100 mg total) by mouth 3 (three) times daily.   isosorbide mononitrate 30 MG 24 hr tablet Commonly known as: IMDUR Take 1 tablet (30 mg total) by mouth daily.   nitroGLYCERIN 0.4 MG SL tablet Commonly known as: NITROSTAT Place 0.4 mg under the tongue every 5 (five) minutes as needed for chest pain.   omeprazole 20 MG capsule Commonly known as: PRILOSEC Take 1 capsule (20 mg total) by mouth daily.   pravastatin 40 MG tablet Commonly known as: PRAVACHOL Take 1 tablet (40 mg total) by mouth at bedtime.   QC Anti-Diarrheal 2 MG tablet Generic drug: loperamide Take 2 mg by mouth 4 (four) times daily as needed for diarrhea or loose stools.   sodium bicarbonate 650 MG tablet Take 650 mg by mouth 3 (three) times  daily.   traZODone 50 MG tablet Commonly known as: DESYREL Take 50 mg by mouth at bedtime.        Brief H and P: For complete details please refer to admission H and P, but in brief  54 year old male with a history of chronic respiratory failure on 2 L, COPD, CKD stage IV, hypertension, schizo affective disorder, hyperlipidemia, depression, diabetes mellitus type 2, tobacco abuse, coronary artery disease presenting with 1 week history of worsening shortness of breath,  orthopnea, and increasing abdominal girth. The patient has had 2 recent hospitalizations, one from 07/28/2019 to 07/29/2019 for acute on chronic respiratory failure secondary to COPD exacerbation. In addition, the patient was also admitted from 07/25/2019 until 07/26/2019 for chest pain. Cardiology did not feel the patient needed further work-up at that time. He denies any fevers, chills, chest pain, nausea, vomiting, diarrhea, abdominal pain. He has been complaining of increasing abdominal swelling and leg edema. He has been having orthopnea type symptoms. He denies any NSAIDs. In the emergency department, the patient was afebrile hemodynamically stable. He was placed on BiPAP secondary to hypoxia. Chest x-ray shows stable interstitial prominence and stable right lower lobe atelectasis/infiltrate. BMP showed sodium 134, serum creatinine 5.58. WBC was 10.7. The patient was started on furosemide 120 mg IV.    Hospital Course:   Acute on chronic respiratory failure with hypoxia -Secondary to volume overload, chronically on 2 L O2 [place] -Patient was placed on BiPAP in the ED, subsequently weaned down to 2 L at his baseline -Chest x-ray showed interstitial prominence consistent with scarring and fibrosis, no acute airspace disease.  Volume overload likely secondary to progression of underlying CKD stage V -Patient was placed on IV Lasix diuresis, transition to oral p.o. at the time of discharge -Nephrology was consulted. -2D echo 07/26/2019 had shown EF of 6065%, no WMA, mild to moderate AS, severe LVH -Continue Lasix 60 mg PO daily  Acute kidney injury on CKD stage V -Suspect patient likely has progression of underlying CKD, baseline creatinine 4.5-4.7 -Renal ultrasound showed medical renal disease, serum creatinine peaked at 5.9 -Patient has an appointment with vascular surgery for HD access on 10/24/2019 at 8 AM  Essential hypertension -Continue Coreg, hydralazine, Imdur -Holding  nifedipine and clonidine, follow closely with PCP and nephrology to adjust antihypertensives -Carvedilol dose increased  Coronary artery disease status post PCI to LAD in 07/2008 -Continue aspirin, Coreg, Imdur, currently no chest pain  Hyperlipidemia Continue statin  Diabetes mellitus type 2, controlled -Patient was placed on sliding scale insulin while inpatient, hemoglobin A1c 5.6 on 10/16/2019  Schizoaffective disorder -Patient on haloperidol decanoate every 14 days prior to admission  Generalized debility PT evaluation was obtained, currently at baseline, no PT follow-up      Day of Discharge S: Ambulating in the room, no acute complaints, no chest pain or shortness of breath  BP (!) 158/64 (BP Location: Left Arm)   Pulse 66   Temp 98.6 F (37 C) (Oral)   Resp 17   Ht 6' (1.829 m)   Wt 109.2 kg   SpO2 98%   BMI 32.65 kg/m   Physical Exam: General: Alert and awake oriented x3 not in any acute distress. HEENT: anicteric sclera, pupils reactive to light and accommodation CVS: S1-S2 clear no murmur rubs or gallops Chest: clear to auscultation bilaterally, no wheezing rales or rhonchi Abdomen: soft nontender, nondistended, normal bowel sounds Extremities: no cyanosis, clubbing, trace edema noted bilaterally Neuro: no new deficits    Get Medicines  reviewed and adjusted: Please take all your medications with you for your next visit with your Primary MD  Please request your Primary MD to go over all hospital tests and procedure/radiological results at the follow up. Please ask your Primary MD to get all Hospital records sent to his/her office.  If you experience worsening of your admission symptoms, develop shortness of breath, life threatening emergency, suicidal or homicidal thoughts you must seek medical attention immediately by calling 911 or calling your MD immediately  if symptoms less severe.  You must read complete instructions/literature along with all  the possible adverse reactions/side effects for all the Medicines you take and that have been prescribed to you. Take any new Medicines after you have completely understood and accept all the possible adverse reactions/side effects.   Do not drive when taking pain medications.   Do not take more than prescribed Pain, Sleep and Anxiety Medications  Special Instructions: If you have smoked or chewed Tobacco  in the last 2 yrs please stop smoking, stop any regular Alcohol  and or any Recreational drug use.  Wear Seat belts while driving.  Please note  You were cared for by a hospitalist during your hospital stay. Once you are discharged, your primary care physician will handle any further medical issues. Please note that NO REFILLS for any discharge medications will be authorized once you are discharged, as it is imperative that you return to your primary care physician (or establish a relationship with a primary care physician if you do not have one) for your aftercare needs so that they can reassess your need for medications and monitor your lab values.   The results of significant diagnostics from this hospitalization (including imaging, microbiology, ancillary and laboratory) are listed below for reference.      Procedures/Studies:  US RENAL  Result Date: 11-09-19 CLINICAL DATA:  Acute on chronic renal failure EXAM: RENAL / URINARY TRACT ULTRASOUND COMPLETE COMPARISON:  02/07/2018 FINDINGS: Right Kidney: Renal measurements: 10.6 x 5 x 6.4 cm = volume: 180 mL. Increased echogenicity. No hydronephrosis or mass Left Kidney: Renal measurements: 13 x 5 x 5 cm = volume: 175 mL. Increased echogenicity. 13 mm incidental cyst. No evidence of solid mass. Bladder: Appears normal for degree of bladder distention. IMPRESSION: Medical renal disease. No hydronephrosis or reversible finding. Electronically Signed   By: Monte Fantasia M.D.   On: 09-Nov-2019 09:11   DG Chest Port 1 View  Result Date:  10/15/2019 CLINICAL DATA:  Short of breath, hypoxia, tobacco abuse EXAM: PORTABLE CHEST 1 VIEW COMPARISON:  07/28/2019 FINDINGS: 2 frontal views of the chest demonstrate a stable cardiac silhouette. Diffuse interstitial prominence is unchanged, likely representing scarring and fibrosis. No acute airspace disease, effusion, or pneumothorax. No acute bony abnormalities. IMPRESSION: 1. Stable interstitial prominence consistent with scarring and fibrosis. 2. No acute airspace disease. Electronically Signed   By: Randa Ngo M.D.   On: 10/15/2019 23:39       LAB RESULTS: Basic Metabolic Panel: Recent Labs  Lab 10/18/19 0606 10/18/19 0606 10/19/19 0639  NA 144  --  142  K 3.7  --  3.9  CL 101  --  104  CO2 26  --  28  GLUCOSE 83  --  84  BUN 52*  --  54*  CREATININE 5.99*  --  5.76*  CALCIUM 9.4  --  8.9  PHOS 5.2*   < > 5.1*   < > = values in this interval not displayed.  Liver Function Tests: Recent Labs  Lab 10/15/19 2327 10/16/19 0813 10/18/19 0606 10/19/19 0639  AST 16  --   --   --   ALT 15  --   --   --   ALKPHOS 109  --   --   --   BILITOT 0.6  --   --   --   PROT 7.2  --   --   --   ALBUMIN 3.4*   < > 2.8* 2.8*   < > = values in this interval not displayed.   No results for input(s): LIPASE, AMYLASE in the last 168 hours. No results for input(s): AMMONIA in the last 168 hours. CBC: Recent Labs  Lab 10/15/19 2327 10/15/19 2327 10/17/19 0551  WBC 10.7*  --  5.9  NEUTROABS 9.4*  --   --   HGB 8.4*  --  7.6*  HCT 26.5*  --  24.7*  MCV 84.1   < > 84.6  PLT 172  --  164   < > = values in this interval not displayed.   Cardiac Enzymes: No results for input(s): CKTOTAL, CKMB, CKMBINDEX, TROPONINI in the last 168 hours. BNP: Invalid input(s): POCBNP CBG: Recent Labs  Lab 10/17/19 1158 10/17/19 2229  GLUCAP 145* 95       Disposition and Follow-up: Discharge Instructions    (HEART FAILURE PATIENTS) Call MD:  Anytime you have any of the following  symptoms: 1) 3 pound weight gain in 24 hours or 5 pounds in 1 week 2) shortness of breath, with or without a dry hacking cough 3) swelling in the hands, feet or stomach 4) if you have to sleep on extra pillows at night in order to breathe.   Complete by: As directed    Diet - low sodium heart healthy   Complete by: As directed    Increase activity slowly   Complete by: As directed        DISPOSITION: Group home   DISCHARGE FOLLOW-UP  Follow-up Information    Rosita Fire, MD. Schedule an appointment as soon as possible for a visit in 2 week(s).   Specialty: Internal Medicine Contact information: [place] [address] [phone]        Herminio Commons, MD. Schedule an appointment as soon as possible for a visit in 2 week(s).   Specialty: Cardiology Contact information: [place] [address] [phone]                Time coordinating discharge:  35 minutes  Signed:   Estill Cotta M.D. [facility] 10/19/2019, 12:05 PM

## 2019-10-19 NOTE — Progress Notes (Signed)
Nsg Discharge Note  Admit Date:  10/15/2019 Discharge date: 10/19/2019   Gasper Sells to be D/C'd  Back to [facility]  per MD order.  AVS completed.  Copy for chart, and copy for patient signed, and dated. [facility] called and notified that patient is being discharged.  Patient/caregiver able to verbalize understanding.  Discharge Medication: Allergies as of 10/19/2019   No Known Allergies     Medication List    STOP taking these medications   insulin glargine 100 UNIT/ML injection Commonly known as: LANTUS   minoxidil 10 MG tablet Commonly known as: LONITEN   NIFEdipine 60 MG 24 hr tablet Commonly known as: ADALAT CC   potassium chloride 10 MEQ tablet Commonly known as: KLOR-CON     TAKE these medications   acetaminophen 500 MG tablet Commonly known as: TYLENOL Take 1 tablet (500 mg total) by mouth every 6 (six) hours as needed.   albuterol 108 (90 Base) MCG/ACT inhaler Commonly known as: VENTOLIN HFA Inhale 2 puffs into the lungs every 6 (six) hours as needed for wheezing or shortness of breath.   aspirin EC 81 MG tablet Take 1 tablet (81 mg total) by mouth daily with breakfast.   carvedilol 12.5 MG tablet Commonly known as: COREG Take 12.5 mg by mouth 2 (two) times daily with a meal.   cholecalciferol 25 MCG (1000 UNIT) tablet Commonly known as: VITAMIN D3 Take 1,000 Units by mouth daily.   DAILY VITE PO Take 1 tablet by mouth every morning.   diclofenac sodium 1 % Gel Commonly known as: VOLTAREN Apply 2 g topically 4 (four) times daily.   fish oil-omega-3 fatty acids 1000 MG capsule Take 1 g by mouth in the morning, at noon, and at bedtime.   furosemide 20 MG tablet Commonly known as: LASIX Take 3 tablets (60 mg total) by mouth daily. What changed:   medication strength  how much to take  additional instructions   haloperidol decanoate 100 MG/ML injection Commonly known as: HALDOL DECANOATE Inject 100 mg into the muscle  every 14 (fourteen) days.   hydrALAZINE 100 MG tablet Commonly known as: APRESOLINE Take 1 tablet (100 mg total) by mouth 3 (three) times daily.   isosorbide mononitrate 30 MG 24 hr tablet Commonly known as: IMDUR Take 1 tablet (30 mg total) by mouth daily.   nitroGLYCERIN 0.4 MG SL tablet Commonly known as: NITROSTAT Place 0.4 mg under the tongue every 5 (five) minutes as needed for chest pain.   omeprazole 20 MG capsule Commonly known as: PRILOSEC Take 1 capsule (20 mg total) by mouth daily.   pravastatin 40 MG tablet Commonly known as: PRAVACHOL Take 1 tablet (40 mg total) by mouth at bedtime.   QC Anti-Diarrheal 2 MG tablet Generic drug: loperamide Take 2 mg by mouth 4 (four) times daily as needed for diarrhea or loose stools.   sodium bicarbonate 650 MG tablet Take 650 mg by mouth 3 (three) times daily.   traZODone 50 MG tablet Commonly known as: DESYREL Take 50 mg by mouth at bedtime.       Discharge Assessment: Vitals:   10/19/19 0922 10/19/19 1259  BP: (!) 158/64 (!) 165/67  Pulse: 66 (!) 55  Resp:  20  Temp:  98.7 F (37.1 C)  SpO2: 98% 100%   Skin clean, dry and intact without evidence of skin break down, no evidence of skin tears noted. IV catheter discontinued intact. Site without signs and symptoms of complications -  no redness or edema noted at insertion site, patient denies c/o pain - only slight tenderness at site.  Dressing with slight pressure applied.  D/c Instructions-Education: Discharge instructions given to patient/family with verbalized understanding. D/c education completed with patient/family including follow up instructions, medication list, d/c activities limitations if indicated, with other d/c instructions as indicated by MD - patient able to verbalize understanding, all questions fully answered. Patient instructed to return to ED, call 911, or call MD for any changes in condition.  Patient escorted via [place], and D/C home via private  auto.  Zenaida Deed, RN 10/19/2019 1:29 PM

## 2019-10-20 LAB — URINE CULTURE

## 2019-10-24 ENCOUNTER — Encounter: Payer: Self-pay | Admitting: Vascular Surgery

## 2019-10-24 ENCOUNTER — Other Ambulatory Visit: Payer: Self-pay

## 2019-10-24 ENCOUNTER — Ambulatory Visit (INDEPENDENT_AMBULATORY_CARE_PROVIDER_SITE_OTHER): Payer: Medicaid Other | Admitting: Vascular Surgery

## 2019-10-24 ENCOUNTER — Ambulatory Visit (HOSPITAL_COMMUNITY)
Admission: RE | Admit: 2019-10-24 | Discharge: 2019-10-24 | Disposition: A | Discharge status: Home / Self Care | Payer: Medicaid Other | Source: Ambulatory Visit | Attending: Vascular Surgery | Admitting: Vascular Surgery

## 2019-10-24 ENCOUNTER — Ambulatory Visit (INDEPENDENT_AMBULATORY_CARE_PROVIDER_SITE_OTHER)
Admission: RE | Admit: 2019-10-24 | Discharge: 2019-10-24 | Disposition: A | Discharge status: Home / Self Care | Payer: Medicaid Other | Source: Ambulatory Visit | Attending: Vascular Surgery | Admitting: Vascular Surgery

## 2019-10-24 VITALS — BP 147/70 | HR 73 | Temp 97.8°F | Resp 20 | Ht 72.0 in | Wt 247.0 lb

## 2019-10-24 DIAGNOSIS — N182 Chronic kidney disease, stage 2 (mild): Secondary | ICD-10-CM | POA: Diagnosis not present

## 2019-10-24 DIAGNOSIS — N184 Chronic kidney disease, stage 4 (severe): Secondary | ICD-10-CM | POA: Diagnosis not present

## 2019-10-24 NOTE — Progress Notes (Signed)
Referring Physician: Dr Theador Hawthorne  Patient name: [name] MRN: [id number] DOB: 12-30-1965 Sex: male  REASON FOR CONSULT: Hemodialysis access  HPI: [name] is a 54 y.o. male, referred for placement of a hemodialysis access.  He is currently CKD 4.  He is GFR 16.  He is right-handed.  He has not had any prior access procedures.  Other medical problems include diabetes, hypertension, hyperlipidemia.  He also has schizophrenia bipolar disease with mild mental retardation.  His caseworker from his group home was present today and says that he signed consent forms with his family members input.  Past Medical History:  Diagnosis Date  . Aortic insufficiency    mild to moderate   . Arteriosclerotic cardiovascular disease (ASCVD)    EF 60% -- Non-Q MI in 07/2008->  BMS to mid LAD  . Axillary abscess 2011   right  . Cerebrovascular disease    CVA  . Chronic obstructive pulmonary disease ([place])   . Diabetes mellitus type II   . Diabetes mellitus, type 2 (HCC)    A1c of 5.6 in 05/2011  . Hyperlipidemia   . Hypertension   . Mild anemia    Result.  Nl H&H in 02/2011  . Obesity   . Schizophrenia ([place])    Schizophrenia/bipolar disease/mental retardation  . Sinus bradycardia    On low dose beta blocker  . Tobacco abuse    15 pack years; 0.5 pack per day   Past Surgical History:  Procedure Laterality Date  . HERNIA REPAIR  1967    Family History  Problem Relation Age of Onset  . Hypertension Other   . Arthritis Other     SOCIAL HISTORY: Social History   Socioeconomic History  . Marital status: Single    Spouse name: Not on file  . Number of children: Not on file  . Years of education: Not on file  . Highest education level: Not on file  Occupational History  . Occupation: Disabled  Tobacco Use  . Smoking status: Current Every Day Smoker    Packs/day: 1.00    Years: 30.00    Pack years: 30.00    Types: Cigarettes    Start date: 12/03/1974  . Smokeless  tobacco: Never Used  . Tobacco comment: Patient is not motivated to quit.  He attributes his smoking to the lack of other activities.  Vaping Use  . Vaping Use: Never used  Substance and Sexual Activity  . Alcohol use: No    Alcohol/week: 0.0 standard drinks  . Drug use: No  . Sexual activity: Never  Other Topics Concern  . Not on file  Social History Narrative   Resides in an assisted-living facility   Social Determinants of Health   Financial Resource Strain:   . Difficulty of Paying Living Expenses:   Food Insecurity:   . Worried About Charity fundraiser in the Last Year:   . Arboriculturist in the Last Year:   Transportation Needs:   . Film/video editor (Medical):   Marland Kitchen Lack of Transportation (Non-Medical):   Physical Activity:   . Days of Exercise per Week:   . Minutes of Exercise per Session:   Stress:   . Feeling of Stress :   Social Connections:   . Frequency of Communication with Friends and Family:   . Frequency of Social Gatherings with Friends and Family:   . Attends Religious Services:   . Active Member of Clubs or Organizations:   .  Attends Archivist Meetings:   Marland Kitchen Marital Status:   Intimate Partner Violence:   . Fear of Current or Ex-Partner:   . Emotionally Abused:   Marland Kitchen Physically Abused:   . Sexually Abused:     No Known Allergies  Current Outpatient Medications  Medication Sig Dispense Refill  . acetaminophen (TYLENOL) 500 MG tablet Take 1 tablet (500 mg total) by mouth every 6 (six) hours as needed. 30 tablet 0  . albuterol (VENTOLIN HFA) 108 (90 Base) MCG/ACT inhaler Inhale 2 puffs into the lungs every 6 (six) hours as needed for wheezing or shortness of breath. 18 g 3  . aspirin EC 81 MG tablet Take 1 tablet (81 mg total) by mouth daily with breakfast. 30 tablet 3  . carvedilol (COREG) 12.5 MG tablet Take 12.5 mg by mouth 2 (two) times daily with a meal.    . cholecalciferol (VITAMIN D3) 25 MCG (1000 UNIT) tablet Take 1,000 Units by  mouth daily.    . diclofenac sodium (VOLTAREN) 1 % GEL Apply 2 g topically 4 (four) times daily.    . fish oil-omega-3 fatty acids 1000 MG capsule Take 1 g by mouth in the morning, at noon, and at bedtime.     . furosemide (LASIX) 20 MG tablet Take 3 tablets (60 mg total) by mouth daily. 90 tablet 1  . haloperidol decanoate (HALDOL DECANOATE) 100 MG/ML injection Inject 100 mg into the muscle every 14 (fourteen) days.      . hydrALAZINE (APRESOLINE) 100 MG tablet Take 1 tablet (100 mg total) by mouth 3 (three) times daily. 90 tablet 5  . isosorbide mononitrate (IMDUR) 30 MG 24 hr tablet Take 1 tablet (30 mg total) by mouth daily. 30 tablet 11  . loperamide (QC ANTI-DIARRHEAL) 2 MG tablet Take 2 mg by mouth 4 (four) times daily as needed for diarrhea or loose stools.    . Multiple Vitamin (DAILY VITE PO) Take 1 tablet by mouth every morning.     . nitroGLYCERIN (NITROSTAT) 0.4 MG SL tablet Place 0.4 mg under the tongue every 5 (five) minutes as needed for chest pain.    Marland Kitchen omeprazole (PRILOSEC) 20 MG capsule Take 1 capsule (20 mg total) by mouth daily. 30 capsule 0  . pravastatin (PRAVACHOL) 40 MG tablet Take 1 tablet (40 mg total) by mouth at bedtime. 30 tablet 5  . sodium bicarbonate 650 MG tablet Take 650 mg by mouth 3 (three) times daily.    . traZODone (DESYREL) 50 MG tablet Take 50 mg by mouth at bedtime.     No current facility-administered medications for this visit.    ROS:   General:  No weight loss, Fever, chills  HEENT: No recent headaches, no nasal bleeding, no visual changes, no sore throat  Neurologic: No dizziness, blackouts, seizures. No recent symptoms of stroke or mini- stroke. No recent episodes of slurred speech, or temporary blindness.  Cardiac: No recent episodes of chest pain/pressure, no shortness of breath at rest.  No shortness of breath with exertion.  Denies history of atrial fibrillation or irregular heartbeat  Vascular: No history of rest pain in feet.  No  history of claudication.  No history of non-healing ulcer, No history of DVT   Pulmonary: No home oxygen, no productive cough, no hemoptysis,  No asthma or wheezing  Musculoskeletal:  $RemoveBeforeDEI'[ ]'[id number]$  Arthritis, $RemoveBef'[ ]'KYEQhbsqgu$  Low back pain,  $Remo'[ ]'CXEUJ$  Joint pain  Hematologic:No history of hypercoagulable state.  No history of easy bleeding.  No  history of anemia  Gastrointestinal: No hematochezia or melena,  No gastroesophageal reflux, no trouble swallowing  Urinary: [X]  chronic Kidney disease, [ ]  on HD - [ ]  MWF or [ ]  TTHS, [ ]  Burning with urination, [ ]  Frequent urination, [ ]  Difficulty urinating;   Skin: No rashes  Psychological: No history of anxiety,  No history of depression   Physical Examination  Vitals:   10/24/19 1010  BP: (!) 147/70  Pulse: 73  Resp: 20  Temp: 97.8 F (36.6 C)  SpO2: 93%  Weight: 247 lb (112 kg)  Height: 6' (1.829 m)    Body mass index is 33.5 kg/m.  General:  Alert and oriented, no acute distress HEENT: Normal Neck: No JVD Pulmonary: Clear to auscultation bilaterally Cardiac: Regular Rate and Rhythm  Skin: No rash Extremity Pulses:  2+ radial, brachial  pulses bilaterally Musculoskeletal: No deformity or edema  Neurologic: Upper and lower extremity motor 5/5 and symmetric  DATA:  Patient had a vein mapping ultrasound today which shows vein is less than 2 mm in diameter diffusely throughout both arms.  He had an upper extremity arterial duplex exam which showed normal arterial anatomy.  I reviewed and interpreted both the studies.  ASSESSMENT: Patient would be a candidate for a left upper arm AV graft.  He has no adequate vein for creation of a fistula.  He is not currently on hemodialysis.   PLAN: That we are not placing a graft that is being maintained and not used I would prefer to wait to place his graft until dialysis is fairly Administrator, arts.  I will forward a copy of my note to Dr. Theador Hawthorne today and he will let us know when he wished to have a left upper arm AV  graft placed.  Risk benefits possible complications of procedure details were discussed with the patient and his group home caregiver today.  They understand and agree to proceed when Dr. Theador Hawthorne wishes to proceed.   Ruta Hinds, MD [facility] Office: [phone]

## 2019-11-03 DEATH — deceased

## 2021-03-23 IMAGING — DX DG CHEST 1V PORT
1 series · 2 of 2 positions shown · non-contrast
Comparison: 07/28/2019

CLINICAL DATA: Short of breath, hypoxia, tobacco abuse

EXAM:
PORTABLE CHEST 1 VIEW

[Series 2: chest ap grid · 0.14mm/px · 2 of 2 slices shown]
[im 1/2]
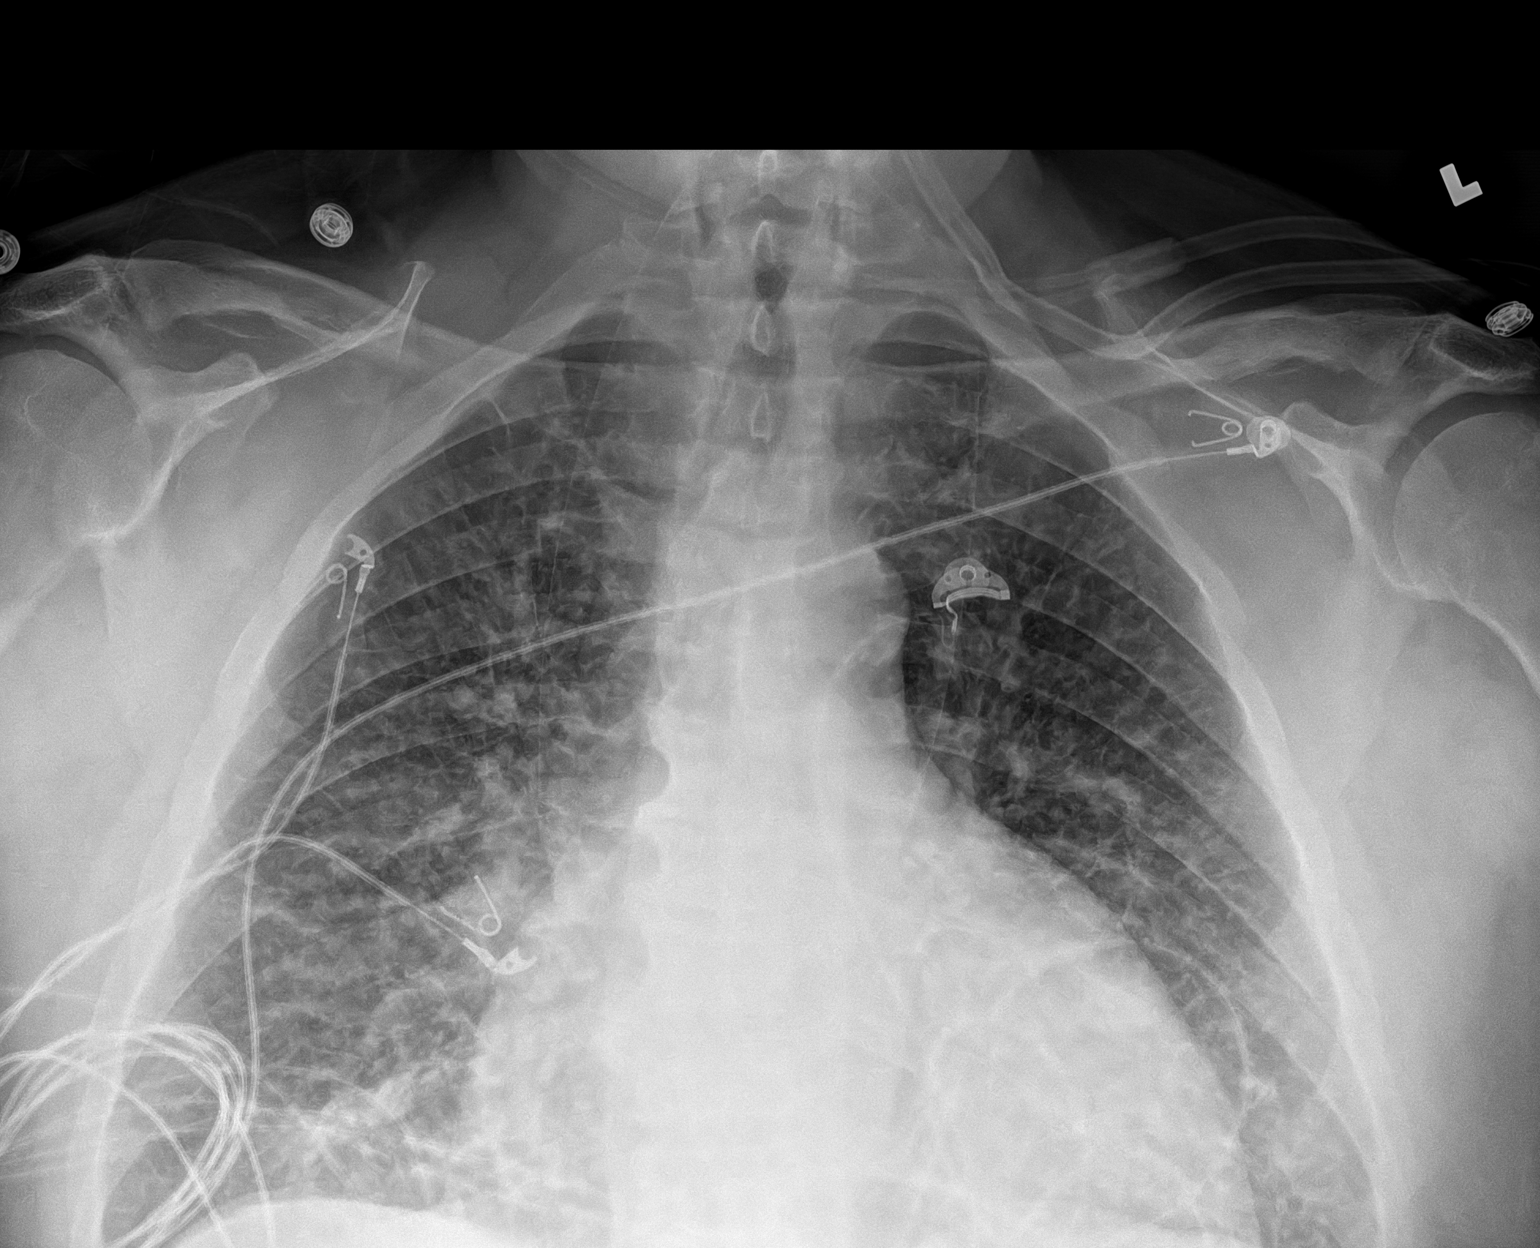
[im 2/2]
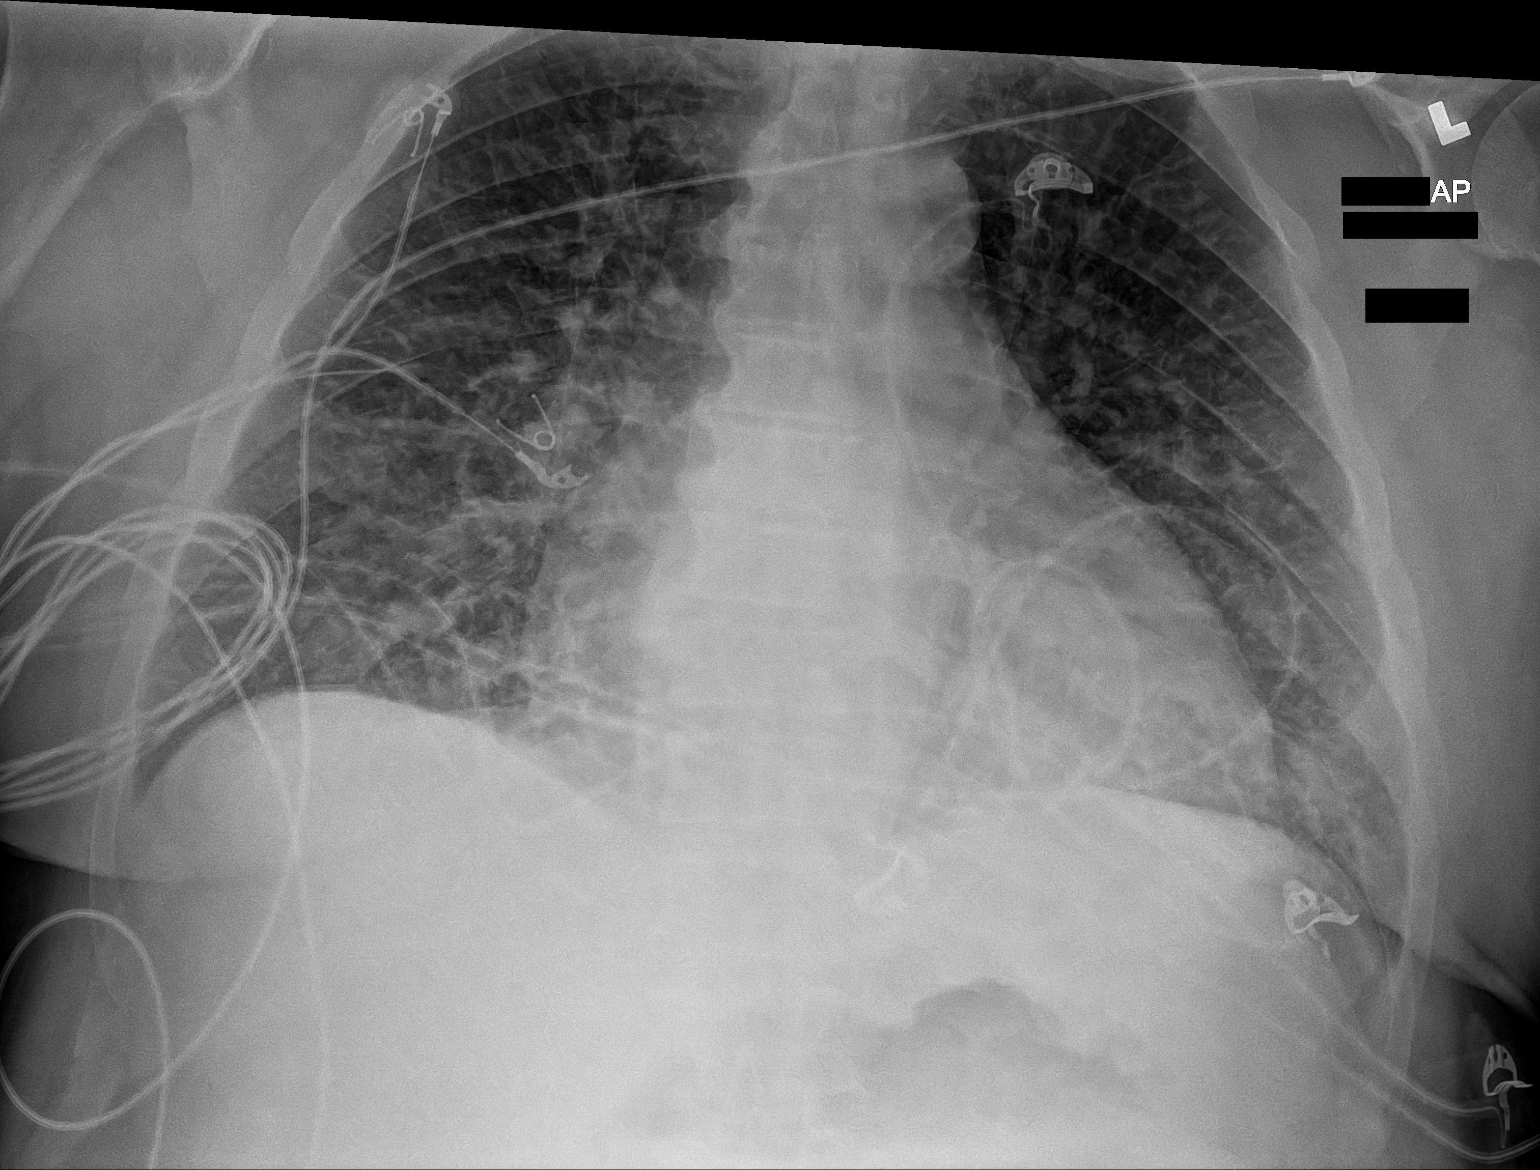

[2 of 2 positions shown; findings below may reference images not displayed]

FINDINGS: 2 frontal views of the chest demonstrate a stable cardiac
silhouette. Diffuse interstitial prominence is unchanged, likely
representing scarring and fibrosis. No acute airspace disease,
effusion, or pneumothorax. No acute bony abnormalities.
IMPRESSION: 1. Stable interstitial prominence consistent with scarring and
fibrosis.
2. No acute airspace disease.

## 2021-03-24 IMAGING — US US RENAL
1 series · 14 of 25 positions shown · non-contrast
Comparison: 02/07/2018

CLINICAL DATA: Acute on chronic renal failure

EXAM:
RENAL / URINARY TRACT ULTRASOUND COMPLETE

[Series 1: us renal · 14 of 43 slices shown]
[im 1/43]
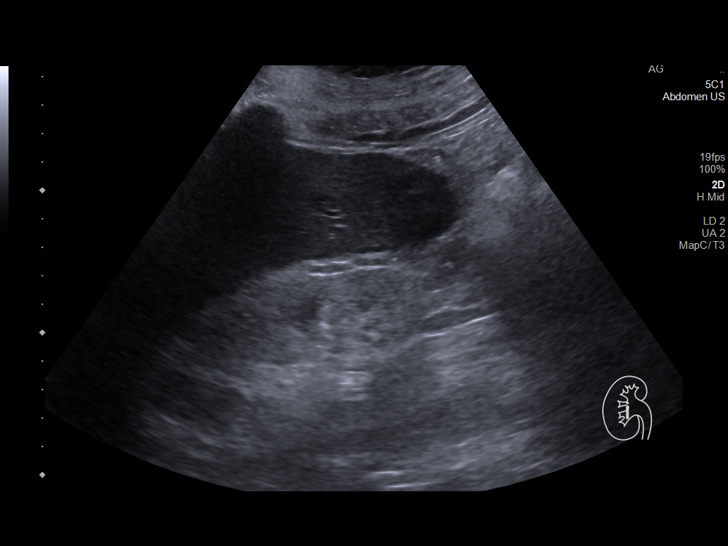
[im 4/43]
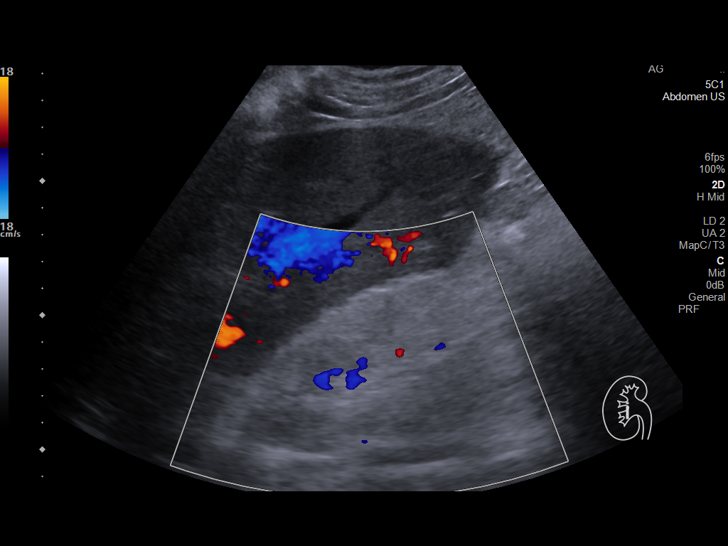
[im 8/43]
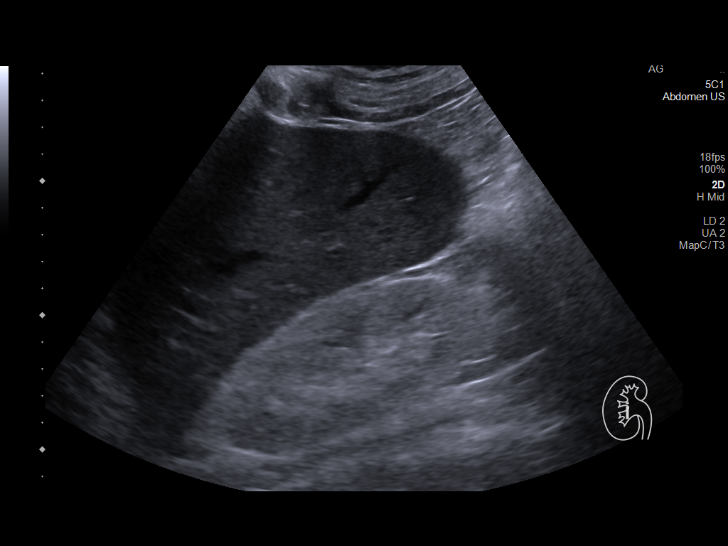
[im 11/43]
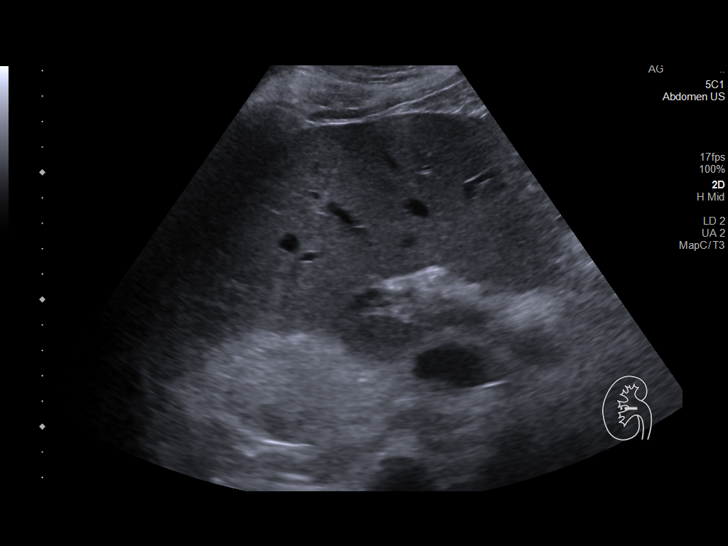
[im 15/43]
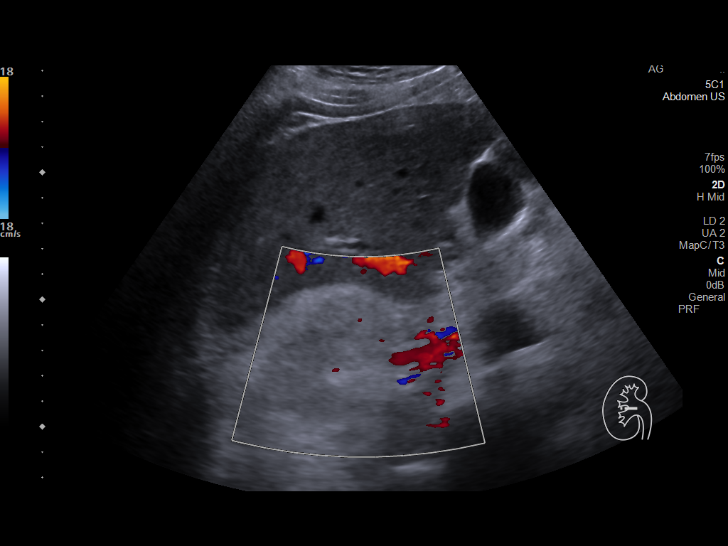
[im 16/43]
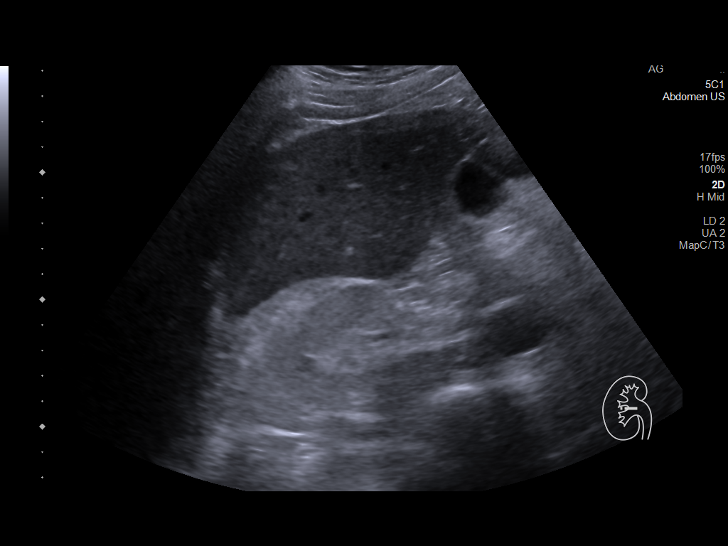
[im 20/43]
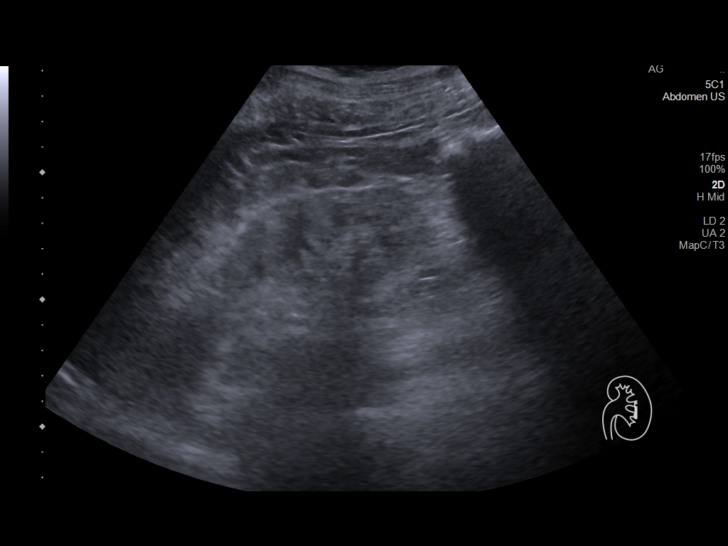
[im 23/43]
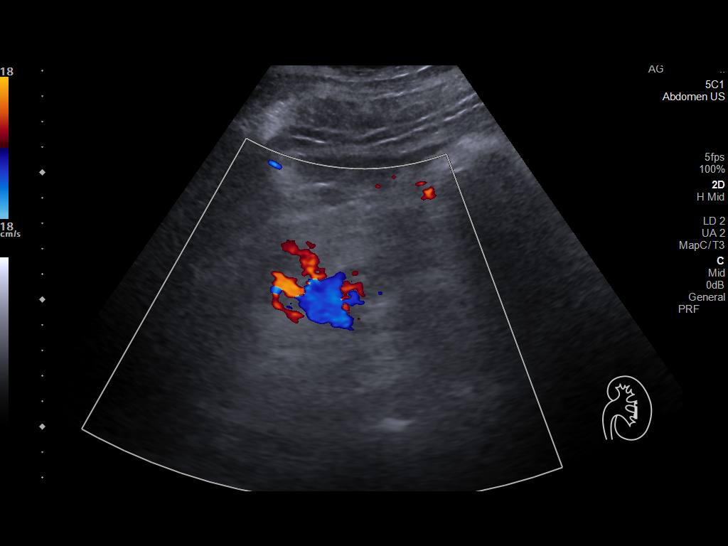
[im 27/43]
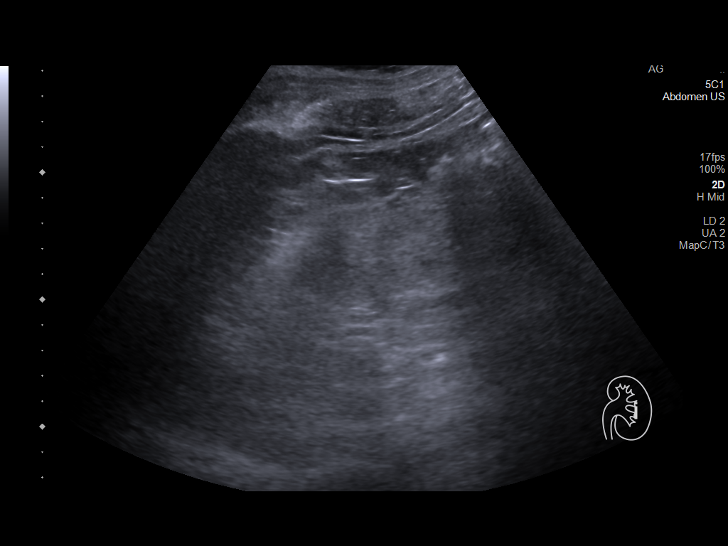
[im 29/43]
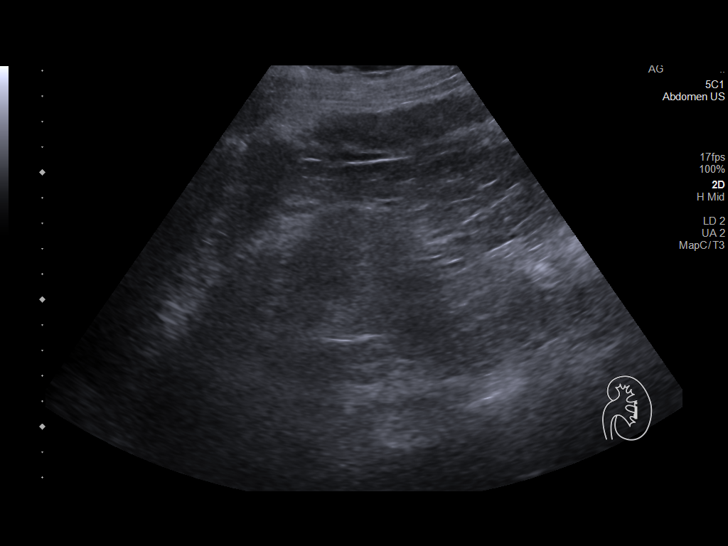
[im 32/43]
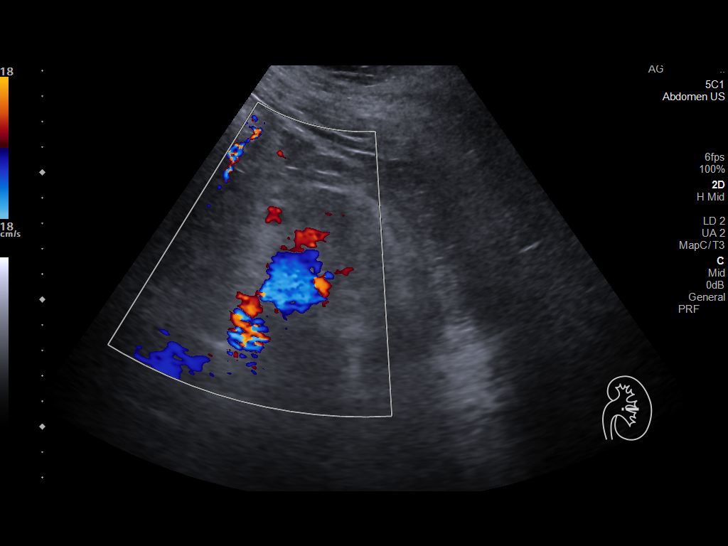
[im 36/43]
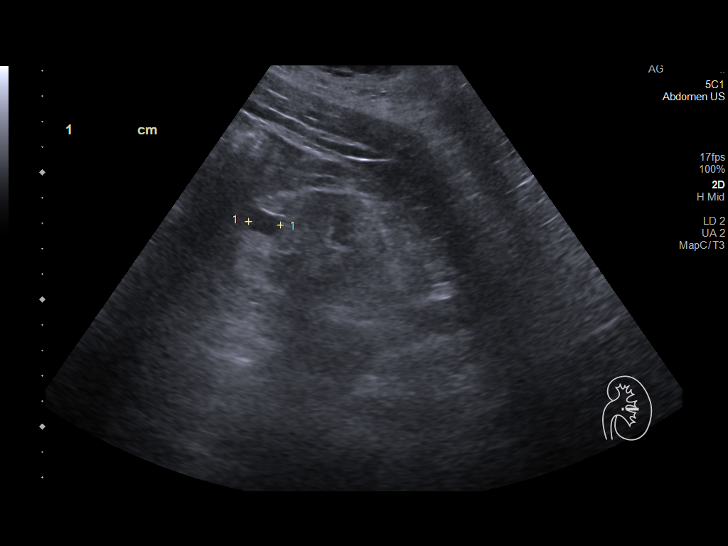
[im 39/43]
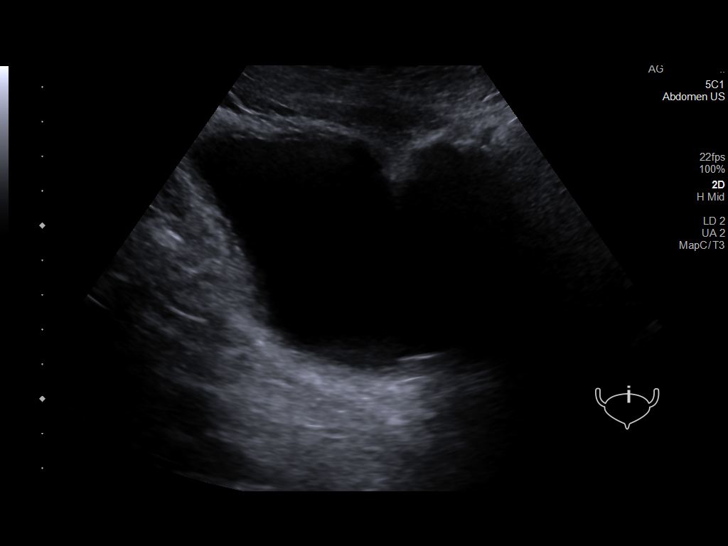
[im 43/43]
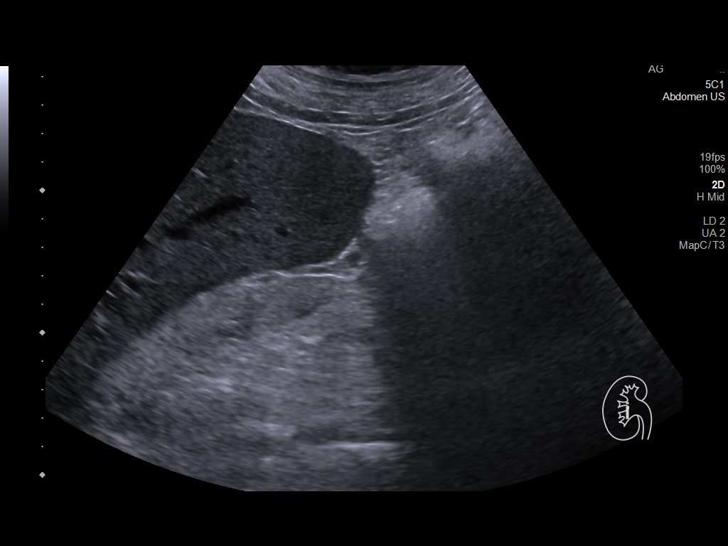

[14 of 25 positions shown; findings below may reference images not displayed]

FINDINGS: Right Kidney:

Renal measurements: 10.6 x 5 x 6.4 cm = volume: 180 mL. Increased
echogenicity. No hydronephrosis or mass

Left Kidney:

Renal measurements: 13 x 5 x 5 cm = volume: 175 mL. Increased
echogenicity. 13 mm incidental cyst. No evidence of solid mass.

Bladder:

Appears normal for degree of bladder distention.
IMPRESSION: Medical renal disease.

No hydronephrosis or reversible finding.
